# Patient Record
Sex: Male | Born: 1980
Health system: Southern US, Community
[De-identification: ages and names within clinical notes are randomized; demographics above are authoritative.]

## PROBLEM LIST (undated history)

## (undated) DIAGNOSIS — B2790 Infectious mononucleosis, unspecified without complication: Secondary | ICD-10-CM

## (undated) DIAGNOSIS — F430 Acute stress reaction: Secondary | ICD-10-CM

## (undated) DIAGNOSIS — R251 Tremor, unspecified: Secondary | ICD-10-CM

## (undated) DIAGNOSIS — M6289 Other specified disorders of muscle: Secondary | ICD-10-CM

## (undated) DIAGNOSIS — E663 Overweight: Secondary | ICD-10-CM

## (undated) DIAGNOSIS — I493 Ventricular premature depolarization: Secondary | ICD-10-CM

## (undated) DIAGNOSIS — R739 Hyperglycemia, unspecified: Secondary | ICD-10-CM

## (undated) DIAGNOSIS — K4090 Unilateral inguinal hernia, without obstruction or gangrene, not specified as recurrent: Secondary | ICD-10-CM

## (undated) DIAGNOSIS — E782 Mixed hyperlipidemia: Secondary | ICD-10-CM

## (undated) DIAGNOSIS — F411 Generalized anxiety disorder: Secondary | ICD-10-CM

## (undated) DIAGNOSIS — K5900 Constipation, unspecified: Secondary | ICD-10-CM

## (undated) DIAGNOSIS — R161 Splenomegaly, not elsewhere classified: Secondary | ICD-10-CM

## (undated) DIAGNOSIS — M545 Low back pain: Secondary | ICD-10-CM

## (undated) HISTORY — DX: Splenomegaly, not elsewhere classified: R16.1

## (undated) HISTORY — DX: Mixed hyperlipidemia: E78.2

## (undated) HISTORY — PX: NO PAST SURGERIES: SHX2092

## (undated) HISTORY — DX: Low back pain: M54.5

## (undated) HISTORY — DX: Overweight: E66.3

## (undated) HISTORY — DX: Infectious mononucleosis, unspecified without complication: B27.90

## (undated) HISTORY — DX: Hyperglycemia, unspecified: R73.9

## (undated) HISTORY — DX: Unilateral inguinal hernia, without obstruction or gangrene, not specified as recurrent: K40.90

## (undated) HISTORY — DX: Tremor, unspecified: R25.1

## (undated) HISTORY — DX: Acute stress reaction: F43.0

## (undated) HISTORY — DX: Generalized anxiety disorder: F41.1

## (undated) HISTORY — DX: Constipation, unspecified: K59.00

---
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
## 2005-04-22 ENCOUNTER — Encounter: Admission: RE | Admit: 2005-04-22 | Discharge: 2005-04-22 | Payer: Self-pay | Admitting: Gastroenterology

## 2009-02-21 ENCOUNTER — Emergency Department (HOSPITAL_BASED_OUTPATIENT_CLINIC_OR_DEPARTMENT_OTHER): Admission: EM | Admit: 2009-02-21 | Discharge: 2009-02-21 | Payer: Self-pay | Admitting: Emergency Medicine

## 2009-07-14 ENCOUNTER — Emergency Department (HOSPITAL_BASED_OUTPATIENT_CLINIC_OR_DEPARTMENT_OTHER)
Admission: EM | Admit: 2009-07-14 | Discharge: 2009-07-14 | Payer: Self-pay | Source: Home / Self Care | Admitting: Emergency Medicine

## 2009-07-14 ENCOUNTER — Ambulatory Visit: Payer: Self-pay | Admitting: Diagnostic Radiology

## 2010-03-25 ENCOUNTER — Encounter: Payer: Self-pay | Admitting: Family

## 2010-03-27 ENCOUNTER — Emergency Department (HOSPITAL_BASED_OUTPATIENT_CLINIC_OR_DEPARTMENT_OTHER)
Admission: EM | Admit: 2010-03-27 | Discharge: 2010-03-27 | Payer: Self-pay | Source: Home / Self Care | Admitting: Emergency Medicine

## 2010-04-09 ENCOUNTER — Ambulatory Visit
Admission: RE | Admit: 2010-04-09 | Discharge: 2010-04-09 | Payer: Self-pay | Source: Home / Self Care | Attending: Family | Admitting: Family

## 2010-04-09 ENCOUNTER — Encounter: Payer: Self-pay | Admitting: Family

## 2010-04-09 DIAGNOSIS — M6289 Other specified disorders of muscle: Secondary | ICD-10-CM | POA: Insufficient documentation

## 2010-04-09 DIAGNOSIS — N411 Chronic prostatitis: Secondary | ICD-10-CM | POA: Insufficient documentation

## 2010-04-09 DIAGNOSIS — R030 Elevated blood-pressure reading, without diagnosis of hypertension: Secondary | ICD-10-CM | POA: Insufficient documentation

## 2010-04-09 DIAGNOSIS — I4949 Other premature depolarization: Secondary | ICD-10-CM | POA: Insufficient documentation

## 2010-04-17 NOTE — Assessment & Plan Note (Signed)
Summary: new to est/fed bcbs/pkg/ss--rm 5   Vital Signs:  Patient profile:   30 year old male Height:      67.5 inches Weight:      173.50 pounds BMI:     26.87 Temp:     97.9 degrees F oral Pulse rate:   84 / minute Pulse rhythm:   regular Resp:     20 per minute BP sitting:   124 / 96  (right arm) Cuff size:   regular  Vitals Entered By: Mervin Kung CMA Duncan Dull) (April 09, 2010 1:47 PM) CC: Pt new to establish care. , Hypertension Management Is Patient Diabetic? No Pain Assessment Patient in pain? yes        CC:  Pt new to establish care.  and Hypertension Management.  History of Present Illness: Jared Myers is a 30 year old male who presents today to establish care.  Was seen by Dr. Cyndia Diver at [facility]. He has several issues that he wishes to discuss.  1) Chronic Prostatitis- Has followed with Urology- initially saw [facility].  Then went to [facility].  Continues to have pain, pain during intercourse.  Pain starts in the left testicle, then radiates to the right.  +dysuria.   Had cystoscopy and told that he may have had a kidney stone.  Had a CT scan which noted cyst on his kidney.  Has had intermittent fever and chills.  Initial prostatitis was in 2007, then again this past spring.  Has been on cipro, and bactrim, doxycycline.  Currently on avodart/cipro. Has upcoming appointment at [facility] tomorrow which he is looking forward to.  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     2) HA's-  have improved since last year.  Reports that these headaches are tension like.    3) Heart Arrythmia-  reports + history of PVC's.  Sometimes feels associated palpitations.  He has seen cardiology in the past.    4) Elevated blood pressures-  this has been in the last few weeks.  Last week was 149/100.    [facility] records were reviewed today.  Hypertension History:      Negative major cardiovascular risk factors  include male age less than 81 years old and non-tobacco-user status.     Preventive Screening-Counseling & Management  Alcohol-Tobacco     Alcohol drinks/day: 0     Smoking Status: never  Caffeine-Diet-Exercise     Caffeine use/day: 2 glasses daily     Does Patient Exercise: yes     Type of exercise: treadmill, elliptical     Times/week: 3      Drug Use:  no.    Allergies (verified): 1)  ! 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 Resp: recent episode of bronchitis CV:  Denies Chest Pain GI:  Denies nausea or vomitting or diarrhea GU: see HPI Lymphatic: Denies lymphadenopathy Musculoskeletal: Some hip pain due to one hip being higher than the other Skin:  Denies Rashes or concerning lesions Psychiatric: Denies depression  Neuro: Denies numbness     Physical Exam  General:  Well-developed,well-nourished,in no acute distress; alert,appropriate and cooperative throughout examination Head:  Normocephalic and atraumatic without obvious abnormalities. No apparent alopecia or balding. Neck:  No deformities, masses, or tenderness noted. Lungs:  Normal respiratory effort, chest expands  symmetrically. Lungs are clear to auscultation, no crackles or wheezes. Heart:  Normal rate and regular rhythm. S1 and S2 normal without gallop, murmur, click, rub or other extra sounds. Prostate:  deferred to Urology Extremities:  no peripheral edema Psych:  Cognition and judgment appear intact. Alert and cooperative with normal attention span and concentration. No apparent delusions, illusions, hallucinations   Impression & Recommendations:  Problem # 1:  PROSTATITIS, CHRONIC (ICD-601.1) Assessment Unchanged This has been a chronic issue for this patient and he is appropriately frustrated with his ongoing symptoms.  He has an appointment scheduled to see Urology at [facility].  Will defer management to Urology.  Problem # 2:  ELEVATED BLOOD PRESSURE WITHOUT DIAGNOSIS OF HYPERTENSION (ICD-796.2) Assessment: New Patient was counseled on importance of a low sodium diet and hand outs were provided.  Plan to repeat in 1 month.  If still elevated, will initiate med. therapy.   Problem # 3:  PREMATURE VENTRICULAR CONTRACTIONS, FREQUENT (ICD-427.69) Assessment: Comment Only Reports that he has been evaluated by cardiology in the past.  EKG today shows NSR. Monitor.  Complete Medication List: 1)  Avodart 0.5 Mg Caps (Dutasteride) .... Take 1 capsule by mouth once a day. 2)  Cipro 500 Mg Tabs (Ciprofloxacin hcl) .... Take 1 tablet by mouth two times a day. 3)  Benzonatate 100 Mg Caps (Benzonatate) .... Take 1 capsule by mouth three times a day. 4)  Daily Multi Tabs (Multiple vitamins-minerals) .... Take 1 tablet by mouth once a day. 5)  Saw Palmetto Xtra Caps (Misc natural products) .... Take 1 capsule by mouth once a day.  Other Orders: EKG w/ Interpretation (93000)  Hypertension Assessment/Plan:      The patient's hypertensive risk group is category A: No risk factors and no target organ damage.  Today's blood pressure is 124/96.    Patient Instructions: 1)  Please limit your  sodium intake. 2)  Please follow up in 1 month for a BP check and complete physical. Come fasting to this appointment.    Orders Added: 1)  EKG w/ Interpretation [93000] 2)  New Patient Level III [99203]   Immunization History:  Tetanus/Td Immunization History:    Tetanus/Td:  tdap (07/12/2009)   Immunization History:  Tetanus/Td Immunization History:    Tetanus/Td:  Tdap (07/12/2009)  Current Allergies (reviewed today): ! [facility]   Preventive Care Screening     Pt unsure of last tetanus shot. Nicki Guadalajara Fergerson CMA Duncan Dull)  April 09, 2010 2:02 PM

## 2010-04-25 NOTE — Letter (Signed)
Summary: Records from [facility]  Records from [facility]   Imported By: Maryln Gottron 04/18/2010 10:27:23  _____________________________________________________________________  External Attachment:    Type:   Image     Comment:   External Document

## 2010-05-08 ENCOUNTER — Ambulatory Visit: Payer: Self-pay | Admitting: Family

## 2010-05-10 ENCOUNTER — Encounter: Payer: Self-pay | Admitting: Family

## 2010-05-10 ENCOUNTER — Ambulatory Visit (INDEPENDENT_AMBULATORY_CARE_PROVIDER_SITE_OTHER): Payer: Federal, State, Local not specified - PPO | Admitting: Family

## 2010-05-10 DIAGNOSIS — R29818 Other symptoms and signs involving the nervous system: Secondary | ICD-10-CM | POA: Insufficient documentation

## 2010-05-10 DIAGNOSIS — IMO0002 Reserved for concepts with insufficient information to code with codable children: Secondary | ICD-10-CM

## 2010-05-10 DIAGNOSIS — R030 Elevated blood-pressure reading, without diagnosis of hypertension: Secondary | ICD-10-CM

## 2010-05-13 ENCOUNTER — Encounter: Payer: Self-pay | Admitting: Family

## 2010-05-13 ENCOUNTER — Telehealth: Payer: Self-pay | Admitting: Family

## 2010-05-13 ENCOUNTER — Encounter (INDEPENDENT_AMBULATORY_CARE_PROVIDER_SITE_OTHER): Payer: Federal, State, Local not specified - PPO | Admitting: Family

## 2010-05-13 DIAGNOSIS — Z0000 Encounter for general adult medical examination without abnormal findings: Secondary | ICD-10-CM

## 2010-05-13 DIAGNOSIS — J029 Acute pharyngitis, unspecified: Secondary | ICD-10-CM | POA: Insufficient documentation

## 2010-05-13 DIAGNOSIS — R030 Elevated blood-pressure reading, without diagnosis of hypertension: Secondary | ICD-10-CM

## 2010-05-13 LAB — CONVERTED CEMR LAB: Rapid Strep: NEGATIVE

## 2010-05-16 NOTE — Assessment & Plan Note (Signed)
Summary: 1 month follow up/mhf--rm 5   Vital Signs:  Patient profile:   30 year old male Height:      67.5 inches Weight:      175 pounds BMI:     27.10 Temp:     98.3 degrees F oral Pulse rate:   96 / minute Pulse rhythm:   regular Resp:     18 per minute BP sitting:   128 / 80  (right arm) Cuff size:   large  Vitals Entered By: Mervin Kung CMA Duncan Dull) (May 10, 2010 3:35 PM) CC: Pt here for 1 month follow up. Has noticed some epigastric burning since this morning. Is Patient Diabetic? No Pain Assessment Patient in pain? no      Comments Pt states all meds have been discontinued. Pt diagnosed with pelvic floor dysfunction. Nicki Guadalajara Fergerson CMA Duncan Dull)  May 10, 2010 3:40 PM    CC:  Pt here for 1 month follow up. Has noticed some epigastric burning since this morning.Marland Kitchen  History of Present Illness: Mr.  Delph is a 30 year old male who presents today for follow up of his blood pressure.  He also complains of some right sided chest pain today.  1. HTN-  still eating fast food. No formal exercise, but has been rollerskating regularly.   2. Prostatitis- told by Dr.  Cherly Anderson at [facility] (urology), that he does not have prostatitis, but instead has pelvic floor dysfunction- all meds stopped.   Was told to take hot baths and breathing exercises.  Has f/u to re-evaluate stream of urine.   3. right upper chest tenderness, tender on both sides.   2-3/10 burning pain. Started this AM.   Denies associates sob, nausea.  Allergies: 1)  ! 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 Lungs are clear to auscultation, no crackles or wheezes. Heart:  Normal rate and regular rhythm. S1 and S2 normal without gallop, murmur, click, rub or other extra sounds. Psych:  Cognition and judgment appear intact. Alert and cooperative with normal attention span and concentration. No apparent delusions, illusions, hallucinations   Impression & Recommendations:  Problem # 1:  ELEVATED BLOOD PRESSURE WITHOUT DIAGNOSIS OF HYPERTENSION (ICD-796.2) Assessment Improved Pt was counseled on healthy diet and exercise.  Monitor. BP today: 128/80 Prior BP: 124/96 (04/09/2010)  Prior 10 Yr Risk Heart Disease: Not enough information (04/09/2010)  Instructed in low sodium diet (DASH Handout) and behavior modification.    Problem # 2:  GENITOURINARY DISORDER (ICD-599.9) Assessment: Improved Pelvic floor dysfunction per urology.  Defer management to Dr. Gala Lewandowsky.  Problem # 3:  MUSCULOSKELETAL PAIN (ICD-781.99) Assessment: New Suspect muscle strain, recommended short term NSAIDS.  Patient Instructions: 1)  Please schedule an appointment at your convenience for a complete physical.   2)  Take 400-600mg  of Ibuprofen (Advil, Motrin) with food every 4-6 hours as needed for relief of pain.   Orders Added: 1)  Est. Patient Level III [16109]     Current Allergies (reviewed  today): ! [facility]

## 2010-05-21 NOTE — Progress Notes (Signed)
----   Converted from flag ---- ---- 05/13/2010 11:07 AM, Mervin Kung CMA (AAMA) wrote: Order entered and faxed to the lab.  ---- 05/13/2010 9:17 AM, Lemont Fillers FNP wrote: could you pls send the following orders to the lab- he will return fasting; CBC, BMET, LFT, FLP, TSH (v70) ------------------------------

## 2010-05-21 NOTE — Assessment & Plan Note (Signed)
Summary: CPE/MHF--rm 5   Vital Signs:  Patient profile:   30 year old male Height:      67.5 inches Weight:      174.75 pounds BMI:     27.06 Temp:     97.9 degrees F oral Pulse rate:   66 / minute Pulse rhythm:   regular Resp:     16 per minute BP sitting:   120 / 90  (right arm) Cuff size:   large  Vitals Entered By: Mervin Kung CMA Duncan Dull) (May 13, 2010 8:56 AM) CC: Pt here for physical; non fasting. Pt also has sore throat x 2 days. Is Patient Diabetic? No Pain Assessment Patient in pain? no        Primary Care Provider:  Lemont Fillers FNP  CC:  Pt here for physical; non fasting. Pt also has sore throat x 2 days.Marland Kitchen  History of Present Illness: Jared Myers is a 30 year old male who presents today for a complete physical.    Preventative- tetanus last spring.  Rollerskates 2x a week, also started using a treadmill.  Diet is improving.    Sore throat- Denies associated fever.  Started yesterday AM, denies associated runny nose.  Having pelvic pain.  Pelvic floor dysfunction-  looking into looking into a therapist who specializes in treating pelvic floor dysfunction.  Notes that today he is experiencing more pain than usual.    Preventive Screening-Counseling & Management  Alcohol-Tobacco     Alcohol drinks/day: 0     Smoking Status: never  Caffeine-Diet-Exercise     Caffeine use/day: 2 glasses daily     Does Patient Exercise: yes     Type of exercise: treadmill, elliptical     Times/week: 3  Allergies: 1)  ! 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 Mother-- living; hx of non  hogkins lymphoma diagnosed at age 52 and breast cancer age 52 Father-- alive and well 1 sister-- depression 1/2  sister-- brain aneurysm  Social History: Reviewed history from 04/09/2010 and no changes required. 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, no children Never Smoked Alcohol use-no Drug use-no Regular exercise-yes  Review of Systems       Constitutional: Denies Fever ENT:  Denies nasal congestion, + sore throat Resp: +mild  cough CV:  Denies Chest Pain GI:  Denies nausea or vomitting GU: + pelvic pain in the prostate, mile burning with urination Lymphatic: Denies lymphadenopathy Musculoskeletal:  Denies muscle/joint pain Skin:  Denies Rashes Psychiatric: Denies depression or anxiety Neuro: Denies numbness     Physical Exam  General:  Well-developed,well-nourished,in no acute distress; alert,appropriate and cooperative throughout examination Head:  Normocephalic and atraumatic without obvious abnormalities. No apparent alopecia or balding. Eyes:  PERRLA, sclera are clear Ears:  External ear exam shows no significant lesions or deformities.  Otoscopic examination reveals clear canals, tympanic membranes are intact bilaterally without bulging, retraction, inflammation or discharge. Hearing is grossly normal bilaterally. Mouth:  Oral mucosa and oropharynx without lesions or exudates.  Mild pharyngeal erythema. Neck:  No deformities, masses, or tenderness noted. Chest Wall:  No deformities, masses, tenderness or gynecomastia noted. Lungs:  Normal respiratory effort, chest expands symmetrically. Lungs are  clear to auscultation, no crackles or wheezes. Heart:  Normal rate and regular rhythm. S1 and S2 normal without gallop, murmur, click, rub or other extra sounds. Abdomen:  Bowel sounds positive,abdomen soft and non-tender without masses, organomegaly or hernias noted. Genitalia:  No palpable inguinal hernias, No penis lesions or urethral  discharge. Prostate:  deferred to urology Msk:  No deformity or scoliosis noted of thoracic or lumbar spine.   Extremities:  No clubbing, cyanosis, edema, or deformity noted with normal full range of motion of all joints.   Neurologic:  No cranial nerve deficits noted. Station and gait are normal. Plantar reflexes are down-going bilaterally. DTRs are symmetrical throughout. Sensory, motor and coordinative functions appear intact. Skin:  Intact without suspicious lesions or rashes Cervical Nodes:  No lymphadenopathy noted Psych:  Cognition and judgment appear intact. Alert and cooperative with normal attention span and concentration. No apparent delusions, illusions, hallucinations   Impression & Recommendations:  Problem # 1:  Preventive Health Care (ICD-V70.0) Assessment Comment Only Immunizations reviewed and up to date.  Pt counseled on diet and exercise.  Pt will return fasting to the lab for blood work this week.   Problem # 2:  ELEVATED BLOOD PRESSURE WITHOUT DIAGNOSIS OF HYPERTENSION (ICD-796.2) Assessment: Comment Only DBP up a bit today, however he is having pelvic pain.  Will monitor.    Problem # 3:  PHARYNGITIS, VIRAL (ICD-462) Assessment: New Rapid strep is negative.  Recommended supportive measures as outlined in pt instructions.  Other Orders: Rapid Strep (16109)  Patient Instructions: 1)  Gargle twice daily with salt water. 2)  Take Tylenol 650mg  every 6 hours as needed for pain 3)  You may use over the counter Cepacol lozenges or Chloraseptic spray as needed for sore throat. 4)  Call if your symptoms worsen or if they do not improve.  5)  Please return to the lab fasting this week to complete your fasting blood work.    Orders Added: 1)  Rapid Strep [60454] 2)  Est. Patient 18-39 years [99395] 3)  Est. Patient Level III [09811]    Current Allergies (reviewed today): ! [facility]   Laboratory Results   Date/Time Reported: Mervin Kung CMA Duncan Dull)   May 13, 2010 9:18 AM   Other Tests  Rapid Strep: negative  Kit Test Internal QC: Positive   (Normal Range: Negative)  Appended Document: lab order    Clinical Lists Changes  Orders: Added new Test order of T-Basic Metabolic Panel [phone]) - Signed Added new Test order of T-CBC w/Diff [phone]) - Signed Added new Test order of T-Hepatic Function [phone]) - Signed Added new Test order of T-Lipid Profile [phone]) - Signed Added new Test order of T-TSH ([id number]) - Signed

## 2010-06-11 LAB — URINALYSIS, ROUTINE W REFLEX MICROSCOPIC
Bilirubin Urine: NEGATIVE
Glucose, UA: NEGATIVE mg/dL
Hgb urine dipstick: NEGATIVE
Ketones, ur: NEGATIVE mg/dL
Nitrite: NEGATIVE
Protein, ur: NEGATIVE mg/dL
Specific Gravity, Urine: 1.025 (ref 1.005–1.030)
Urobilinogen, UA: 0.2 mg/dL (ref 0.0–1.0)
pH: 5 (ref 5.0–8.0)

## 2010-06-21 ENCOUNTER — Emergency Department (HOSPITAL_BASED_OUTPATIENT_CLINIC_OR_DEPARTMENT_OTHER)
Admission: EM | Admit: 2010-06-21 | Discharge: 2010-06-21 | Disposition: A | Discharge status: Home / Self Care | Payer: Federal, State, Local not specified - PPO | Attending: Emergency Medicine | Admitting: Emergency Medicine

## 2010-06-21 ENCOUNTER — Telehealth: Payer: Self-pay | Admitting: *Deleted

## 2010-06-21 DIAGNOSIS — I10 Essential (primary) hypertension: Secondary | ICD-10-CM | POA: Insufficient documentation

## 2010-06-21 DIAGNOSIS — R0789 Other chest pain: Secondary | ICD-10-CM | POA: Insufficient documentation

## 2010-06-21 LAB — BASIC METABOLIC PANEL
CO2: 26 mEq/L (ref 19–32)
Calcium: 9.5 mg/dL (ref 8.4–10.5)
GFR calc Af Amer: >60 mL/min (ref >60)
Glucose, Bld: 96 mg/dL (ref 70–99)
Potassium: 4 mEq/L (ref 3.5–5.1)
Sodium: 144 mEq/L (ref 135–145)

## 2010-06-21 NOTE — Telephone Encounter (Signed)
Received call from pt stating he has been under a lot of stress at work this week. Started having chest pain last night. Has chest pain today with shortness of breath, dizziness and numbness and tingling in his left arm. Pt wanted appt to be seen in the office. Advised pt he should be evaluated in the ER immediately. Pt voices understanding.

## 2010-07-22 ENCOUNTER — Encounter: Payer: Self-pay | Admitting: Family

## 2010-07-22 ENCOUNTER — Ambulatory Visit (HOSPITAL_BASED_OUTPATIENT_CLINIC_OR_DEPARTMENT_OTHER)
Admission: RE | Admit: 2010-07-22 | Discharge: 2010-07-22 | Disposition: A | Discharge status: Home / Self Care | Payer: Federal, State, Local not specified - PPO | Source: Ambulatory Visit | Attending: Family | Admitting: Family

## 2010-07-22 ENCOUNTER — Ambulatory Visit (INDEPENDENT_AMBULATORY_CARE_PROVIDER_SITE_OTHER): Payer: Federal, State, Local not specified - PPO | Admitting: Family

## 2010-07-22 DIAGNOSIS — R109 Unspecified abdominal pain: Secondary | ICD-10-CM | POA: Insufficient documentation

## 2010-07-22 DIAGNOSIS — R030 Elevated blood-pressure reading, without diagnosis of hypertension: Secondary | ICD-10-CM

## 2010-07-22 MED ORDER — IOHEXOL 300 MG/ML  SOLN
100.0000 mL | Freq: Once | INTRAMUSCULAR | Status: AC | PRN
  Administered 2010-07-22: 100 mL via INTRAVENOUS

## 2010-07-22 NOTE — Assessment & Plan Note (Addendum)
BP Readings from Last 3 Encounters:  07/22/10 130/90  05/13/10 120/90  05/10/10 128/80   Pt's bp today is acceptable.  Will continue to monitor.

## 2010-07-22 NOTE — Patient Instructions (Signed)
Please complete your CT downstairs.  We will contact you with the results.

## 2010-07-22 NOTE — Progress Notes (Signed)
  Subjective:    Patient ID: Jared Myers, male    DOB: 05/19/1980, 30 y.o.   MRN: [id number]  HPI  Mr.  Myers is a 30 yr old male who presents today with cc of left lower abdominal pain.  Pain starts in the left lower and radiates downward.  This pain has been present for months.   He continues to roller skate.  He notes a "tiny knot" on his left lower abdomen.  Pain is made worse by standing.  Pain is improved by sitting/laying.  Denies associated nausea or vomitting or constipation.  He continues PT for pelvic floor dysfunction.  Review of Systems See HPI  No past medical history on file.  History   Social History  . Marital Status: Married    Spouse Name: N/A    Number of Children: N/A  . Years of Education: N/A   Occupational History  . Not on file.   Social History Main Topics  . Smoking status: Never Smoker   . Smokeless tobacco: Not on file  . Alcohol Use: Not on file  . Drug Use: Not on file  . Sexually Active: Not on file   Other Topics Concern  . Not on file   Social History Narrative  . No narrative on file    No past surgical history on file.  No family history on file.  Allergies  Allergen Reactions  . Guaifenesin     REACTION: PVCs    No current outpatient prescriptions on file prior to visit.    BP 130/90  Pulse 72  Temp(Src) 97.9 F (36.6 C) (Oral)  Resp 16  Ht 5' 7.5" (1.715 m)  Wt 171 lb 1.3 oz (77.601 kg)  BMI 26.40 kg/m2       Objective:   Physical Exam Gen:  Awake, alert, NAD Abd: soft, non-distended.  + tenderness to palpation periumbilical and RLQ.   GU- ? Direct hernia right, (L side is normal) no palpable indirect hernias        Assessment & Plan:

## 2010-07-22 NOTE — Assessment & Plan Note (Addendum)
Diffuse abdominal pain, ? Hernia on right.  Reviewed radiology records at [facility]- last CT on record was back in 2007.  Will have patient complete a CT abdomen and pelvis to further evaluate.  Surgery referral if + hernia.

## 2010-07-23 ENCOUNTER — Telehealth: Payer: Self-pay | Admitting: *Deleted

## 2010-07-23 NOTE — Telephone Encounter (Signed)
Spoke with patient, reviewed results of CT- subtle asymmetric increased fat within the right inguinal canal which may indicate a small indirect inguinal hernia.  This finding seems disproportionate to his pain which is periumbilical and left sided.  He is not currently experiencing any right groin pain.  He wishes to hold off on any surgical referral at this time which I think is reasonable.  He will let me know if he develops right sided groin pain or if he changes his mind.  I told him that his nausea is likely related to the contrast and should subside in the next 24 hours.  He will contact us if symptoms do not improve.

## 2010-07-23 NOTE — Telephone Encounter (Signed)
Pt states he has been nauseated since his CT scan yesterday. Was not having this before the scan. Wants to know if this is a normal reaction to the Oral contrast or the IV contrast? Reports that he still has nausea today. Please advise.

## 2010-09-23 ENCOUNTER — Encounter: Payer: Self-pay | Admitting: Family

## 2010-09-23 ENCOUNTER — Ambulatory Visit (INDEPENDENT_AMBULATORY_CARE_PROVIDER_SITE_OTHER): Payer: Federal, State, Local not specified - PPO | Admitting: Family

## 2010-09-23 DIAGNOSIS — K1370 Unspecified lesions of oral mucosa: Secondary | ICD-10-CM

## 2010-09-23 DIAGNOSIS — K121 Other forms of stomatitis: Secondary | ICD-10-CM

## 2010-09-23 NOTE — Assessment & Plan Note (Signed)
Small oral ulcer- likely viral in etiology.  Ears and throat look good. Conservative measures planned as outlined in pt instructions.

## 2010-09-23 NOTE — Progress Notes (Signed)
  Subjective:    Patient ID: Jared Myers, male    DOB: [birth year], 30 y.o.   MRN: [id number]  HPI  [name] is a 30 yr old male who presents today with chief complaint of sore on his tongue and some left sided ear pain.  Reports recent exposure to sick nephew who had viral illness. Wife has been sick- had whooping cough 3 weeks ago.  Notes + ulcer on the side of his tongue.  + associate pain into the left ear.  Denies associated fever or nasal congestion.  Has not tired any otc meds.    Review of Systems See HPI No past medical history on file.  History   Social History  . Marital Status: Married    Spouse Name: N/A    Number of Children: N/A  . Years of Education: N/A   Occupational History  . Not on file.   Social History Main Topics  . Smoking status: Never Smoker   . Smokeless tobacco: Not on file  . Alcohol Use: Not on file  . Drug Use: Not on file  . Sexually Active: Not on file   Other Topics Concern  . Not on file   Social History Narrative  . No narrative on file    No past surgical history on file.  No family history on file.  Allergies  Allergen Reactions  . Guaifenesin     REACTION: PVCs    No current outpatient prescriptions on file prior to visit.    BP 110/80  Pulse 78  Temp(Src) 98.1 F (36.7 C) (Oral)  Resp 16  Ht 5' 7.5" (1.715 m)  Wt 175 lb 0.6 oz (79.398 kg)  BMI 27.01 kg/m2       Objective:   Physical Exam  Constitutional: He appears well-developed and well-nourished.  HENT:  Head: Normocephalic and atraumatic.  Right Ear: Tympanic membrane and ear canal normal.  Left Ear: Tympanic membrane and ear canal normal.       Small ulcer at back of tongue on left lateral side.  Tonsils without exudates or erythema.   Eyes: Conjunctivae are normal.  Neck: Neck supple. No erythema present. No mass and no thyromegaly present.       No carotid LAD noted   Cardiovascular: Normal rate and regular rhythm.   Pulmonary/Chest: Effort  normal and breath sounds normal.  Psychiatric: He has a normal mood and affect.          Assessment & Plan:

## 2010-09-23 NOTE — Patient Instructions (Signed)
Gargle with salt water twice daily. You may use motrin or chloraseptic spray as needed for throat pain. Call if your symptoms worsen or if they do not improve in the next 48 to 72 hours.

## 2010-11-18 ENCOUNTER — Ambulatory Visit: Payer: Federal, State, Local not specified - PPO | Admitting: Family

## 2010-11-18 ENCOUNTER — Ambulatory Visit (INDEPENDENT_AMBULATORY_CARE_PROVIDER_SITE_OTHER): Payer: Federal, State, Local not specified - PPO | Admitting: Family

## 2010-11-18 ENCOUNTER — Encounter: Payer: Self-pay | Admitting: Family

## 2010-11-18 VITALS — BP 110/80 | HR 66 | Temp 98.0°F | Resp 16 | Ht 67.5 in | Wt 178.0 lb

## 2010-11-18 DIAGNOSIS — Z23 Encounter for immunization: Secondary | ICD-10-CM

## 2010-11-18 DIAGNOSIS — S3992XA Unspecified injury of lower back, initial encounter: Secondary | ICD-10-CM

## 2010-11-18 DIAGNOSIS — IMO0002 Reserved for concepts with insufficient information to code with codable children: Secondary | ICD-10-CM

## 2010-11-18 DIAGNOSIS — R030 Elevated blood-pressure reading, without diagnosis of hypertension: Secondary | ICD-10-CM

## 2010-11-18 NOTE — Patient Instructions (Signed)
Please follow up in the end of March for a physical, sooner if problems or concerns.

## 2010-11-18 NOTE — Progress Notes (Signed)
  Subjective:    Patient ID: Jared Myers, male    DOB: 08-30-1980, 30 y.o.   MRN: [id number]  HPI  Mr.  Auriemma is a 30 yr old male who presents today for follow up of his blood pressure.  Elevated blood pressure- since last visit he notes that he has started running every other day.  In addition, he reports that he has been watching his sodium.  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  He finds this less stressful than his previous assignment.  In addition, he is not required to do as much heavy lifting as before.  As a result his pelvic floor issues have settled down.   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  He did have a fall landing on his tailbone about 6 weeks ago which is overall improving. He has been seeing a chiropractor for this.   Review of Systems See HPI  No past medical history on file.  History   Social History  . Marital Status: Married    Spouse Name: N/A    Number of Children: N/A  . Years of Education: N/A   Occupational History  . Not on file.   Social History Main Topics  . Smoking status: Never Smoker   . Smokeless tobacco: Not on file  . Alcohol Use: Not on file  . Drug Use: Not on file  . Sexually Active: Not on file   Other Topics Concern  . Not on file   Social History Narrative  . No narrative on file    No past surgical history on file.  No family history on file.  Allergies  Allergen Reactions  . Guaifenesin     REACTION: PVCs    No current outpatient prescriptions on file prior to visit.    BP 110/80  Pulse 66  Temp(Src) 98 F (36.7 C) (Oral)  Resp 16  Ht 5' 7.5" (1.715 m)  Wt 178 lb (80.74 kg)  BMI 27.47 kg/m2       Objective:   Physical Exam  Constitutional: He appears well-developed and well-nourished.  Cardiovascular: Normal rate and regular rhythm.   Pulmonary/Chest: Effort normal and breath sounds normal.    Musculoskeletal: He exhibits no edema.       Mild tenderness to palpation of left lower spine in sacral area.           Assessment & Plan:

## 2010-11-19 DIAGNOSIS — S3992XA Unspecified injury of lower back, initial encounter: Secondary | ICD-10-CM | POA: Insufficient documentation

## 2010-11-19 NOTE — Assessment & Plan Note (Signed)
BP is improved, not currently on meds.  Recommended that he continue the low sodium diet and exercise.

## 2010-11-19 NOTE — Assessment & Plan Note (Signed)
Clinically improving.  I offered to xray the area to confirm if fractured, but he declined at this time.  "I'll just let it be."

## 2010-11-22 ENCOUNTER — Ambulatory Visit: Payer: Federal, State, Local not specified - PPO | Admitting: Family

## 2010-11-25 ENCOUNTER — Ambulatory Visit (INDEPENDENT_AMBULATORY_CARE_PROVIDER_SITE_OTHER): Payer: Federal, State, Local not specified - PPO | Admitting: Family

## 2010-11-25 ENCOUNTER — Other Ambulatory Visit: Payer: Self-pay

## 2010-11-25 ENCOUNTER — Emergency Department (HOSPITAL_BASED_OUTPATIENT_CLINIC_OR_DEPARTMENT_OTHER)
Admission: EM | Admit: 2010-11-25 | Discharge: 2010-11-25 | Disposition: A | Discharge status: Home / Self Care | Payer: Federal, State, Local not specified - PPO | Attending: Emergency Medicine | Admitting: Emergency Medicine

## 2010-11-25 ENCOUNTER — Encounter (HOSPITAL_BASED_OUTPATIENT_CLINIC_OR_DEPARTMENT_OTHER): Payer: Self-pay | Admitting: *Deleted

## 2010-11-25 ENCOUNTER — Encounter: Payer: Self-pay | Admitting: Family

## 2010-11-25 DIAGNOSIS — R002 Palpitations: Secondary | ICD-10-CM | POA: Insufficient documentation

## 2010-11-25 DIAGNOSIS — I493 Ventricular premature depolarization: Secondary | ICD-10-CM | POA: Insufficient documentation

## 2010-11-25 DIAGNOSIS — J40 Bronchitis, not specified as acute or chronic: Secondary | ICD-10-CM | POA: Insufficient documentation

## 2010-11-25 DIAGNOSIS — R079 Chest pain, unspecified: Secondary | ICD-10-CM | POA: Insufficient documentation

## 2010-11-25 DIAGNOSIS — IMO0002 Reserved for concepts with insufficient information to code with codable children: Secondary | ICD-10-CM

## 2010-11-25 HISTORY — DX: Ventricular premature depolarization: I49.3

## 2010-11-25 MED ORDER — ONDANSETRON 8 MG PO TBDP
8.0000 mg | ORAL_TABLET | Freq: Once | ORAL | Status: AC
  Administered 2010-11-25: 8 mg via ORAL
  Filled 2010-11-25: qty 1

## 2010-11-25 NOTE — ED Notes (Addendum)
EKG completed and shown to EDP

## 2010-11-25 NOTE — Assessment & Plan Note (Signed)
Patient with history of pelvic floor dysfunction.  He is starting a physical therapy program at [facility] in [place].  Symptoms seem to be made worse by stress.  We discussed that if he does not see improvement with physical therapy that we may consider focusing more on treatment of his stress and anxiety.

## 2010-11-25 NOTE — ED Provider Notes (Addendum)
History     CSN: [id number] Arrival date & time: 11/25/2010 12:41 AM   Chief Complaint  Patient presents with  . Chest Pain  . Palpitations      HPI 30 year old male presents to emergency department with complaint of palpitations and chest tightness tonight around 1045. Patient reports he was lying in the bed when he had onset of the symptoms. Patient reports he's had similar symptoms with frequent PVCs when taking prednisone. Patient was seen earlier today by local urgent care started on a prednisone Dosepak and Cefdinir for symptoms of nasal congestion, sinus pressure for the past week. Patient denies fever cough or purulent drainage. He reports symptoms have resolved since being in the emergency department. Patient denies cardiac history aside from previous history of PVCs, patient is nonsmoker. No leg swelling, no shortness of breath. Patient has some family members with cardiac disease but non-before the age of 8. Patient was told by a nurse line that he needed to get checked out so he came to the emergency department.  Past Medical History  Diagnosis Date  . Premature ventricular beat      History reviewed. No pertinent past surgical history.  History reviewed. No pertinent family history.  History  Substance Use Topics  . Smoking status: Never Smoker   . Smokeless tobacco: Not on file  . Alcohol Use: No      Review of Systems  All other systems reviewed and are negative.    Allergies  Guaifenesin and Prednisone  Home Medications   Current Outpatient Rx  Name Route Sig Dispense Refill  . CEFDINIR 300 MG PO CAPS Oral Take 300 mg by mouth 2 (two) times daily.        Physical Exam    BP 132/81  Pulse 78  Temp(Src) 97.9 F (36.6 C) (Oral)  Resp 18  Wt 175 lb (79.379 kg)  SpO2 100%  Physical Exam  Constitutional: He is oriented to person, place, and time. He appears well-developed and well-nourished. No distress.  HENT:  Head: Normocephalic and  atraumatic.  Right Ear: External ear normal.  Left Ear: External ear normal.  Nose: Nose normal.  Mouth/Throat: Oropharynx is clear and moist. No oropharyngeal exudate.  Eyes: Conjunctivae and EOM are normal. Pupils are equal, round, and reactive to light. Right eye exhibits no discharge. Left eye exhibits no discharge. No scleral icterus.  Neck: Normal range of motion. Neck supple. No JVD present. No tracheal deviation present. No thyromegaly present.  Cardiovascular: Normal rate, regular rhythm, normal heart sounds and intact distal pulses.  Exam reveals no gallop and no friction rub.   No murmur heard. Pulmonary/Chest: Effort normal and breath sounds normal. No stridor. No respiratory distress. He has no wheezes. He has no rales. He exhibits no tenderness.  Abdominal: Soft. Bowel sounds are normal. He exhibits no distension and no mass. There is no tenderness. There is no rebound and no guarding.  Musculoskeletal: Normal range of motion. He exhibits no edema and no tenderness.  Lymphadenopathy:    He has no cervical adenopathy.  Neurological: He is alert and oriented to person, place, and time. Coordination normal.  Skin: Skin is warm and dry. No rash noted. He is not diaphoretic. No erythema. No pallor.  Psychiatric: He has a normal mood and affect.    ED Course  Procedures  Results for orders placed during the hospital encounter of 06/21/10  BASIC METABOLIC PANEL      Component Value Range   Sodium 144  135 - 145 (mEq/L)   Potassium 4.0  3.5 - 5.1 (mEq/L)   Chloride 104  96 - 112 (mEq/L)   CO2 26  19 - 32 (mEq/L)   Glucose, Bld 96  70 - 99 (mg/dL)   BUN 15  6 - 23 (mg/dL)   Creatinine, Ser .9  0.4 - 1.5 (mg/dL)   Calcium 9.5  8.4 - 86.5 (mg/dL)   GFR calc non Af Amer >60  >60 (mL/min)   GFR calc Af Amer    >60 (mL/min)   Value: >60            The eGFR has been calculated     using the MDRD equation.     This calculation has not been     validated in all clinical      situations.     eGFR's persistently     <60 mL/min signify     possible Chronic Kidney Disease.   No results found.   Date: 11/25/2010  Rate: 83  Rhythm: normal sinus rhythm  QRS Axis: normal  Intervals: normal  ST/T Wave abnormalities: normal  Conduction Disutrbances:none  Narrative Interpretation:   Old EKG Reviewed: unchanged 06/21/10   1. Palpitations      MDM 30 year old male with chest tightness and palpitations earlier in the evening since resolved. Patient has been in the department on the monitor for 3 hours without return of symptoms. Exam normal. Patient reports prior history of palpitations while taking prednisone which he had forgotten. Advised patient to stop taking prednisone. Advised as he has started the antibiotics he should finish the course. Patient to follow up with primary care Dr. for worsening condition       Olivia Mackie, MD 11/25/10 7846  Olivia Mackie, MD 01/09/11 [phone]

## 2010-11-25 NOTE — Assessment & Plan Note (Signed)
Improving.  I recommended that he continue cefdinir (continue OFF of prednisone).  Call if symptoms worsen or if they do not continue to improve.  Pt verbalizes understanding.  ED records were reviewed.

## 2010-11-25 NOTE — ED Notes (Signed)
Pt was seen at [facility] today and given abx and prednisone pt forgot that he had a previous reaction to prednisone pt states that prednisone gives him PVC pt began to have left sided CP and nausea after taking 6 tablets tonight.

## 2010-11-25 NOTE — Patient Instructions (Signed)
Call if fever over 101,  If symptoms worsen, or if you are not feeling better in 2-3 days.

## 2010-11-25 NOTE — Progress Notes (Signed)
  Subjective:    Patient ID: Jared Myers, male    DOB: 09/17/1980, 30 y.o.   MRN: [id number]  HPI  Mr.  Myers is a 30 yr old male who presents today for ED follow up.    Was seen yesterday AM at the urgent care at 10:30AM where he was seen for chest congestion and was treated with cefdinir and prednisone.  After taking prednisone he developed chest palpitations.  He called the RN at [facility] and was told to go to the ED.  He was evaluated in the ED at [facility], found to be stable and discharged home.   Today he left work early.  He has some chest "achiness" but notes improvement overall in his cough.  Pelvic pain- he is following at the [facility], the pelvic pain seems to be worsening due to pain.      Review of Systems See HPI  Past Medical History  Diagnosis Date  . Premature ventricular beat     History   Social History  . Marital Status: Married    Spouse Name: N/A    Number of Children: N/A  . Years of Education: N/A   Occupational History  . Not on file.   Social History Main Topics  . Smoking status: Never Smoker   . Smokeless tobacco: Not on file  . Alcohol Use: No  . Drug Use: No  . Sexually Active: Not on file   Other Topics Concern  . Not on file   Social History Narrative  . No narrative on file    No past surgical history on file.  No family history on file.  Allergies  Allergen Reactions  . Guaifenesin     REACTION: PVCs  . Prednisone     PVC    Current Outpatient Prescriptions on File Prior to Visit  Medication Sig Dispense Refill  . cefdinir (OMNICEF) 300 MG capsule Take 300 mg by mouth 2 (two) times daily.         Current Facility-Administered Medications on File Prior to Visit  Medication Dose Route Frequency Provider Last Rate Last Dose  . ondansetron (ZOFRAN-ODT) disintegrating tablet 8 mg  8 mg Oral Once Olivia Mackie, MD   8 mg at 11/25/10 0201    BP 122/80  Pulse 84  Temp(Src) 97.9 F (36.6 C) (Oral)   Resp 16  Ht 5' 7.5" (1.715 m)  Wt 177 lb 0.6 oz (80.305 kg)  BMI 27.32 kg/m2       Objective:   Physical Exam  Constitutional: He is oriented to person, place, and time. He appears well-developed and well-nourished.  Cardiovascular: Normal rate and regular rhythm.   No murmur heard. Pulmonary/Chest: Effort normal and breath sounds normal. No respiratory distress. He has no wheezes. He has no rales. He exhibits no tenderness.  Neurological: He is alert and oriented to person, place, and time.  Skin: Skin is warm and dry.  Psychiatric: He has a normal mood and affect. His behavior is normal. Judgment and thought content normal.          Assessment & Plan:

## 2010-12-25 ENCOUNTER — Encounter: Payer: Self-pay | Admitting: Family

## 2010-12-25 ENCOUNTER — Ambulatory Visit (INDEPENDENT_AMBULATORY_CARE_PROVIDER_SITE_OTHER): Payer: Federal, State, Local not specified - PPO | Admitting: Family

## 2010-12-25 VITALS — BP 120/88 | HR 66 | Temp 97.8°F | Resp 16 | Wt 181.1 lb

## 2010-12-25 DIAGNOSIS — J029 Acute pharyngitis, unspecified: Secondary | ICD-10-CM

## 2010-12-25 NOTE — Assessment & Plan Note (Signed)
Recommended supportive measures.  Rapid strep is negative.

## 2010-12-25 NOTE — Progress Notes (Signed)
Addended by: Mervin Kung A on: 12/25/2010 02:26 PM   Modules accepted: Orders

## 2010-12-25 NOTE — Progress Notes (Signed)
  Subjective:    Patient ID: Jared Myers, male    DOB: Mar 21, 1980, 30 y.o.   MRN: [id number]  HPI  Mr. Mckamie is a 30 yr old male with chief complaint of sore throat. Symptoms started yesterday on the way home from his [address]  Notes mild associated cough, and nausea this AM without vomitting. He does report associated chills but no fever.  He had some sick contacts on the cruise ship. Review of Systems See HPI  Past Medical History  Diagnosis Date  . Premature ventricular beat     History   Social History  . Marital Status: Married    Spouse Name: N/A    Number of Children: N/A  . Years of Education: N/A   Occupational History  . Not on file.   Social History Main Topics  . Smoking status: Never Smoker   . Smokeless tobacco: Not on file  . Alcohol Use: No  . Drug Use: No  . Sexually Active: Not on file   Other Topics Concern  . Not on file   Social History Narrative  . No narrative on file    No past surgical history on file.  No family history on file.  Allergies  Allergen Reactions  . Guaifenesin     REACTION: PVCs  . Prednisone     PVC    Current Outpatient Prescriptions on File Prior to Visit  Medication Sig Dispense Refill  . MAGNESIUM PO Take 2 tablets by mouth daily.        . Multiple Vitamin (MULTIVITAMIN) tablet Take 1 tablet by mouth daily.        Marland Kitchen POTASSIUM PO Take 1 tablet by mouth daily.          BP 120/88  Pulse 66  Temp(Src) 97.8 F (36.6 C) (Oral)  Resp 16  Wt 181 lb 1.3 oz (82.137 kg)       Objective:   Physical Exam  Constitutional: He appears well-developed and well-nourished.  HENT:  Head: Normocephalic and atraumatic.  Right Ear: Tympanic membrane and ear canal normal.  Left Ear: Tympanic membrane and ear canal normal.       Mild pharyngeal erythema, no exudates  Eyes: Conjunctivae are normal. Pupils are equal, round, and reactive to light. No scleral icterus.  Cardiovascular: Normal rate and regular rhythm.    No murmur heard. Pulmonary/Chest: Effort normal and breath sounds normal. No respiratory distress. He has no wheezes. He has no rales.  Psychiatric: He has a normal mood and affect. His behavior is normal. Judgment and thought content normal.          Assessment & Plan:

## 2010-12-25 NOTE — Patient Instructions (Signed)
Viral Pharyngitis  Viral pharyngitis is a sore throat caused by a cold virus. This is not a strep throat. This does not require treatment by an antibiotic (medications that kill germs). It will get better on its own. Antibiotics generally will not help unless this condition is followed by a bacterial (germ) infection.  HOME CARE INSTRUCTIONS   Drink or give your child plenty of fluids. Soft, cold foods such as ice cream, popsicles, or jello may be used.    Gargle with warm salt water (one teaspoon salt per quart of water).    If over age 7, throat lozenges may be used safely.    Only take over-the-counter or prescription medicines for pain, discomfort, or fever as directed by your caregiver. DO NOT USE ASPIRIN.   SEEK MEDICAL CARE IF:   You are better in a few days, then become worse.    You have a fever or pain unresponsive to pain medicines.    There are any other changes that concern you.   Diagnostic tests may have been performed. If you have not received results at time of discharge, receive instructions as how to obtain these results.   Document Released: 12/04/2004 Document Re-Released: 08/13/2007  Exit[employer] Patient Information 2011 [employer].

## 2011-01-03 ENCOUNTER — Ambulatory Visit: Payer: Federal, State, Local not specified - PPO | Admitting: Family

## 2011-01-03 ENCOUNTER — Ambulatory Visit (INDEPENDENT_AMBULATORY_CARE_PROVIDER_SITE_OTHER): Payer: Federal, State, Local not specified - PPO | Admitting: Family

## 2011-01-03 ENCOUNTER — Encounter: Payer: Self-pay | Admitting: *Deleted

## 2011-01-03 ENCOUNTER — Encounter: Payer: Self-pay | Admitting: Family

## 2011-01-03 VITALS — BP 130/102 | HR 72 | Temp 97.8°F | Resp 16 | Wt 179.0 lb

## 2011-01-03 DIAGNOSIS — R0789 Other chest pain: Secondary | ICD-10-CM

## 2011-01-03 DIAGNOSIS — R030 Elevated blood-pressure reading, without diagnosis of hypertension: Secondary | ICD-10-CM

## 2011-01-03 DIAGNOSIS — F411 Generalized anxiety disorder: Secondary | ICD-10-CM

## 2011-01-03 DIAGNOSIS — F419 Anxiety disorder, unspecified: Secondary | ICD-10-CM

## 2011-01-03 NOTE — Progress Notes (Signed)
  Subjective:    Patient ID: Jared Myers, male    DOB: [birth year], 30 y.o.   MRN: [id number]  HPI  [name] present today for evaluation of his blood pressure.  Reports hostile work environment. Has been very stressed at his work.  He went for his physical therapy for his pelvic floor issues.  Notes that his pelvic pain has been worse yesterday and today. He has mild associated tingling left arm.  Mild associated nausea.  Yesterday and today he noted some chest tightness while he was laying in bed.  Felt that his blood pressure was elevated.  He notes some mild tightness at rest.  Not exacerbated by activity.  Has felt shaky.  He went to [facility] ED for BP evaluation and was told that his BP was 141/97.     Maternal GM- CAD MI at age 101.  Maternal uncle MI in his early 78's.  Review of Systems    see HPI  Past Medical History  Diagnosis Date  . Premature ventricular beat     History   Social History  . Marital Status: Married    Spouse Name: N/A    Number of Children: N/A  . Years of Education: N/A   Occupational History  . Not on file.   Social History Main Topics  . Smoking status: Never Smoker   . Smokeless tobacco: Not on file  . Alcohol Use: No  . Drug Use: No  . Sexually Active: Not on file   Other Topics Concern  . Not on file   Social History Narrative  . No narrative on file    No past surgical history on file.  No family history on file.  Allergies  Allergen Reactions  . Guaifenesin     REACTION: PVCs  . Prednisone     PVC    Current Outpatient Prescriptions on File Prior to Visit  Medication Sig Dispense Refill  . MAGNESIUM PO Take 2 tablets by mouth daily.          BP 130/102  Pulse 72  Temp(Src) 97.8 F (36.6 C) (Oral)  Resp 16  Wt 179 lb (81.194 kg)    Objective:   Physical Exam  Constitutional: He appears well-developed and well-nourished. No distress.  Cardiovascular: Normal rate and regular rhythm.   No murmur  heard. Pulmonary/Chest: Effort normal and breath sounds normal. No respiratory distress. He has no wheezes. He has no rales. He exhibits no tenderness.  Musculoskeletal: He exhibits no edema.  Skin: Skin is warm and dry. No erythema.  Psychiatric: He has a normal mood and affect. His behavior is normal. Judgment and thought content normal.          Assessment & Plan:

## 2011-01-03 NOTE — Patient Instructions (Signed)
Go to the ER if you develop worsening chest pain.  You will be contacted about your exercise stress test.   You will be contacted about your referral for to the therapist.   Follow up in 6 weeks.

## 2011-01-06 DIAGNOSIS — F411 Generalized anxiety disorder: Secondary | ICD-10-CM | POA: Insufficient documentation

## 2011-01-06 DIAGNOSIS — R0789 Other chest pain: Secondary | ICD-10-CM | POA: Insufficient documentation

## 2011-01-06 HISTORY — DX: Generalized anxiety disorder: F41.1

## 2011-01-06 NOTE — Assessment & Plan Note (Addendum)
30 yr old male with atypical chest pain.  EKG performed today in office and I have personally reviewed.  NSR without any ischemic changes.   I recommended that he have an exercise stress test which I suspect will be normal.  He is also instructed to go to the ED if he develops worsening chest pain.  He verbalizes understanding.

## 2011-01-06 NOTE — Assessment & Plan Note (Signed)
BP Readings from Last 3 Encounters:  01/03/11 130/102  12/25/10 120/88  11/25/10 122/80  I repeated his BP in the exam room after he had been sitting quietly and got reading of 124/82.  I suspect that stress and anxiety are playing a major role in his symptoms. Monitor for now.

## 2011-01-06 NOTE — Assessment & Plan Note (Signed)
His atypical chest pain and his pelvic floor pain are both exacerbated by his stress and anxiety.  I recommended that he see a therapist to help with stress management and anxiety.  He is agreeable to this.  If symptoms worsen or if no improvement with therapy, then I would consider adding an SRI.

## 2011-01-07 ENCOUNTER — Telehealth: Payer: Self-pay | Admitting: Family

## 2011-01-07 NOTE — Telephone Encounter (Signed)
Message copied by Sandford Craze on Tue Jan 07, 2011  4:47 PM ------      Message from: Eulah Pont      Created: Mon Jan 06, 2011  3:51 PM         Scheduled patient for exercise stress test  Called spoke with patient he can not go now, because of work schedule ,he will call back in a couple weeks,            ----- Message -----         From: Katrinka Blazing. Peggyann Juba, NP         Sent: 01/06/2011   8:23 AM           To: [name],            I placed an order for an exercise stress test.  Could you pls confirm that this gets scheduled with cardiology.            Thanks,            [employer]

## 2011-01-08 ENCOUNTER — Encounter: Payer: Federal, State, Local not specified - PPO | Admitting: Physician Assistant

## 2011-01-09 ENCOUNTER — Telehealth: Payer: Self-pay | Admitting: Family

## 2011-01-09 NOTE — Telephone Encounter (Signed)
Referral to Psychology  , Victorino Dike from [employer] , called pt to schedule appointment   Patient declined appointment

## 2011-02-21 ENCOUNTER — Ambulatory Visit: Payer: Federal, State, Local not specified - PPO | Admitting: Family

## 2011-04-11 ENCOUNTER — Encounter: Payer: Self-pay | Admitting: Family

## 2011-04-11 ENCOUNTER — Ambulatory Visit (INDEPENDENT_AMBULATORY_CARE_PROVIDER_SITE_OTHER): Payer: Federal, State, Local not specified - PPO | Admitting: Family

## 2011-04-11 DIAGNOSIS — H6690 Otitis media, unspecified, unspecified ear: Secondary | ICD-10-CM

## 2011-04-11 DIAGNOSIS — R509 Fever, unspecified: Secondary | ICD-10-CM

## 2011-04-11 DIAGNOSIS — H6692 Otitis media, unspecified, left ear: Secondary | ICD-10-CM

## 2011-04-11 LAB — POCT INFLUENZA A/B
Influenza A, POC: NEGATIVE
Influenza B, POC: NEGATIVE

## 2011-04-11 MED ORDER — AMOXICILLIN-POT CLAVULANATE 875-125 MG PO TABS: 1.0000 | ORAL_TABLET | Freq: Two times a day (BID) | ORAL | Status: DC

## 2011-04-11 NOTE — Progress Notes (Signed)
  Subjective:    Patient ID: Jared Myers, male    DOB: [birth year], 31 y.o.   MRN: [id number]  HPI  Mr.  Kadlec is a 31 yr old male who presents today with several symptoms.  He reports chills, sinus congestion/pressure (milky white nasal discharge), ear pressure L>R and nausea.  He reports that his symptoms started yesterday.  He has had a low grade fever.     Review of Systems See HPI  Past Medical History  Diagnosis Date  . Premature ventricular beat     History   Social History  . Marital Status: Married    Spouse Name: N/A    Number of Children: N/A  . Years of Education: N/A   Occupational History  . Not on file.   Social History Main Topics  . Smoking status: Never Smoker   . Smokeless tobacco: Not on file  . Alcohol Use: No  . Drug Use: No  . Sexually Active: Not on file   Other Topics Concern  . Not on file   Social History Narrative  . No narrative on file    No past surgical history on file.  No family history on file.  Allergies  Allergen Reactions  . Guaifenesin     REACTION: PVCs  . Prednisone     PVC    Current Outpatient Prescriptions on File Prior to Visit  Medication Sig Dispense Refill  . MAGNESIUM PO Take 2 tablets by mouth daily.          BP 130/84  Pulse 112  Temp(Src) 99.6 F (37.6 C) (Oral)  Resp 16  SpO2 99%       Objective:   Physical Exam  Constitutional: He appears well-developed and well-nourished. No distress.  HENT:  Head: Normocephalic and atraumatic.  Right Ear: Tympanic membrane and ear canal normal.  Left Ear: Tympanic membrane is erythematous. Tympanic membrane is not bulging.  Mouth/Throat: Posterior oropharyngeal erythema present. No oropharyngeal exudate.  Cardiovascular: Normal rate and regular rhythm.   No murmur heard. Pulmonary/Chest: Breath sounds normal. No respiratory distress. He has no wheezes. He has no rales. He exhibits no tenderness.          Assessment & Plan:

## 2011-04-11 NOTE — Assessment & Plan Note (Signed)
Will plan to treat with augmentin.  He is instructed to call us if symptoms worsen or if he is not feeling better in 2-3 days. Rapid flu is negative today.

## 2011-04-11 NOTE — Patient Instructions (Signed)

## 2011-04-14 ENCOUNTER — Telehealth: Payer: Self-pay | Admitting: Family

## 2011-04-14 MED ORDER — CEFUROXIME AXETIL 500 MG PO TABS: 500.0000 mg | ORAL_TABLET | Freq: Two times a day (BID) | ORAL | Status: AC

## 2011-04-14 NOTE — Telephone Encounter (Signed)
Stop augmentin, start ceftin. Call if no improvement in diarrhea, recurrent fever >101.  I suspect stomach issues are side effect of augmentin.

## 2011-04-14 NOTE — Telephone Encounter (Signed)
Pt states he has not eaten today due to the nausea, has sharp intermittent stomach pain. Diarrhea started this morning. Sat. Temp was 101. Advised pt we would provide note for work for Saturday. Please advise.

## 2011-04-14 NOTE — Telephone Encounter (Signed)
Left detailed message on pt's cell # re: instructions below and to call tomorrow if any questions. Advised pt work note has been left at the front desk for pick up.

## 2011-04-14 NOTE — Telephone Encounter (Signed)
HE IS FEELING SOME BETTER BUT NOW HAS NAUSEA STOMACH PAINS AND DIARRHEA.  WHAT SHOULD HE DO.   HE WAS UNABLE TO GO TO WORK ON Saturday AND NEEDS A NOTE.  HE HAD A FEVER OF 101

## 2011-04-14 NOTE — Telephone Encounter (Signed)
His cell is [phone]

## 2011-04-15 ENCOUNTER — Ambulatory Visit (INDEPENDENT_AMBULATORY_CARE_PROVIDER_SITE_OTHER): Payer: Federal, State, Local not specified - PPO | Admitting: Family

## 2011-04-15 ENCOUNTER — Encounter: Payer: Self-pay | Admitting: Family

## 2011-04-15 DIAGNOSIS — H6692 Otitis media, unspecified, left ear: Secondary | ICD-10-CM

## 2011-04-15 DIAGNOSIS — H6690 Otitis media, unspecified, unspecified ear: Secondary | ICD-10-CM

## 2011-04-15 NOTE — Patient Instructions (Signed)
Stop Augmentin, start ceftin.  Call if symptoms do not continue to improve

## 2011-04-15 NOTE — Progress Notes (Signed)
  Subjective:    Patient ID: Jared Myers, male    DOB: 1980-07-23, 31 y.o.   MRN: [id number]  HPI  Jared Myers is a 31 yr old male who presents today for follow up.  Notes that he has had chills.  Sinus congestion, and anorexia.  No vomiting. He reports that he is drinking gingerale.  Had diarrhea yesterday. Reports that the cell phone that is on file is "dead" and that he did not get message re: to switch to ceftin.   Review of Systems See HPI  Past Medical History  Diagnosis Date  . Premature ventricular beat     History   Social History  . Marital Status: Married    Spouse Name: N/A    Number of Children: N/A  . Years of Education: N/A   Occupational History  . Not on file.   Social History Main Topics  . Smoking status: Never Smoker   . Smokeless tobacco: Not on file  . Alcohol Use: No  . Drug Use: No  . Sexually Active: Not on file   Other Topics Concern  . Not on file   Social History Narrative  . No narrative on file    No past surgical history on file.  No family history on file.  Allergies  Allergen Reactions  . Guaifenesin     REACTION: PVCs  . Prednisone     PVC    Current Outpatient Prescriptions on File Prior to Visit  Medication Sig Dispense Refill  . cefUROXime (CEFTIN) 500 MG tablet Take 1 tablet (500 mg total) by mouth 2 (two) times daily.  14 tablet  0    BP 122/90  Pulse 82  Temp(Src) 97.9 F (36.6 C) (Oral)  Resp 16  Wt 181 lb 1.9 oz (82.155 kg)  SpO2 99%       Objective:   Physical Exam  Constitutional: He appears well-developed and well-nourished.  HENT:  Head: Normocephalic and atraumatic.  Right Ear: Tympanic membrane and ear canal normal.  Left Ear: Tympanic membrane and ear canal normal.  Mouth/Throat: No posterior oropharyngeal edema or posterior oropharyngeal erythema.  Cardiovascular: Normal rate and regular rhythm.   No murmur heard. Pulmonary/Chest: Effort normal and breath sounds normal. No respiratory  distress.          Assessment & Plan:

## 2011-04-15 NOTE — Assessment & Plan Note (Signed)
OM is resolved.  Still having sinus drainage and mild gi upset. Recommended that he stop augmentin, start ceftin.  Call if symptoms do not continue to improve.

## 2011-06-04 ENCOUNTER — Encounter: Payer: Federal, State, Local not specified - PPO | Admitting: Family

## 2011-06-04 DIAGNOSIS — Z0289 Encounter for other administrative examinations: Secondary | ICD-10-CM

## 2011-09-12 ENCOUNTER — Ambulatory Visit (INDEPENDENT_AMBULATORY_CARE_PROVIDER_SITE_OTHER): Payer: Federal, State, Local not specified - PPO | Admitting: Family

## 2011-09-12 ENCOUNTER — Encounter: Payer: Self-pay | Admitting: Family

## 2011-09-12 VITALS — BP 116/88 | HR 73 | Temp 98.2°F | Resp 16 | Wt 175.1 lb

## 2011-09-12 DIAGNOSIS — A088 Other specified intestinal infections: Secondary | ICD-10-CM

## 2011-09-12 DIAGNOSIS — A084 Viral intestinal infection, unspecified: Secondary | ICD-10-CM

## 2011-09-12 MED ORDER — ONDANSETRON HCL 4 MG PO TABS: 4.0000 mg | ORAL_TABLET | Freq: Three times a day (TID) | ORAL | Status: AC | PRN

## 2011-09-12 NOTE — Patient Instructions (Addendum)
Viral Gastroenteritis Viral gastroenteritis is also known as stomach flu. This condition affects the stomach and intestinal tract. It can cause sudden diarrhea and vomiting. The illness typically lasts 3 to 8 days. Most people develop an immune response that eventually gets rid of the virus. While this natural response develops, the virus can make you quite ill. CAUSES  Many different viruses can cause gastroenteritis, such as rotavirus or noroviruses. You can catch one of these viruses by consuming contaminated food or water. You may also catch a virus by sharing utensils or other personal items with an infected person or by touching a contaminated surface. SYMPTOMS  The most common symptoms are diarrhea and vomiting. These problems can cause a severe loss of body fluids (dehydration) and a body salt (electrolyte) imbalance. Other symptoms may include:  Fever.   Headache.   Fatigue.   Abdominal pain.  DIAGNOSIS  Your caregiver can usually diagnose viral gastroenteritis based on your symptoms and a physical exam. A stool sample may also be taken to test for the presence of viruses or other infections. TREATMENT  This illness typically goes away on its own. Treatments are aimed at rehydration. The most serious cases of viral gastroenteritis involve vomiting so severely that you are not able to keep fluids down. In these cases, fluids must be given through an intravenous line (IV). HOME CARE INSTRUCTIONS   Drink enough fluids to keep your urine clear or pale yellow. Drink small amounts of fluids frequently and increase the amounts as tolerated.   Ask your caregiver for specific rehydration instructions.   Avoid:   Foods high in sugar.   Alcohol.   Carbonated drinks.   Tobacco.   Juice.   Caffeine drinks.   Extremely hot or cold fluids.   Fatty, greasy foods.   Too much intake of anything at one time.   Dairy products until 24 to 48 hours after diarrhea stops.   You may  consume probiotics. Probiotics are active cultures of beneficial bacteria. They may lessen the amount and number of diarrheal stools in adults. Probiotics can be found in yogurt with active cultures and in supplements.   Wash your hands well to avoid spreading the virus.   Only take over-the-counter or prescription medicines for pain, discomfort, or fever as directed by your caregiver. Do not give aspirin to children. Antidiarrheal medicines are not recommended.   Ask your caregiver if you should continue to take your regular prescribed and over-the-counter medicines.   Keep all follow-up appointments as directed by your caregiver.  SEEK IMMEDIATE MEDICAL CARE IF:   You are unable to keep fluids down.   You do not urinate at least once every 6 to 8 hours.   You develop shortness of breath.   You notice blood in your stool or vomit. This may look like coffee grounds.   You have abdominal pain that increases or is concentrated in one small area (localized).   You have persistent vomiting or diarrhea.   You have a fever.   The patient is a child younger than 3 months, and he or she has a fever.   The patient is a child older than 3 months, and he or she has a fever and persistent symptoms.   The patient is a child older than 3 months, and he or she has a fever and symptoms suddenly get worse.   The patient is a baby, and he or she has no tears when crying.  MAKE SURE YOU:     Understand these instructions.   Will watch your condition.   Will get help right away if you are not doing well or get worse.  Document Released: 02/24/2005 Document Revised: 02/13/2011 Document Reviewed: 12/11/2010 [employer] Patient Information 2012 [employer]. 

## 2011-09-12 NOTE — Progress Notes (Signed)
  Subjective:    Patient ID: Jared Myers, male    DOB: 18-Oct-1980, 31 y.o.   MRN: [id number]  HPI  Mr.  Myers is a 31 yr old male who presents today with chief complaint of nausea.  He reports that symptoms started last night.   Worsened slightly this AM. He has his typical lower abdominal/pelvic pain today.  This is not worse than normal.  Denies associated vomitting, diarrhea, or fever. Denies dysuria.      Review of Systems See HPI  Past Medical History  Diagnosis Date  . Premature ventricular beat     History   Social History  . Marital Status: Married    Spouse Name: N/A    Number of Children: N/A  . Years of Education: N/A   Occupational History  . Not on file.   Social History Main Topics  . Smoking status: Never Smoker   . Smokeless tobacco: Not on file  . Alcohol Use: No  . Drug Use: No  . Sexually Active: Not on file   Other Topics Concern  . Not on file   Social History Narrative  . No narrative on file    No past surgical history on file.  No family history on file.  Allergies  Allergen Reactions  . Guaifenesin     REACTION: PVCs  . Prednisone     PVC    No current outpatient prescriptions on file prior to visit.    BP 116/88  Pulse 73  Temp 98.2 F (36.8 C) (Oral)  Resp 16  Wt 175 lb 1.3 oz (79.416 kg)  SpO2 99%       Objective:   Physical Exam  Constitutional: He appears well-developed and well-nourished. No distress.  Cardiovascular: Normal rate and regular rhythm.   No murmur heard. Pulmonary/Chest: Effort normal and breath sounds normal. No respiratory distress. He has no wheezes. He has no rales. He exhibits no tenderness.  Abdominal: Soft. Bowel sounds are normal. He exhibits no distension and no mass. There is no rebound and no guarding.       Mild diffuse abdominal tenderness is noted.  No guarding or rigidity.          Assessment & Plan:

## 2011-09-12 NOTE — Assessment & Plan Note (Addendum)
Symptoms most consistent with viral gastroenteritis.  Abdominal pain is most consistent with pt's known hx of pelvic floor dysfunction and associated chronic abdominal pain.  Will rx prn zofran.   Pt instructed to call if symptoms worsen, or if symptoms do not continue to improve.

## 2011-09-16 ENCOUNTER — Ambulatory Visit (INDEPENDENT_AMBULATORY_CARE_PROVIDER_SITE_OTHER): Payer: Federal, State, Local not specified - PPO | Admitting: Internal Medicine

## 2011-09-16 ENCOUNTER — Encounter (HOSPITAL_COMMUNITY): Payer: Self-pay | Admitting: Emergency Medicine

## 2011-09-16 ENCOUNTER — Emergency Department (HOSPITAL_COMMUNITY): Payer: Federal, State, Local not specified - PPO

## 2011-09-16 ENCOUNTER — Emergency Department (HOSPITAL_COMMUNITY)
Admission: EM | Admit: 2011-09-16 | Discharge: 2011-09-16 | Disposition: A | Discharge status: Home / Self Care | Payer: Federal, State, Local not specified - PPO | Attending: Emergency Medicine | Admitting: Emergency Medicine

## 2011-09-16 VITALS — BP 140/98 | HR 72 | Wt 174.0 lb

## 2011-09-16 DIAGNOSIS — R209 Unspecified disturbances of skin sensation: Secondary | ICD-10-CM | POA: Insufficient documentation

## 2011-09-16 DIAGNOSIS — IMO0002 Reserved for concepts with insufficient information to code with codable children: Secondary | ICD-10-CM

## 2011-09-16 DIAGNOSIS — M542 Cervicalgia: Secondary | ICD-10-CM | POA: Insufficient documentation

## 2011-09-16 DIAGNOSIS — R002 Palpitations: Secondary | ICD-10-CM | POA: Insufficient documentation

## 2011-09-16 DIAGNOSIS — F438 Other reactions to severe stress: Secondary | ICD-10-CM

## 2011-09-16 DIAGNOSIS — F430 Acute stress reaction: Secondary | ICD-10-CM

## 2011-09-16 DIAGNOSIS — R071 Chest pain on breathing: Secondary | ICD-10-CM | POA: Insufficient documentation

## 2011-09-16 HISTORY — DX: Other specified disorders of muscle: M62.89

## 2011-09-16 LAB — CBC WITH DIFFERENTIAL/PLATELET
Basophils Absolute: 0 10*3/uL (ref 0.0–0.1)
Basophils Relative: 0 % (ref 0–1)
HCT: 42 % (ref 39.0–52.0)
Hemoglobin: 15.2 g/dL (ref 13.0–17.0)
Lymphocytes Relative: 33 % (ref 12–46)
MCHC: 36.2 g/dL — ABNORMAL HIGH (ref 30.0–36.0)
Monocytes Relative: 11 % (ref 3–12)
Neutro Abs: 2.1 10*3/uL (ref 1.7–7.7)
Neutrophils Relative %: 55 % (ref 43–77)
RDW: 12.5 % (ref 11.5–15.5)
WBC: 3.9 10*3/uL — ABNORMAL LOW (ref 4.0–10.5)

## 2011-09-16 LAB — COMPREHENSIVE METABOLIC PANEL
ALT: 15 U/L (ref 0–53)
AST: 18 U/L (ref 0–37)
Albumin: 4.3 g/dL (ref 3.5–5.2)
Alkaline Phosphatase: 59 U/L (ref 39–117)
CO2: 28 mEq/L (ref 19–32)
Chloride: 104 mEq/L (ref 96–112)
GFR calc non Af Amer: >90 mL/min (ref >90)
Potassium: 4.6 mEq/L (ref 3.5–5.1)
Total Bilirubin: 0.4 mg/dL (ref 0.3–1.2)

## 2011-09-16 LAB — TROPONIN I: Troponin I: <0.3 ng/mL (ref <0.30)

## 2011-09-16 MED ORDER — ALPRAZOLAM 0.25 MG PO TABS: 0.2500 mg | ORAL_TABLET | Freq: Two times a day (BID) | ORAL | Status: AC | PRN

## 2011-09-16 NOTE — ED Provider Notes (Signed)
History     CSN: [id number]  Arrival date & time 09/16/11  6387   First MD Initiated Contact with Patient 09/16/11 0701      Chief Complaint  Patient presents with  . Chest Pain     HPI The patient presents with chest pain.  He notes that until a few days ago when he was in his usual state of health.  Since that time he notes intermittent stabbing sensation in his left anterior chest.  Sensation is nonexertional, nonpleuritic, non-positional.  There are no clear precipitating events.  There are similarly no alleviating or exacerbating factors.  He notes associated, though not consistently occurring, left arm dysesthesia without weakness.  He denies any ongoing lightheadedness, near-syncope nausea, vomiting, dyspnea. The patient states that over this time.  He has had increasing amount of work related stress. Patient denies any cigarette or alcohol use. 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 He has a history of prior PVC diagnosis.  Past Medical History  Diagnosis Date  . Premature ventricular beat   . Pelvic floor dysfunction     No past surgical history on file.  No family history on file.  History  Substance Use Topics  . Smoking status: Never Smoker   . Smokeless tobacco: Not on file  . Alcohol Use: No      Review of Systems  Constitutional:       Per HPI, otherwise negative  HENT:       Per HPI, otherwise negative  Eyes: Negative.   Respiratory:       Per HPI, otherwise negative  Cardiovascular:       Per HPI, otherwise negative  Gastrointestinal: Negative for vomiting.  Genitourinary:       Pelvic floor dysfunction - not currently an issue  Musculoskeletal:       Per HPI, otherwise negative  Skin: Negative.   Neurological: Negative for syncope.    Allergies  Guaifenesin and Prednisone  Home Medications   Current Outpatient Rx  Name Route Sig Dispense Refill  . ONDANSETRON HCL 4 MG PO TABS  Oral Take 1 tablet (4 mg total) by mouth every 8 (eight) hours as needed for nausea. 20 tablet 0    BP 144/88  Pulse 76  Temp 98.1 F (36.7 C) (Oral)  SpO2 100%  Physical Exam  Nursing note and vitals reviewed. Constitutional: He is oriented to person, place, and time. He appears well-developed. No distress.  HENT:  Head: Normocephalic and atraumatic.  Eyes: Conjunctivae and EOM are normal.  Neck: Muscular tenderness present.  Cardiovascular: Normal rate and regular rhythm.   Pulmonary/Chest: Effort normal. No stridor. No respiratory distress.  Abdominal: He exhibits no distension.  Musculoskeletal: He exhibits no edema.  Neurological: He is alert and oriented to person, place, and time.       Full sensation and strength (and ROM) of L UE. Neg Sperling test  Skin: Skin is warm and dry.  Psychiatric: He has a normal mood and affect.    ED Course  Procedures (including critical care time)   Labs Reviewed  CBC WITH DIFFERENTIAL  COMPREHENSIVE METABOLIC PANEL  TROPONIN I   No results found.   No diagnosis found.   Date: 09/16/2011  Rate: 76  Rhythm: normal sinus rhythm  QRS Axis: normal  Intervals: normal  ST/T Wave abnormalities: normal  Conduction Disutrbances: none  Narrative Interpretation: unremarkable  Cardiac: 70 - sr, normal Pulse ox 100%ra, normal  MDM  This 34 male presents with several days of intermittent chest discomfort.  On exam the patient is in no distress.  Given the patient's denial of cardiac risk factors, his ongoing training for competitive rollerskating, there is low suspicion for ongoing cardiac ischemia.  Given the description of significant related stress, his prior history of PVCs, this seems a more likely cause of his intermittent sensation.  The patient's evaluation is notable for the absence of ischemic findings, or other notable abnormalities beyond mild hyperglycemia.  The patient was discharged with instructions to follow with  his primary care physician.  Gerhard Munch, MD 09/16/11 [phone]

## 2011-09-16 NOTE — ED Notes (Addendum)
Stabbing chest pain for several days; hx of PVC's. Intermittent. Nothing makes better or worse. Nauseated over weekend when having pain but not diaphoretic.

## 2011-09-16 NOTE — ED Notes (Signed)
Pt c/o his chest throbbing. He states he has felt the same type of pain in the past with his anxiety disorder.

## 2011-09-16 NOTE — ED Notes (Signed)
Pt to XR

## 2011-09-21 ENCOUNTER — Encounter: Payer: Self-pay | Admitting: Internal Medicine

## 2011-09-21 DIAGNOSIS — F430 Acute stress reaction: Secondary | ICD-10-CM | POA: Insufficient documentation

## 2011-09-21 NOTE — Progress Notes (Signed)
  Subjective:    Patient ID: Jared Myers, male    DOB: [birth year], 31 y.o.   MRN: [id number]  HPI Pt presents to clinic for evaluation of anxiety and stressors. 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 Developed CP and presented to ED with subsequent neg w/u for ischemia-felt to be stress related. BP elevated and states ebbs and flows according to stressors primarily. No other alleviating or exacerbating factors. Total time of visit 30 mins of which >50% of time spent in counseling.  Past Medical History  Diagnosis Date  . Premature ventricular beat   . Pelvic floor dysfunction    No past surgical history on file.  reports that he has never smoked. He does not have any smokeless tobacco history on file. He reports that he does not drink alcohol or use illicit drugs. family history is not on file. Allergies  Allergen Reactions  . Guaifenesin     REACTION: PVCs  . Prednisone     PVC      Review of Systems see hpi     Objective:   Physical Exam  Nursing note and vitals reviewed. Constitutional: He appears well-developed and well-nourished. No distress.  HENT:  Head: Normocephalic and atraumatic.  Neurological: He is alert.  Skin: Skin is warm and dry. He is not diaphoretic.  Psychiatric: He has a normal mood and affect. His behavior is normal. Thought content normal.          Assessment & Plan:

## 2011-09-21 NOTE — Assessment & Plan Note (Signed)
Continuing with PT for chronic pelvic pain. Anticipate fmla paperwork

## 2011-09-21 NOTE — Assessment & Plan Note (Signed)
Begin short term prn xanax- aware of potential for sedation, addiction and/or tolerance. Short term work note provided.

## 2011-09-24 ENCOUNTER — Telehealth: Payer: Self-pay | Admitting: *Deleted

## 2011-09-24 NOTE — Telephone Encounter (Signed)
Patient returned phone call. He states that he has a several appointments this morning and the best time to call back would be around lunch time.

## 2011-09-24 NOTE — Telephone Encounter (Signed)
Received [place] [employer] FMLA forms for patient to be completed by MD 07.16.13/SLS Forms have been completed and message has been left on pt's VM , for pt call backas to if he wants our office to mail and/or fax; or if he was wanting to p/u and handle himself/SLS

## 2011-09-24 NOTE — Telephone Encounter (Signed)
Placed in outgoing mail/SLS

## 2011-09-24 NOTE — Telephone Encounter (Signed)
Patient returned phone call. He states that he would like FMLA forms mailed.

## 2012-01-25 ENCOUNTER — Encounter (HOSPITAL_BASED_OUTPATIENT_CLINIC_OR_DEPARTMENT_OTHER): Payer: Self-pay | Admitting: *Deleted

## 2012-01-25 ENCOUNTER — Emergency Department (HOSPITAL_BASED_OUTPATIENT_CLINIC_OR_DEPARTMENT_OTHER): Payer: Federal, State, Local not specified - PPO

## 2012-01-25 ENCOUNTER — Emergency Department (HOSPITAL_BASED_OUTPATIENT_CLINIC_OR_DEPARTMENT_OTHER)
Admission: EM | Admit: 2012-01-25 | Discharge: 2012-01-25 | Disposition: A | Discharge status: Home / Self Care | Payer: Federal, State, Local not specified - PPO | Attending: Emergency Medicine | Admitting: Emergency Medicine

## 2012-01-25 DIAGNOSIS — Z8679 Personal history of other diseases of the circulatory system: Secondary | ICD-10-CM | POA: Insufficient documentation

## 2012-01-25 DIAGNOSIS — S6990XA Unspecified injury of unspecified wrist, hand and finger(s), initial encounter: Secondary | ICD-10-CM | POA: Insufficient documentation

## 2012-01-25 DIAGNOSIS — M25529 Pain in unspecified elbow: Secondary | ICD-10-CM

## 2012-01-25 DIAGNOSIS — S60229A Contusion of unspecified hand, initial encounter: Secondary | ICD-10-CM | POA: Insufficient documentation

## 2012-01-25 DIAGNOSIS — Z8739 Personal history of other diseases of the musculoskeletal system and connective tissue: Secondary | ICD-10-CM | POA: Insufficient documentation

## 2012-01-25 DIAGNOSIS — Y9351 Activity, roller skating (inline) and skateboarding: Secondary | ICD-10-CM | POA: Insufficient documentation

## 2012-01-25 DIAGNOSIS — Y929 Unspecified place or not applicable: Secondary | ICD-10-CM | POA: Insufficient documentation

## 2012-01-25 DIAGNOSIS — S6390XA Sprain of unspecified part of unspecified wrist and hand, initial encounter: Secondary | ICD-10-CM | POA: Insufficient documentation

## 2012-01-25 DIAGNOSIS — S59909A Unspecified injury of unspecified elbow, initial encounter: Secondary | ICD-10-CM | POA: Insufficient documentation

## 2012-01-25 MED ORDER — IBUPROFEN 400 MG PO TABS
600.0000 mg | ORAL_TABLET | Freq: Once | ORAL | Status: AC
  Administered 2012-01-25: 600 mg via ORAL
  Filled 2012-01-25: qty 1

## 2012-01-25 MED ORDER — IBUPROFEN 600 MG PO TABS: 600.0000 mg | ORAL_TABLET | Freq: Three times a day (TID) | ORAL | Status: DC | PRN

## 2012-01-25 NOTE — ED Notes (Signed)
Pt presents to ED today with pain to left elbow after falling while skating.  Pt has pain but no obvious deformity or injury.  Ice applied in triage

## 2012-01-25 NOTE — ED Provider Notes (Signed)
History     CSN: [id number]  Arrival date & time 01/25/12  1354   First MD Initiated Contact with Patient 01/25/12 1421      Chief Complaint  Patient presents with  . Hand Injury    (Consider location/radiation/quality/duration/timing/severity/associated sxs/prior treatment) Patient is a 31 y.o. male presenting with hand injury. The history is provided by the patient.  Hand Injury  Pertinent negatives include no fever.  pt states was rollerskating, skating backwards, skates got locked w others, tripped, fell back onto outstretched right hand. C/o constant, dull, moderate, non radiating pain to left hand and left elbow. Skin intact. No numbness/weakness. No shoulder pain. No head injury. No neck or back pain. No abd pain. Denies other pain or injury. Right hand dominant.   Past Medical History  Diagnosis Date  . Premature ventricular beat   . Pelvic floor dysfunction     History reviewed. No pertinent past surgical history.  History reviewed. No pertinent family history.  History  Substance Use Topics  . Smoking status: Never Smoker   . Smokeless tobacco: Not on file  . Alcohol Use: No      Review of Systems  Constitutional: Negative for fever.  HENT: Negative for neck pain.   Respiratory: Negative for shortness of breath.   Cardiovascular: Negative for chest pain.  Gastrointestinal: Negative for abdominal pain.  Genitourinary: Negative for flank pain.  Musculoskeletal: Negative for back pain.  Skin: Negative for wound.  Neurological: Negative for headaches.    Allergies  Guaifenesin and Prednisone  Home Medications   Current Outpatient Rx  Name  Route  Sig  Dispense  Refill  . CEFDINIR 300 MG PO CAPS   Oral   Take 300 mg by mouth 2 (two) times daily.         Marland Kitchen CETIRIZINE HCL 10 MG PO TABS   Oral   Take 10 mg by mouth daily.         . B COMPLEX-C PO TABS   Oral   Take 1 tablet by mouth daily.         . IBUPROFEN 200 MG PO TABS   Oral  Take 400 mg by mouth every 6 (six) hours as needed. For pain           BP 120/86  Pulse 78  Temp 98.5 F (36.9 C) (Oral)  Resp 18  Ht 5\' 9"  (1.753 m)  Wt 165 lb (74.844 kg)  BMI 24.37 kg/m2  SpO2 100%  Physical Exam  Nursing note and vitals reviewed. Constitutional: He is oriented to person, place, and time. He appears well-developed and well-nourished. No distress.  HENT:  Head: Atraumatic.  Neck: Neck supple. No tracheal deviation present.  Cardiovascular: Normal rate.   Pulmonary/Chest: Effort normal. No accessory muscle usage. No respiratory distress.  Musculoskeletal: Normal range of motion.       Tenderness right hand over thenar/hypothenar areas. No focal scaphoid tenderness or distal radius or ulna tenderness. Radial pulse 2+. Skin intact. Mild tenderness to elbow. No sts. Good rom. No other focal bony tenderness noted.   Neurological: He is alert and oriented to person, place, and time.  Skin: Skin is warm and dry.  Psychiatric: He has a normal mood and affect.    ED Course  Procedures (including critical care time)  Dg Elbow Complete Left  01/25/2012  *RADIOLOGY REPORT*  Clinical Data: History of injury from a fall complaining of left elbow pain.  LEFT ELBOW - COMPLETE 3+ VIEW  Comparison: No priors.  Findings: Four views of the left elbow demonstrate no acute displaced fracture, subluxation, dislocation, joint or soft tissue abnormality.  IMPRESSION: No acute radiographic abnormality of the left elbow.   Original Report Authenticated By: Trudie Reed, M.D.    Dg Hand Complete Left  01/25/2012  *RADIOLOGY REPORT*  Clinical Data: Injury to the left hand after fall.  LEFT HAND - COMPLETE 3+ VIEW  Comparison: No priors.  Findings: Three views of the left hand demonstrate no acute displaced fracture, subluxation, dislocation, joint or soft tissue abnormality.  IMPRESSION: 1.  No acute radiographic abnormality of the left hand.   Original Report Authenticated By:  Trudie Reed, M.D.    ]    MDM  Splint/velcro. Icepack. Motrin po.  Xrays.    Discussed xrays w pt.          Suzi Roots, MD 01/25/12 [phone]

## 2012-01-25 NOTE — ED Notes (Signed)
Pt states he fell while skating. C/O pain to left hand and elbow. +radial pulse. Moves fingers slightly. Feels touch. Cap refill < 3 sec

## 2012-03-06 ENCOUNTER — Encounter (HOSPITAL_BASED_OUTPATIENT_CLINIC_OR_DEPARTMENT_OTHER): Payer: Self-pay | Admitting: *Deleted

## 2012-03-06 ENCOUNTER — Emergency Department (HOSPITAL_BASED_OUTPATIENT_CLINIC_OR_DEPARTMENT_OTHER)
Admission: EM | Admit: 2012-03-06 | Discharge: 2012-03-06 | Disposition: A | Discharge status: Home / Self Care | Payer: Federal, State, Local not specified - PPO | Attending: Emergency Medicine | Admitting: Emergency Medicine

## 2012-03-06 DIAGNOSIS — Z79899 Other long term (current) drug therapy: Secondary | ICD-10-CM | POA: Insufficient documentation

## 2012-03-06 DIAGNOSIS — Z8679 Personal history of other diseases of the circulatory system: Secondary | ICD-10-CM | POA: Insufficient documentation

## 2012-03-06 DIAGNOSIS — M999 Biomechanical lesion, unspecified: Secondary | ICD-10-CM | POA: Insufficient documentation

## 2012-03-06 DIAGNOSIS — R109 Unspecified abdominal pain: Secondary | ICD-10-CM | POA: Insufficient documentation

## 2012-03-06 DIAGNOSIS — R102 Pelvic and perineal pain: Secondary | ICD-10-CM

## 2012-03-06 DIAGNOSIS — R110 Nausea: Secondary | ICD-10-CM | POA: Insufficient documentation

## 2012-03-06 LAB — URINALYSIS, ROUTINE W REFLEX MICROSCOPIC
Bilirubin Urine: NEGATIVE
Glucose, UA: NEGATIVE mg/dL
Hgb urine dipstick: NEGATIVE
Ketones, ur: 15 mg/dL — AB
Nitrite: NEGATIVE
pH: 6.5 (ref 5.0–8.0)

## 2012-03-06 MED ORDER — ALPRAZOLAM 0.25 MG PO TABS: 0.2500 mg | ORAL_TABLET | Freq: Every evening | ORAL | Status: DC | PRN

## 2012-03-06 MED ORDER — IBUPROFEN 800 MG PO TABS
800.0000 mg | ORAL_TABLET | Freq: Once | ORAL | Status: AC
  Administered 2012-03-06: 800 mg via ORAL

## 2012-03-06 MED ORDER — IBUPROFEN 800 MG PO TABS
ORAL_TABLET | ORAL | Status: AC
  Administered 2012-03-06: 800 mg via ORAL
  Filled 2012-03-06: qty 1

## 2012-03-06 MED ORDER — ONDANSETRON 8 MG PO TBDP
8.0000 mg | ORAL_TABLET | Freq: Once | ORAL | Status: AC
  Administered 2012-03-06: 8 mg via ORAL
  Filled 2012-03-06: qty 1

## 2012-03-06 NOTE — ED Provider Notes (Signed)
History     CSN: [id number]  Arrival date & time 03/06/12  1257   First MD Initiated Contact with Patient 03/06/12 1344      Chief Complaint  Patient presents with  . Pelvic Pain    (Consider location/radiation/quality/duration/timing/severity/associated sxs/prior treatment) Patient is a 31 y.o. male presenting with pelvic pain. The history is provided by the patient. No language interpreter was used.  Pelvic Pain This is a recurrent problem. The current episode started today. The problem has been gradually worsening. Associated symptoms include nausea. Pertinent negatives include no fever, numbness, swollen glands, urinary symptoms or weakness. The symptoms are aggravated by walking. He has tried nothing for the symptoms.   32 year old with pelvic floor dysfunction coming in with pelvic pain. States that the pain is typical of his  pelvic floor dysfunction pain. Patient goes to Dr. Irving Burton who prescribed him Xanax which helped the pain in the past.  States that he had a stressful situation at work and had to leave and that's when the pain began. States that the pain is exacerbated by stress. Patient states that he is to [facility] in [place] for therapy but has not been able to go lately. Past medical history of PVCs.   Past Medical History  Diagnosis Date  . Premature ventricular beat   . Pelvic floor dysfunction     History reviewed. No pertinent past surgical history.  History reviewed. No pertinent family history.  History  Substance Use Topics  . Smoking status: Never Smoker   . Smokeless tobacco: Not on file  . Alcohol Use: No      Review of Systems  Constitutional: Negative.  Negative for fever.  HENT: Negative.   Eyes: Negative.   Respiratory: Negative.   Cardiovascular: Negative.   Gastrointestinal: Negative.  Positive for nausea.  Genitourinary: Positive for pelvic pain.  Neurological: Negative.  Negative for weakness and numbness.    Psychiatric/Behavioral: Negative.   All other systems reviewed and are negative.    Allergies  Guaifenesin and Prednisone  Home Medications   Current Outpatient Rx  Name  Route  Sig  Dispense  Refill  . B COMPLEX-C PO TABS   Oral   Take 1 tablet by mouth daily.         Marland Kitchen CEFDINIR 300 MG PO CAPS   Oral   Take 300 mg by mouth 2 (two) times daily.         Marland Kitchen CETIRIZINE HCL 10 MG PO TABS   Oral   Take 10 mg by mouth daily.         . IBUPROFEN 200 MG PO TABS   Oral   Take 400 mg by mouth every 6 (six) hours as needed. For pain         . IBUPROFEN 600 MG PO TABS   Oral   Take 1 tablet (600 mg total) by mouth every 8 (eight) hours as needed for pain. Take with food.   20 tablet   0     BP 130/95  Pulse 113  Temp 98.2 F (36.8 C) (Oral)  Resp 16  Ht 5\' 7"  (1.702 m)  Wt 161 lb (73.029 kg)  BMI 25.22 kg/m2  SpO2 100%  Physical Exam  Nursing note and vitals reviewed. Constitutional: He is oriented to person, place, and time. He appears well-developed and well-nourished.  HENT:  Head: Normocephalic.  Eyes: Conjunctivae normal and EOM are normal. Pupils are equal, round, and reactive to light.  Neck: Normal  range of motion. Neck supple.  Cardiovascular: Normal rate.   Pulmonary/Chest: Effort normal.  Abdominal: Soft.  Genitourinary: Penis normal. Right testis shows no swelling and no tenderness. Left testis shows no swelling and no tenderness. Circumcised. No penile erythema or penile tenderness. No discharge found.  Musculoskeletal: Normal range of motion.  Neurological: He is alert and oriented to person, place, and time.  Skin: Skin is warm and dry.  Psychiatric: He has a normal mood and affect.    ED Course  Procedures (including critical care time)  Labs Reviewed  URINALYSIS, ROUTINE W REFLEX MICROSCOPIC - Abnormal; Notable for the following:    Ketones, ur 15 (*)     All other components within normal limits   No results found.   No  diagnosis found.    MDM  Pelvic pain dysfunction exacerbated by stress at work today. Xanax prescription for the pain has worked in the pain passed. Patient will followup with Dr. Lovell Sheehan on Monday who has seen him in the past for this condition.  Discussed the side effects of the xanax and he will not drive with this medication.  Work note provided. Labs Reviewed  URINALYSIS, ROUTINE W REFLEX MICROSCOPIC - Abnormal; Notable for the following:    Ketones, ur 15 (*)     All other components within normal limits          Remi Haggard, NP 03/07/12 1053

## 2012-03-06 NOTE — ED Notes (Signed)
Pt states he has a hx of "pelvic floor dysfunction" and this episode came on suddenly about an hour ago. "Brought on by stress"

## 2012-03-06 NOTE — ED Notes (Signed)
Patient reports a one year history of "pelvic floor dysfunction" that causes him to have a sensation of tightening muscles in his groin and painful intermittent sensations. Patient has normal bowel and bladder function, last BM today. Reports the pain starts due to having "problems with my boss" "and it makes it so I can't work." "I go to physical therapy ... They teach me how to relax and breath." Patient had previous success with anti-anxiety medications. Request prescription pain medication at this time. Patient appears relaxed and comfortable, brisk regular stride, a&O, in no distress. Reports pain 8/10 in lower abdomen and inguinal areas.

## 2012-03-08 ENCOUNTER — Telehealth: Payer: Self-pay | Admitting: Internal Medicine

## 2012-03-08 NOTE — Telephone Encounter (Signed)
Patient states that he was seen in the ED last Saturday regarding pelvic problems?. He wants an appointment today because his employer needs a work note. Also, patient states that the medicine that the physician has him on is making him stay awake at night and would like something to help him sleep.

## 2012-03-08 NOTE — Telephone Encounter (Signed)
Please call pt and let him know that we do not have availability today; he can be seen by Dr. Rodena Medin tomorrow OR he can be seen at another [facility] office today [he would need to get work note from the ED]. Thanks.

## 2012-03-08 NOTE — Telephone Encounter (Signed)
Left message for patient to return my call.

## 2012-03-08 NOTE — Telephone Encounter (Signed)
Patient returned phone call and scheduled an appointment for tomorrow morning.

## 2012-03-09 ENCOUNTER — Encounter: Payer: Self-pay | Admitting: Internal Medicine

## 2012-03-09 ENCOUNTER — Ambulatory Visit (INDEPENDENT_AMBULATORY_CARE_PROVIDER_SITE_OTHER): Payer: Federal, State, Local not specified - PPO | Admitting: Internal Medicine

## 2012-03-09 VITALS — BP 124/84 | HR 88 | Temp 98.0°F | Resp 16 | Wt 166.8 lb

## 2012-03-09 DIAGNOSIS — IMO0002 Reserved for concepts with insufficient information to code with codable children: Secondary | ICD-10-CM

## 2012-03-09 MED ORDER — TRAMADOL HCL 50 MG PO TABS: 50.0000 mg | ORAL_TABLET | Freq: Three times a day (TID) | ORAL | Status: DC | PRN

## 2012-03-13 NOTE — Assessment & Plan Note (Signed)
Recurrence of chronic pelvic pain. Attempt ultram prn -no sz hx. Resume PT. Work note provided.

## 2012-03-13 NOTE — Progress Notes (Signed)
  Subjective:    Patient ID: Jared Myers, male    DOB: 12-17-1980, 32 y.o.   MRN: [id number]  HPI Pt presented to clinic for follow up of chronic pelvic pain. Pain had previously improved to the point was able to stop PT. Unfortunately pain recently returned and increased and was seen in ED. Given xanax prn for associated anxiety. Last PT was 2 months ago and plans on resuming next week. No alleviating or exacerbating factors. Total time of visit ~22 minutes of which >50% of time spent in counseling.  Past Medical History  Diagnosis Date  . Premature ventricular beat   . Pelvic floor dysfunction    No past surgical history on file.  reports that he has never smoked. He does not have any smokeless tobacco history on file. He reports that he does not drink alcohol or use illicit drugs. family history is not on file. Allergies  Allergen Reactions  . Guaifenesin     REACTION: PVCs  . Prednisone     PVC     Review of Systems see hpi     Objective:   Physical Exam  Nursing note and vitals reviewed. Constitutional: He appears well-developed and well-nourished. No distress.  HENT:  Head: Normocephalic and atraumatic.  Neurological: He is alert.  Skin: He is not diaphoretic.          Assessment & Plan:

## 2012-03-14 NOTE — ED Provider Notes (Signed)
Medical screening examination/treatment/procedure(s) were performed by non-physician practitioner and as supervising physician I was immediately available for consultation/collaboration.   Charles B. Sheldon, MD 03/14/12 1024 

## 2012-04-16 ENCOUNTER — Encounter: Payer: Self-pay | Admitting: Internal Medicine

## 2012-04-16 ENCOUNTER — Ambulatory Visit (INDEPENDENT_AMBULATORY_CARE_PROVIDER_SITE_OTHER): Payer: Federal, State, Local not specified - PPO | Admitting: Internal Medicine

## 2012-04-16 ENCOUNTER — Telehealth: Payer: Self-pay

## 2012-04-16 VITALS — BP 138/90 | HR 71 | Temp 98.3°F | Wt 167.0 lb

## 2012-04-16 DIAGNOSIS — Z635 Disruption of family by separation and divorce: Secondary | ICD-10-CM

## 2012-04-16 DIAGNOSIS — M25559 Pain in unspecified hip: Secondary | ICD-10-CM

## 2012-04-16 DIAGNOSIS — M6289 Other specified disorders of muscle: Secondary | ICD-10-CM

## 2012-04-16 DIAGNOSIS — Z569 Unspecified problems related to employment: Secondary | ICD-10-CM

## 2012-04-16 DIAGNOSIS — Z566 Other physical and mental strain related to work: Secondary | ICD-10-CM

## 2012-04-16 NOTE — Telephone Encounter (Signed)
We received paperwork for FMLA, unfortunately pt will need to be seen to get this paperwork filled out.   Appt scheduled

## 2012-04-16 NOTE — Telephone Encounter (Signed)
FYI:  Patient states he went in to see Dr Alwyn Ren today and has never had to walk out of an appt but had to walk out today. Pt states he had a good relationship with Dr Milinda Cave and if Dr Abner Greenspan is anything like Dr Alwyn Ren he was not going to come to [place] anymore. Pt states he has never been so insulted and put down the way he was today in his appt with Dr Alwyn Ren. I informed pt that I was sorry he had a bad experience with a doctor at [facility] but to please give Dr Abner Greenspan a chance. 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 Pt then proceeded to tell me thank you for listening and he will be in on the 19th to meet Dr Abner Greenspan and give her a chance. I apologized again for his experience today.

## 2012-04-16 NOTE — Progress Notes (Signed)
  Subjective:    Patient ID: Jared Myers, male    DOB: 11/14/1980, 32 y.o.   MRN: [id number]  HPI PELVIC PAIN: Pelvic floor dysfunction  pain began 2 years ago ; exacerbation this am in the L psoas area  in the context of work stress interacting with supervisors @ the [employer] & impending divorce. The pain is described as dull- throbbing, pulsating and radiating to left pelvis & scrotum.  Pain has been treated with Physical Therapy intermittently for 1& 1/2 years with response until flare today. No associated limb  numbness , tingling or weakness has been present  Todate he has not worked with a Camera operator. 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                                                                                    Review of Systems Constitutional: No fever, chills, sweats. 10 #  weight loss since 12/13 from stress affecting appetite. HEENT: No diplopia, blurred vision, or loss of vision.  Cardiopulmonary: No chest pain; palpitations; dyspnea GI: No  Melena or rectal bleeding. Some constipation GU: No hematuria, pyuria, or dysuria MS: No joint stiffness;redness, swelling Neuro; no urine/ bowel incontinence Heme/Lymph:No abnormal bruising or bleeding     Objective:   Physical Exam General appearance is one of good health and nourishment w/o distress.  Eyes: No conjunctival inflammation or scleral icterus is present.  Oral exam: Dental hygiene is good; lips and gums are healthy appearing.There is no oropharyngeal erythema or exudate noted.   Heart:  Normal rate and regular rhythm. S1 and S2 normal without gallop, murmur, click, rub or other extra sounds. S4     Lungs:Chest clear to auscultation; no wheezes, rhonchi,rales ,or rubs present.No increased work of breathing.   Abdomen: bowel sounds normal, soft and non-tender without masses, organomegaly or hernias noted.  No  guarding or rebound   Skin:Warm & dry.  Intact without suspicious lesions or rashes ; no jaundice or tenting  Lymphatic: No lymphadenopathy is noted about the head, neck, axilla, or inguinal areas.    Genital exam unremarkable  Deep tendon reflexes normal and equal. Strength and tone in lower extremities normal. Negative straight leg raising bilaterally             Assessment & Plan:  #1 pelvic floor dysfunction; this is in the context of exogenous stresses at work and in his marriage. He is not feel that stress management needs to be a component of the process.  I offered him referral to the [facility] to see if they have a program that might be of benefit to him.  He became very upset with the discussion of involving a mental health professional in managing the triggering stressors . At that time he left without the urologic referral being made

## 2012-04-17 ENCOUNTER — Encounter: Payer: Self-pay | Admitting: Internal Medicine

## 2012-04-28 ENCOUNTER — Ambulatory Visit (INDEPENDENT_AMBULATORY_CARE_PROVIDER_SITE_OTHER): Payer: Federal, State, Local not specified - PPO | Admitting: Family Medicine

## 2012-04-28 ENCOUNTER — Encounter: Payer: Self-pay | Admitting: Family Medicine

## 2012-04-28 ENCOUNTER — Telehealth: Payer: Self-pay

## 2012-04-28 VITALS — BP 117/78 | HR 75 | Temp 98.8°F | Ht 67.5 in | Wt 168.1 lb

## 2012-04-28 DIAGNOSIS — F430 Acute stress reaction: Secondary | ICD-10-CM

## 2012-04-28 DIAGNOSIS — R030 Elevated blood-pressure reading, without diagnosis of hypertension: Secondary | ICD-10-CM

## 2012-04-28 DIAGNOSIS — J069 Acute upper respiratory infection, unspecified: Secondary | ICD-10-CM | POA: Insufficient documentation

## 2012-04-28 MED ORDER — METHOCARBAMOL 500 MG PO TABS: 500.0000 mg | ORAL_TABLET | Freq: Two times a day (BID) | ORAL | Status: DC | PRN

## 2012-04-28 NOTE — Progress Notes (Signed)
Patient ID: Jared Myers, male   DOB: [birth year], 32 y.o.   MRN: [id number] Jared Myers [id number] 1980-06-24 04/28/2012      Progress Note-Follow Up  Subjective  Chief Complaint  Chief Complaint  Patient presents with  . FMLA paperwork    HPI  Patient is a 32 year old Caucasian male who is in today to discuss his pelvic floor dysfunction. Continues to have intermittent trouble with lower abdominal and low back pain. Spasm down into the groin frequently. It is worse with stress. No dysuria or hematuria. No discharge or fevers. No malaise or myalgias. He acknowledges she's under great deal of stress both at work and at home. He is undergoing a divorce. Acknowledges that he needs more physical therapy for his pelvic floor dysfunction in his stress is higher. Has more trouble constipation whenever this up as well. Struggles congestion as well and malaise cough  Past Medical History  Diagnosis Date  . Premature ventricular beat   . Pelvic floor dysfunction   . Acute upper respiratory infections of unspecified site 04/28/2012    History reviewed. No pertinent past surgical history.  History reviewed. No pertinent family history.  History   Social History  . Marital Status: Married    Spouse Name: N/A    Number of Children: N/A  . Years of Education: N/A   Occupational History  . Not on file.   Social History Main Topics  . Smoking status: Never Smoker   . Smokeless tobacco: Not on file  . Alcohol Use: No  . Drug Use: No  . Sexually Active: No   Other Topics Concern  . Not on file   Social History Narrative  . No narrative on file    No current outpatient prescriptions on file prior to visit.   No current facility-administered medications on file prior to visit.    Allergies  Allergen Reactions  . Guaifenesin     REACTION: PVCs  . Prednisone     PVC    Review of Systems  Review of Systems  Constitutional: Negative for fever and malaise/fatigue.   HENT: Positive for congestion.   Eyes: Negative for discharge.  Respiratory: Positive for sputum production. Negative for shortness of breath.   Cardiovascular: Negative for chest pain, palpitations and leg swelling.  Gastrointestinal: Positive for abdominal pain. Negative for nausea and diarrhea.  Genitourinary: Negative for dysuria.  Musculoskeletal: Positive for back pain. Negative for falls.  Skin: Negative for rash.  Neurological: Negative for loss of consciousness and headaches.  Endo/Heme/Allergies: Negative for polydipsia.  Psychiatric/Behavioral: Negative for depression and suicidal ideas. The patient is not nervous/anxious and does not have insomnia.     Objective  BP 117/78  Pulse 75  Temp(Src) 98.8 F (37.1 C) (Oral)  Ht 5' 7.5" (1.715 m)  Wt 168 lb 1.9 oz (76.259 kg)  BMI 25.93 kg/m2  SpO2 97%  Physical Exam  Physical Exam  Constitutional: He is oriented to person, place, and time and well-developed, well-nourished, and in no distress. No distress.  HENT:  Head: Normocephalic and atraumatic.  Eyes: Conjunctivae are normal.  Neck: Neck supple. No thyromegaly present.  Cardiovascular: Normal rate, regular rhythm and normal heart sounds.   No murmur heard. Pulmonary/Chest: Effort normal and breath sounds normal. No respiratory distress.  Abdominal: He exhibits no distension and no mass. There is no tenderness.  Musculoskeletal: He exhibits no edema.  Neurological: He is alert and oriented to person, place, and time.  Skin: Skin is warm.  Psychiatric: Memory, affect and judgment normal.    No results found for this basename: TSH   Lab Results  Component Value Date   WBC 3.9* 09/16/2011   HGB 15.2 09/16/2011   HCT 42.0 09/16/2011   MCV 85.5 09/16/2011   PLT 214 09/16/2011   Lab Results  Component Value Date   CREATININE 0.97 09/16/2011   BUN 11 09/16/2011   NA 140 09/16/2011   K 4.6 09/16/2011   CL 104 09/16/2011   CO2 28 09/16/2011   Lab Results  Component Value  Date   ALT 15 09/16/2011   AST 18 09/16/2011   ALKPHOS 59 09/16/2011   BILITOT 0.4 09/16/2011     Assessment & Plan  Pelvic floor dysfunction Discussed at length the causes. He is given an rx for Robaxin to use prn and he is asked to consider an rx for an SSRI such as Escitalopram for chronic pain and anxiety. He will call if he decides to proceed  Acute upper respiratory infections of unspecified site Mild, improving, encouraged Mucinex, Zicam and probiotics, call if worsens  ELEVATED BLOOD PRESSURE WITHOUT DIAGNOSIS OF HYPERTENSION Adequately controlled   GENITOURINARY DISORDER Pelvic floor dysfunction is folowing closely with physical therapist in [place]. Has missed work on numerous occasions, including 1/15, 2/3, 2/7, 2/8. Given Robaxin to try prn   Stress reaction Is struggling with ongoing divorce and difficulties with work but feels he is getting through it ok declines medications at this time.

## 2012-04-28 NOTE — Assessment & Plan Note (Signed)
Discussed at length the causes. He is given an rx for Robaxin to use prn and he is asked to consider an rx for an SSRI such as Escitalopram for chronic pain and anxiety. He will call if he decides to proceed

## 2012-04-28 NOTE — Telephone Encounter (Signed)
FYI: Patient left a message stating he was supposed to call back with some dates that he missed work: 1-15, 2-3, 2-7 and 04-17-12. He also stated the paperwork was supposed to be turned in on the 15th of Feb but they have gave him an extension to 05-04-12.

## 2012-04-28 NOTE — Assessment & Plan Note (Signed)
Mild, improving, encouraged Mucinex, Zicam and probiotics, call if worsens

## 2012-05-02 NOTE — Assessment & Plan Note (Signed)
Is struggling with ongoing divorce and difficulties with work but feels he is getting through it ok declines medications at this time.

## 2012-05-02 NOTE — Assessment & Plan Note (Addendum)
Pelvic floor dysfunction is folowing closely with physical therapist in [place]. Has missed work on numerous occasions, including 1/15, 2/3, 2/7, 2/8. Given Robaxin to try prn

## 2012-05-02 NOTE — Assessment & Plan Note (Signed)
Adequately controlled 

## 2012-05-12 ENCOUNTER — Ambulatory Visit (INDEPENDENT_AMBULATORY_CARE_PROVIDER_SITE_OTHER): Payer: Self-pay | Admitting: Family Medicine

## 2012-05-12 ENCOUNTER — Encounter: Payer: Self-pay | Admitting: Family Medicine

## 2012-05-12 VITALS — BP 125/85 | HR 76 | Temp 98.8°F | Ht 67.5 in | Wt 163.1 lb

## 2012-05-12 DIAGNOSIS — F411 Generalized anxiety disorder: Secondary | ICD-10-CM

## 2012-05-12 DIAGNOSIS — F430 Acute stress reaction: Secondary | ICD-10-CM

## 2012-05-12 DIAGNOSIS — K4090 Unilateral inguinal hernia, without obstruction or gangrene, not specified as recurrent: Secondary | ICD-10-CM

## 2012-05-12 DIAGNOSIS — R4589 Other symptoms and signs involving emotional state: Secondary | ICD-10-CM

## 2012-05-12 DIAGNOSIS — M62838 Other muscle spasm: Secondary | ICD-10-CM

## 2012-05-12 DIAGNOSIS — R030 Elevated blood-pressure reading, without diagnosis of hypertension: Secondary | ICD-10-CM

## 2012-05-12 HISTORY — DX: Unilateral inguinal hernia, without obstruction or gangrene, not specified as recurrent: K40.90

## 2012-05-12 MED ORDER — METHOCARBAMOL 500 MG PO TABS: 500.0000 mg | ORAL_TABLET | Freq: Four times a day (QID) | ORAL | Status: DC

## 2012-05-12 MED ORDER — ESCITALOPRAM OXALATE 5 MG PO TABS: 5.0000 mg | ORAL_TABLET | Freq: Every day | ORAL | Status: DC

## 2012-05-12 NOTE — Assessment & Plan Note (Signed)
Improved at today's visit 

## 2012-05-12 NOTE — Assessment & Plan Note (Signed)
Robaxin has been somewhat helpful and has not caused sedation so we will increase this to qid. Will continue his PT frequently for now due to his increased stress

## 2012-05-12 NOTE — Patient Instructions (Addendum)

## 2012-05-12 NOTE — Assessment & Plan Note (Signed)
Agrees to initiation of Escitalopram 5 mg daily and reassess at next visit

## 2012-05-12 NOTE — Assessment & Plan Note (Signed)
Symptomatic, is referred to general surgery for further consideration

## 2012-05-13 NOTE — Progress Notes (Signed)
Patient ID: Jared Myers, male   DOB: Jun 08, 1980, 32 y.o.   MRN: [id number] ALEXY HELDT [id number] [birth year] 05/13/2012      Progress Note-Follow Up  Subjective  Chief Complaint  Chief Complaint  Patient presents with  . Follow-up    on medication  . discuss possible workers comp    HPI  32-year-old Caucasian male who is in today with increasing abdominal pain and GU pain. Has a long history of pelvic floor dysfunction and anxiety and recently his job is going worse. He is also noted to have a small internal herniawhich is likely symptomatic. Having pain in his left lower quadrantI with pain radiating down to his left and sometimes right testicle. Lately she's also been having stabbing pains all the way to the tip at glans penis. He acknowledges quite a bit of this is related to increased work stress. At this point he is in the process of filing a disability case with work secondary to his symptoms. He presently agrees to start a low dose SSRI as previously discussed. As tolerated Robaxin and does get some relief with it without sedation. Has been doing physical therapy for his pelvic floor dysfunction) helpful but had hoped to decrease the frequency now is unable to do so. Also notes with increased stress his palpitations have increased again without chest pain  Past Medical History  Diagnosis Date  . Premature ventricular beat   . Pelvic floor dysfunction   . Acute upper respiratory infections of unspecified site 04/28/2012  . 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 Inguinal hernia 05/12/2012    History reviewed. No pertinent past surgical history.  History reviewed. No pertinent family history.  History   Social History  . Marital Status: Divorced    Spouse Name: N/A    Number of Children: N/A  . Years of Education: N/A   Occupational History  . Not on file.   Social History Main Topics  . Smoking status: Never Smoker   . Smokeless tobacco: Not on file  .  Alcohol Use: No  . Drug Use: No  . Sexually Active: No   Other Topics Concern  . Not on file   Social History Narrative  . No narrative on file    Current Outpatient Prescriptions on File Prior to Visit  Medication Sig Dispense Refill  . pseudoephedrine-acetaminophen (TYLENOL SINUS) 30-500 MG TABS Take 1 tablet by mouth every 4 (four) hours as needed.       No current facility-administered medications on file prior to visit.    Allergies  Allergen Reactions  . Guaifenesin     REACTION: PVCs  . Prednisone     PVC    Review of Systems  Review of Systems  Constitutional: Negative for fever and malaise/fatigue.  HENT: Negative for congestion.   Eyes: Negative for discharge.  Respiratory: Negative for shortness of breath.   Cardiovascular: Negative for chest pain, palpitations and leg swelling.  Gastrointestinal: Positive for abdominal pain. Negative for nausea and diarrhea.  Genitourinary: Negative for dysuria.  Musculoskeletal: Negative for falls.  Skin: Negative for rash.  Neurological: Negative for loss of consciousness and headaches.  Endo/Heme/Allergies: Negative for polydipsia.  Psychiatric/Behavioral: Negative for depression and suicidal ideas. The patient is not nervous/anxious and does not have insomnia.     Objective  BP 125/85  Pulse 76  Temp(Src) 98.8 F (37.1 C) (Temporal)  Ht 5' 7.5" (1.715 m)  Wt 163 lb 1.9 oz (73.991 kg)  BMI 25.16  kg/m2  SpO2 99%  Physical Exam  Physical Exam  Constitutional: He is oriented to person, place, and time and well-developed, well-nourished, and in no distress. No distress.  HENT:  Head: Normocephalic and atraumatic.  Eyes: Conjunctivae are normal.  Neck: Neck supple. No thyromegaly present.  Cardiovascular: Normal rate, regular rhythm and normal heart sounds.   No murmur heard. Pulmonary/Chest: Effort normal and breath sounds normal. No respiratory distress.  Abdominal: He exhibits no distension and no mass.  There is no tenderness.  Musculoskeletal: He exhibits no edema.  Neurological: He is alert and oriented to person, place, and time.  Skin: Skin is warm.  Psychiatric: Memory, affect and judgment normal.    No results found for this basename: TSH   Lab Results  Component Value Date   WBC 3.9* 09/16/2011   HGB 15.2 09/16/2011   HCT 42.0 09/16/2011   MCV 85.5 09/16/2011   PLT 214 09/16/2011   Lab Results  Component Value Date   CREATININE 0.97 09/16/2011   BUN 11 09/16/2011   NA 140 09/16/2011   K 4.6 09/16/2011   CL 104 09/16/2011   CO2 28 09/16/2011   Lab Results  Component Value Date   ALT 15 09/16/2011   AST 18 09/16/2011   ALKPHOS 59 09/16/2011   BILITOT 0.4 09/16/2011     Assessment & Plan  Anxiety in acute stress reaction Agrees to initiation of Escitalopram 5 mg daily and reassess at next visit  ELEVATED BLOOD PRESSURE WITHOUT DIAGNOSIS OF HYPERTENSION Improved at today's visit.  Pelvic floor dysfunction Robaxin has been somewhat helpful and has not caused sedation so we will increase this to qid. Will continue his PT frequently for now due to his increased stress  Inguinal hernia Symptomatic, is referred to general surgery for further consideration

## 2012-05-17 ENCOUNTER — Ambulatory Visit (INDEPENDENT_AMBULATORY_CARE_PROVIDER_SITE_OTHER): Payer: Self-pay | Admitting: Surgery

## 2012-05-19 ENCOUNTER — Ambulatory Visit: Payer: Federal, State, Local not specified - PPO | Admitting: Family Medicine

## 2012-05-20 ENCOUNTER — Telehealth: Payer: Self-pay | Admitting: Family Medicine

## 2012-05-20 NOTE — Telephone Encounter (Signed)
Please advise 

## 2012-05-20 NOTE — Telephone Encounter (Signed)
Can we get him set up with a surgeon in [facility]?) for symptomatic inguinal hernia? Patient is symptomatic

## 2012-05-21 ENCOUNTER — Ambulatory Visit (INDEPENDENT_AMBULATORY_CARE_PROVIDER_SITE_OTHER): Payer: Federal, State, Local not specified - PPO | Admitting: Family Medicine

## 2012-05-21 ENCOUNTER — Ambulatory Visit: Payer: Federal, State, Local not specified - PPO | Admitting: Family Medicine

## 2012-05-21 ENCOUNTER — Encounter: Payer: Self-pay | Admitting: Family Medicine

## 2012-05-21 VITALS — BP 126/80 | HR 68 | Temp 97.2°F | Ht 67.5 in | Wt 166.8 lb

## 2012-05-21 DIAGNOSIS — M6289 Other specified disorders of muscle: Secondary | ICD-10-CM

## 2012-05-21 MED ORDER — ESCITALOPRAM OXALATE 10 MG PO TABS: 10.0000 mg | ORAL_TABLET | Freq: Every day | ORAL | Status: DC

## 2012-05-21 MED ORDER — TRAMADOL HCL 50 MG PO TABS: 50.0000 mg | ORAL_TABLET | Freq: Three times a day (TID) | ORAL | Status: DC | PRN

## 2012-05-21 NOTE — Assessment & Plan Note (Signed)
Has increased his therapy but does feel his pain is worsening. Robaxin and Tramadol have been helpful in past will give refill on Tramadol today

## 2012-05-21 NOTE — Assessment & Plan Note (Signed)
Numbers look good today

## 2012-05-21 NOTE — Patient Instructions (Addendum)

## 2012-05-21 NOTE — Progress Notes (Signed)
Patient ID: Jared Myers, male   DOB: 1980/10/24, 32 y.o.   MRN: [id number] Jared Myers [id number] [birth year] 05/21/2012      Progress Note-Follow Up  Subjective  Chief Complaint  Chief Complaint  Patient presents with  . Follow-up    1 week- doesn't feel like Lexapro is" doing a whole lot"    HPI  Patient is a 32 year old Caucasian male who is in today for followup. He continues to struggle with a great deal of stress and pain. He is in physical therapy routinely for his pelvic floor dysfunction abdominal pain. We are trying to arrange consultation with a surgeon to evaluate his inguinal hernia as well. His bowels are moving well and has not had any urinary complaints at this time. No fevers or chills. No anorexia or nausea. Does still have frequent loose stool and abdominal discomfort. Has not had any trouble to Lexapro but has not felt that made a difference thus far. Is presently considering returning to work and limited light duty as he feels he can handle this no other acute complaints. No chest pain, palpitations, shortness of breath, headache  Past Medical History  Diagnosis Date  . Premature ventricular beat   . Pelvic floor dysfunction   . Acute upper respiratory infections of unspecified site 04/28/2012  . 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 Inguinal hernia 05/12/2012    History reviewed. No pertinent past surgical history.  History reviewed. No pertinent family history.  History   Social History  . Marital Status: Divorced    Spouse Name: N/A    Number of Children: N/A  . Years of Education: N/A   Occupational History  . Not on file.   Social History Main Topics  . Smoking status: Never Smoker   . Smokeless tobacco: Not on file  . Alcohol Use: No  . Drug Use: No  . Sexually Active: No   Other Topics Concern  . Not on file   Social History Narrative  . No narrative on file    Current Outpatient Prescriptions on File Prior to Visit   Medication Sig Dispense Refill  . methocarbamol (ROBAXIN) 500 MG tablet Take 1 tablet (500 mg total) by mouth 4 (four) times daily.  90 tablet  1  . pseudoephedrine-acetaminophen (TYLENOL SINUS) 30-500 MG TABS Take 1 tablet by mouth every 4 (four) hours as needed.       No current facility-administered medications on file prior to visit.    Allergies  Allergen Reactions  . Guaifenesin     REACTION: PVCs  . Prednisone     PVC    Review of Systems  Review of Systems  Constitutional: Negative for fever and malaise/fatigue.  HENT: Negative for congestion.   Eyes: Negative for discharge.  Respiratory: Negative for shortness of breath.   Cardiovascular: Negative for chest pain, palpitations and leg swelling.  Gastrointestinal: Positive for abdominal pain. Negative for nausea and diarrhea.  Genitourinary: Negative for dysuria.  Musculoskeletal: Negative for falls.  Skin: Negative for rash.  Neurological: Negative for loss of consciousness and headaches.  Endo/Heme/Allergies: Negative for polydipsia.  Psychiatric/Behavioral: Negative for depression and suicidal ideas. The patient is nervous/anxious. The patient does not have insomnia.     Objective  BP 126/80  Pulse 68  Temp(Src) 97.2 F (36.2 C) (Temporal)  Ht 5' 7.5" (1.715 m)  Wt 166 lb 12.8 oz (75.66 kg)  BMI 25.72 kg/m2  SpO2 100%  Physical Exam  Physical Exam  Constitutional: He  is oriented to person, place, and time and well-developed, well-nourished, and in no distress. No distress.  HENT:  Head: Normocephalic and atraumatic.  Eyes: Conjunctivae are normal.  Neck: Neck supple. No thyromegaly present.  Cardiovascular: Normal rate, regular rhythm and normal heart sounds.   No murmur heard. Pulmonary/Chest: Effort normal and breath sounds normal. No respiratory distress.  Abdominal: He exhibits no distension and no mass. There is no tenderness.  Musculoskeletal: He exhibits no edema.  Neurological: He is alert  and oriented to person, place, and time.  Skin: Skin is warm.  Psychiatric: Memory, affect and judgment normal.    No results found for this basename: TSH   Lab Results  Component Value Date   WBC 3.9* 09/16/2011   HGB 15.2 09/16/2011   HCT 42.0 09/16/2011   MCV 85.5 09/16/2011   PLT 214 09/16/2011   Lab Results  Component Value Date   CREATININE 0.97 09/16/2011   BUN 11 09/16/2011   NA 140 09/16/2011   K 4.6 09/16/2011   CL 104 09/16/2011   CO2 28 09/16/2011   Lab Results  Component Value Date   ALT 15 09/16/2011   AST 18 09/16/2011   ALKPHOS 59 09/16/2011   BILITOT 0.4 09/16/2011     Assessment & Plan  ELEVATED BLOOD PRESSURE WITHOUT DIAGNOSIS OF HYPERTENSION Numbers look good today  Anxiety in acute stress reaction Patient has tolerated the initial dose of Escitalopram but has not noted any benefit yet. Agrees to increase strength to 10 mg daily today. 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 He is released to light duty next week as tolerated.   Inguinal hernia Is in need of a surgeon who takes his [facility] insurance in the [place], [place] area if possible. Will investigate.   Pelvic floor dysfunction Has increased his therapy but does feel his pain is worsening. Robaxin and Tramadol have been helpful in past will give refill on Tramadol today

## 2012-05-21 NOTE — Assessment & Plan Note (Signed)
Is in need of a surgeon who takes his [facility] insurance in the [place], [place] area if possible. Will investigate.

## 2012-05-21 NOTE — Assessment & Plan Note (Signed)
Patient has tolerated the initial dose of Escitalopram but has not noted any benefit yet. Agrees to increase strength to 10 mg daily today. 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 He is released to light duty next week as tolerated.

## 2012-05-24 ENCOUNTER — Telehealth: Payer: Self-pay | Admitting: Family Medicine

## 2012-05-24 NOTE — Telephone Encounter (Signed)
Nikki please advise

## 2012-05-24 NOTE — Telephone Encounter (Signed)
Please advise 

## 2012-05-24 NOTE — Telephone Encounter (Signed)
Patient is requesting a referral to [facility] [phone].

## 2012-05-24 NOTE — Telephone Encounter (Signed)
So he already has a referral to general surgery for the hernia and this can be used any where he wants to go. I do not know who has managed his referral thus far, just get it forwarded to whomever that is and he can go see the [facility]

## 2012-05-27 ENCOUNTER — Encounter: Payer: Self-pay | Admitting: Family Medicine

## 2012-05-27 ENCOUNTER — Ambulatory Visit (INDEPENDENT_AMBULATORY_CARE_PROVIDER_SITE_OTHER): Payer: Federal, State, Local not specified - PPO | Admitting: Family Medicine

## 2012-05-27 VITALS — BP 122/82 | HR 80 | Temp 98.0°F | Ht 67.5 in | Wt 163.1 lb

## 2012-05-27 DIAGNOSIS — R030 Elevated blood-pressure reading, without diagnosis of hypertension: Secondary | ICD-10-CM

## 2012-05-27 DIAGNOSIS — J069 Acute upper respiratory infection, unspecified: Secondary | ICD-10-CM

## 2012-05-27 DIAGNOSIS — M6289 Other specified disorders of muscle: Secondary | ICD-10-CM

## 2012-05-27 DIAGNOSIS — R109 Unspecified abdominal pain: Secondary | ICD-10-CM

## 2012-05-27 DIAGNOSIS — F430 Acute stress reaction: Secondary | ICD-10-CM

## 2012-05-27 MED ORDER — HYDROCODONE-ACETAMINOPHEN 5-325 MG PO TABS: 1.0000 | ORAL_TABLET | Freq: Four times a day (QID) | ORAL | Status: DC | PRN

## 2012-05-27 MED ORDER — HYDROCODONE-ACETAMINOPHEN 10-325 MG PO TABS: 1.0000 | ORAL_TABLET | Freq: Three times a day (TID) | ORAL | Status: DC | PRN

## 2012-05-27 MED ORDER — GUAIFENESIN ER 600 MG PO TB12: 600.0000 mg | ORAL_TABLET | Freq: Two times a day (BID) | ORAL | Status: DC

## 2012-05-27 MED ORDER — MELOXICAM 15 MG PO TABS: 15.0000 mg | ORAL_TABLET | Freq: Every day | ORAL | Status: DC | PRN

## 2012-05-27 NOTE — Patient Instructions (Addendum)
Mucinex twice daily. Probiotics daily such as Digestive Advantage, Afrin at bed for 3-5 days as needed, Nyquil is fine, nasal saline is good . Xicam nasal spray or Coldeeze tabs   Upper Respiratory Infection, Adult An upper respiratory infection (URI) is also known as the common cold. It is often caused by a type of germ (virus). Colds are easily spread (contagious). You can pass it to others by kissing, coughing, sneezing, or drinking out of the same glass. Usually, you get better in 1 or 2 weeks.  HOME CARE   Only take medicine as told by your doctor.  Use a warm mist humidifier or breathe in steam from a hot shower.  Drink enough water and fluids to keep your pee (urine) clear or pale yellow.  Get plenty of rest.  Return to work when your temperature is back to normal or as told by your doctor. You may use a face mask and wash your hands to stop your cold from spreading. GET HELP RIGHT AWAY IF:   After the first few days, you feel you are getting worse.  You have questions about your medicine.  You have chills, shortness of breath, or brown or red spit (mucus).  You have yellow or brown snot (nasal discharge) or pain in the face, especially when you bend forward.  You have a fever, puffy (swollen) neck, pain when you swallow, or white spots in the back of your throat.  You have a bad headache, ear pain, sinus pain, or chest pain.  You have a high-pitched whistling sound when you breathe in and out (wheezing).  You have a lasting cough or cough up blood.  You have sore muscles or a stiff neck. MAKE SURE YOU:   Understand these instructions.  Will watch your condition.  Will get help right away if you are not doing well or get worse. Document Released: 08/13/2007 Document Revised: 05/19/2011 Document Reviewed: 07/01/2010 [facility] Patient Information 2013 [place], [place].

## 2012-05-30 ENCOUNTER — Encounter: Payer: Self-pay | Admitting: Family Medicine

## 2012-05-30 NOTE — Progress Notes (Signed)
Patient ID: Jared Myers, male   DOB: 13-Jul-1980, 32 y.o.   MRN: [id number] Jared Myers [id number] [birth year] 05/30/2012      Progress Note-Follow Up  Subjective  Chief Complaint  Chief Complaint  Patient presents with  . forms for work    Workers comp    HPI  Patient is a 32 year old Caucasian male who is in today for followup. He continues to struggle with her abdominal and pelvic floor dysfunction and pain. He is continuing with physical therapy which he finds helpful. The saphenofemoral helps just makes him feel fuzzy head. Is complaining of head congestion but no cough or sore throat. No ear pain or headache. No fevers or chills. No chest pain no palpitations, shortness of breath, GI concerns. Does feel that he is having improvement in his anxiety with Lexapro. He initially went back to work but notes his pain is worse at night and with heavy lifting so we will have him return for day work only mild lifting.  Past Medical History  Diagnosis Date  . Premature ventricular beat   . Pelvic floor dysfunction   . Acute upper respiratory infections of unspecified site 04/28/2012  . 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 Inguinal hernia 05/12/2012    History reviewed. No pertinent past surgical history.  No family history on file.  History   Social History  . Marital Status: Divorced    Spouse Name: N/A    Number of Children: N/A  . Years of Education: N/A   Occupational History  . Not on file.   Social History Main Topics  . Smoking status: Never Smoker   . Smokeless tobacco: Not on file  . Alcohol Use: No  . Drug Use: No  . Sexually Active: No   Other Topics Concern  . Not on file   Social History Narrative  . No narrative on file    Current Outpatient Prescriptions on File Prior to Visit  Medication Sig Dispense Refill  . escitalopram (LEXAPRO) 10 MG tablet Take 1 tablet (10 mg total) by mouth daily.  30 tablet  3  . methocarbamol (ROBAXIN) 500 MG  tablet Take 1 tablet (500 mg total) by mouth 4 (four) times daily.  90 tablet  1  . pseudoephedrine-acetaminophen (TYLENOL SINUS) 30-500 MG TABS Take 1 tablet by mouth every 4 (four) hours as needed.       No current facility-administered medications on file prior to visit.    Allergies  Allergen Reactions  . Guaifenesin     REACTION: PVCs  . Prednisone     PVC    Review of Systems  Review of Systems  Constitutional: Negative for fever and malaise/fatigue.  HENT: Negative for congestion.   Eyes: Negative for discharge.  Respiratory: Negative for shortness of breath.   Cardiovascular: Negative for chest pain, palpitations and leg swelling.  Gastrointestinal: Positive for abdominal pain. Negative for nausea and diarrhea.  Genitourinary: Negative for dysuria.  Musculoskeletal: Negative for falls.  Skin: Negative for rash.  Neurological: Negative for loss of consciousness and headaches.  Endo/Heme/Allergies: Negative for polydipsia.  Psychiatric/Behavioral: Negative for suicidal ideas. The patient is nervous/anxious. The patient does not have insomnia.     Objective  BP 122/82  Pulse 80  Temp(Src) 98 F (36.7 C) (Oral)  Ht 5' 7.5" (1.715 m)  Wt 163 lb 1.9 oz (73.991 kg)  BMI 25.16 kg/m2  SpO2 98%  Physical Exam  Physical Exam  Constitutional: He is oriented to person,  place, and time and well-developed, well-nourished, and in no distress. No distress.  HENT:  Head: Normocephalic and atraumatic.  Eyes: Conjunctivae are normal.  Neck: Neck supple. No thyromegaly present.  Cardiovascular: Normal rate, regular rhythm and normal heart sounds.   No murmur heard. Pulmonary/Chest: Effort normal and breath sounds normal. No respiratory distress.  Abdominal: He exhibits no distension and no mass. There is no tenderness.  Musculoskeletal: He exhibits no edema.  Neurological: He is alert and oriented to person, place, and time.  Skin: Skin is warm.  Psychiatric: Memory,  affect and judgment normal.    No results found for this basename: TSH   Lab Results  Component Value Date   WBC 3.9* 09/16/2011   HGB 15.2 09/16/2011   HCT 42.0 09/16/2011   MCV 85.5 09/16/2011   PLT 214 09/16/2011   Lab Results  Component Value Date   CREATININE 0.97 09/16/2011   BUN 11 09/16/2011   NA 140 09/16/2011   K 4.6 09/16/2011   CL 104 09/16/2011   CO2 28 09/16/2011  os Lab Results  Component Value Date   ALT 15 09/16/2011   AST 18 09/16/2011   ALKPHOS 59 09/16/2011   BILITOT 0.4 09/16/2011     Assessment & Plan  ELEVATED BLOOD PRESSURE WITHOUT DIAGNOSIS OF HYPERTENSION Well controlled at visit.  Pelvic floor dysfunction Pain is persistent, tramadol not helpful and leaves him feeling foggy. Will change meds and continue PT.  Stress reaction He feels he is having a good response to Lexapro.

## 2012-05-30 NOTE — Assessment & Plan Note (Signed)
He feels he is having a good response to Lexapro.

## 2012-05-30 NOTE — Assessment & Plan Note (Signed)
Well controlled at visit.

## 2012-05-30 NOTE — Assessment & Plan Note (Signed)
Pain is persistent, tramadol not helpful and leaves him feeling foggy. Will change meds and continue PT.

## 2012-06-01 ENCOUNTER — Ambulatory Visit (INDEPENDENT_AMBULATORY_CARE_PROVIDER_SITE_OTHER): Payer: Federal, State, Local not specified - PPO | Admitting: Family Medicine

## 2012-06-01 ENCOUNTER — Encounter: Payer: Self-pay | Admitting: Family Medicine

## 2012-06-01 VITALS — BP 122/90 | HR 81 | Temp 98.1°F | Ht 67.5 in | Wt 165.0 lb

## 2012-06-01 DIAGNOSIS — K4090 Unilateral inguinal hernia, without obstruction or gangrene, not specified as recurrent: Secondary | ICD-10-CM

## 2012-06-01 DIAGNOSIS — F430 Acute stress reaction: Secondary | ICD-10-CM

## 2012-06-01 DIAGNOSIS — M6289 Other specified disorders of muscle: Secondary | ICD-10-CM

## 2012-06-01 DIAGNOSIS — R300 Dysuria: Secondary | ICD-10-CM

## 2012-06-01 DIAGNOSIS — R030 Elevated blood-pressure reading, without diagnosis of hypertension: Secondary | ICD-10-CM

## 2012-06-01 NOTE — Patient Instructions (Addendum)
Hernia  A hernia happens when an organ inside your body pushes out through a weak spot in your belly (abdominal) wall. Most hernias get worse over time. They can often be pushed back into place (reduced). Surgery may be needed to repair hernias that cannot be pushed into place.  HOME CARE  · Keep doing normal activities.  · Avoid lifting more than 10 pounds (4.5 kilograms).  · Cough gently and avoid straining. Over time, these things will:  · Increase your hernia size.  · Irritate your hernia.  · Break down hernia repairs.  · Stop smoking.  · Do not wear anything tight over your hernia. Do not keep the hernia in with an outside bandage.  · Eat food that is high in fiber (fruit, vegetables, whole grains).  · Drink enough fluids to keep your pee (urine) clear or pale yellow.  · Take medicines to make your poop soft (stool softeners) if you cannot poop (constipated).  GET HELP RIGHT AWAY IF:   · You have a fever.  · You have belly pain that gets worse.  · You feel sick to your stomach (nauseous) and throw up (vomit).  · Your skin starts to bulge out.  · Your hernia turns a different color, feels hard, or is tender.  · You have increased pain or puffiness (swelling) around the hernia.  · You poop more or less often.  · Your poop does not look the way normally does.  · You have watery poop (diarrhea).  · You cannot push the hernia back in place by applying gentle pressure while lying down.  MAKE SURE YOU:   · Understand these instructions.  · Will watch your condition.  · Will get help right away if you are not doing well or get worse.  Document Released: 08/14/2009 Document Revised: 05/19/2011 Document Reviewed: 08/14/2009  [employer]® Patient Information ©2013 [employer].

## 2012-06-02 ENCOUNTER — Telehealth: Payer: Self-pay | Admitting: Family Medicine

## 2012-06-02 LAB — URINALYSIS
Bilirubin Urine: NEGATIVE
Glucose, UA: NEGATIVE mg/dL
Ketones, ur: NEGATIVE mg/dL
Specific Gravity, Urine: 1.014 (ref 1.005–1.030)
pH: 7 (ref 5.0–8.0)

## 2012-06-02 NOTE — Telephone Encounter (Signed)
Patient called in with fax #[phone]. Please send cover sheet attn: Sal Ricigliaso. If that doesn't work please mail the form.

## 2012-06-03 NOTE — Telephone Encounter (Signed)
faxed

## 2012-06-06 NOTE — Assessment & Plan Note (Signed)
Continues to struggle with pain, is made worst by physical and emotional stress. Robaxin has been somewhat helpful.

## 2012-06-06 NOTE — Assessment & Plan Note (Signed)
Well controlled today.

## 2012-06-06 NOTE — Progress Notes (Signed)
Patient ID: Jared Myers, male   DOB: [birth year], 32 y.o.   MRN: [id number] Jared Myers [id number] 1980/03/23 06/06/2012      Progress Note-Follow Up  Subjective  Chief Complaint  Chief Complaint  Patient presents with  . Follow-up    disability and FMLA paperwork    HPI  Patient is a 32 year old Caucasian male who is in today for followup on abdominal pain and pelvic floor dysfunction. 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 Work stress at his abdominal and pelvic floor discomfort worse. He does believe the Lexapro is helping him deal of distress somewhat but his only been recently started. He is hoping to return to work soon and is going to have to have limited duty. Is unable to work at night due to worsening pain at night. Also his not going to lift anything over 20-30 pounds routinely. No chest pain or palpitations today. No shortness of breath. Her ongoing abdominal pain persists  Past Medical History  Diagnosis Date  . Premature ventricular beat   . Pelvic floor dysfunction   . Acute upper respiratory infections of unspecified site 04/28/2012  . 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 Inguinal hernia 05/12/2012    History reviewed. No pertinent past surgical history.  History reviewed. No pertinent family history.  History   Social History  . Marital Status: Divorced    Spouse Name: N/A    Number of Children: N/A  . Years of Education: N/A   Occupational History  . Not on file.   Social History Main Topics  . Smoking status: Never Smoker   . Smokeless tobacco: Not on file  . Alcohol Use: No  . Drug Use: No  . Sexually Active: No   Other Topics Concern  . Not on file   Social History Narrative  . No narrative on file    Current Outpatient Prescriptions on File Prior to Visit  Medication Sig Dispense Refill  . escitalopram (LEXAPRO) 10 MG tablet Take 1 tablet (10 mg total) by mouth daily.  30 tablet  3  .  guaiFENesin (MUCINEX) 600 MG 12 hr tablet Take 1 tablet (600 mg total) by mouth 2 (two) times daily.  20 tablet  0  . HYDROcodone-acetaminophen (NORCO) 5-325 MG per tablet Take 1 tablet by mouth every 6 (six) hours as needed for pain.  30 tablet  0  . meloxicam (MOBIC) 15 MG tablet Take 1 tablet (15 mg total) by mouth daily as needed for pain (with food).  30 tablet  1  . methocarbamol (ROBAXIN) 500 MG tablet Take 1 tablet (500 mg total) by mouth 4 (four) times daily.  90 tablet  1  . pseudoephedrine-acetaminophen (TYLENOL SINUS) 30-500 MG TABS Take 1 tablet by mouth every 4 (four) hours as needed.       No current facility-administered medications on file prior to visit.    Allergies  Allergen Reactions  . Guaifenesin     REACTION: PVCs  . Prednisone     PVC    Review of Systems  Review of Systems  Constitutional: Negative for fever and malaise/fatigue.  HENT: Negative for congestion.   Eyes: Negative for discharge.  Respiratory: Negative for shortness of breath.   Cardiovascular: Positive for palpitations. Negative for chest pain and leg swelling.  Gastrointestinal: Positive for abdominal pain. Negative for nausea and diarrhea.  Genitourinary: Negative for dysuria.  Musculoskeletal: Negative for falls.  Skin: Negative for  rash.  Neurological: Negative for loss of consciousness and headaches.  Endo/Heme/Allergies: Negative for polydipsia.  Psychiatric/Behavioral: Positive for depression. Negative for suicidal ideas. The patient is nervous/anxious. The patient does not have insomnia.     Objective  BP 122/90  Pulse 81  Temp(Src) 98.1 F (36.7 C) (Oral)  Ht 5' 7.5" (1.715 m)  Wt 165 lb 0.6 oz (74.862 kg)  BMI 25.45 kg/m2  SpO2 97%  Physical Exam  Physical Exam  Constitutional: He is oriented to person, place, and time and well-developed, well-nourished, and in no distress. No distress.  HENT:  Head: Normocephalic and atraumatic.  Eyes: Conjunctivae are normal.   Neck: Neck supple. No thyromegaly present.  Cardiovascular: Normal rate, regular rhythm and normal heart sounds.   No murmur heard. Pulmonary/Chest: Effort normal and breath sounds normal. No respiratory distress.  Abdominal: He exhibits no distension and no mass. There is no tenderness.  Musculoskeletal: He exhibits no edema.  Neurological: He is alert and oriented to person, place, and time.  Skin: Skin is warm.  Psychiatric: Memory, affect and judgment normal.    No results found for this basename: TSH   Lab Results  Component Value Date   WBC 3.9* 09/16/2011   HGB 15.2 09/16/2011   HCT 42.0 09/16/2011   MCV 85.5 09/16/2011   PLT 214 09/16/2011   Lab Results  Component Value Date   CREATININE 0.97 09/16/2011   BUN 11 09/16/2011   NA 140 09/16/2011   K 4.6 09/16/2011   CL 104 09/16/2011   CO2 28 09/16/2011   Lab Results  Component Value Date   ALT 15 09/16/2011   AST 18 09/16/2011   ALKPHOS 59 09/16/2011   BILITOT 0.4 09/16/2011     Assessment & Plan  Pelvic floor dysfunction Continues to struggle with pain, is made worst by physical and emotional stress. Robaxin has been somewhat helpful.  Stress reaction Is tolerating lexapro with some marginal improvement, continue same for now.  Inguinal hernia Is going to proceed with surgical correction due to increasing pain  ELEVATED BLOOD PRESSURE WITHOUT DIAGNOSIS OF HYPERTENSION Well controlled today

## 2012-06-06 NOTE — Assessment & Plan Note (Signed)
Is going to proceed with surgical correction due to increasing pain

## 2012-06-06 NOTE — Assessment & Plan Note (Signed)
Is tolerating lexapro with some marginal improvement, continue same for now.

## 2012-06-08 ENCOUNTER — Ambulatory Visit: Payer: Federal, State, Local not specified - PPO | Admitting: Family Medicine

## 2012-06-11 ENCOUNTER — Ambulatory Visit: Payer: Federal, State, Local not specified - PPO | Admitting: Family Medicine

## 2012-06-14 ENCOUNTER — Telehealth: Payer: Self-pay | Admitting: Family Medicine

## 2012-06-14 NOTE — Telephone Encounter (Signed)
If you were not given the address for workers comp forms to be mailed

## 2012-06-14 NOTE — Telephone Encounter (Signed)
Address for workers comp claim to be mailed  [employer] [place] [address] [place] [address]  PLEASE CALL THE PATIENT TO ADVISE THESE HAVE BEEN MAILED.

## 2012-06-15 NOTE — Telephone Encounter (Signed)
Jared Myers mailed and notified pt

## 2012-06-29 ENCOUNTER — Telehealth: Payer: Self-pay

## 2012-06-29 NOTE — Telephone Encounter (Signed)
Patient left a message stating that he has more paperwork to be filled out but would like to talk to the nurse to explain what he needs in the paperwork before he drops it off.   I left a message for patient to return my call

## 2012-06-29 NOTE — Telephone Encounter (Signed)
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 Pt doesn't know if it needs to say something about the hernia? Patient is going to drop off paperwork to the [facility] office tomorrow afternoon and will go over with MD anything else during his appt on May 1 14.  Pt needs a note for work. Pt is supposed to go back on 07-02-12. Pt needs a note to get him through until May 1, 14. When he comes in for appt.   [facility] won't do surgery until the workers comp starts. So therefore pt hasn't done surgery yet.   Pt stated that when he went to the Emergency Room and they didn't do a CT, they did some other type of test and didn't see the hernia?

## 2012-06-29 NOTE — Telephone Encounter (Signed)
Ok to give him work note to keep him out until May 1 appt

## 2012-06-30 NOTE — Telephone Encounter (Signed)
Pt states he will get this when he comes to his appt next week

## 2012-07-05 ENCOUNTER — Ambulatory Visit: Payer: Federal, State, Local not specified - PPO | Admitting: Family Medicine

## 2012-07-07 NOTE — Telephone Encounter (Signed)
Please see recent documentation pertaining to this referral in patient's chart.

## 2012-07-07 NOTE — Telephone Encounter (Signed)
Encounter closed, patient will address in 07/08/12 OV.

## 2012-07-08 ENCOUNTER — Encounter: Payer: Self-pay | Admitting: Family Medicine

## 2012-07-08 ENCOUNTER — Ambulatory Visit (INDEPENDENT_AMBULATORY_CARE_PROVIDER_SITE_OTHER): Payer: Federal, State, Local not specified - PPO | Admitting: Family Medicine

## 2012-07-08 VITALS — BP 124/82 | HR 95 | Temp 98.2°F | Ht 67.5 in | Wt 162.1 lb

## 2012-07-08 DIAGNOSIS — R109 Unspecified abdominal pain: Secondary | ICD-10-CM

## 2012-07-08 DIAGNOSIS — F430 Acute stress reaction: Secondary | ICD-10-CM

## 2012-07-08 DIAGNOSIS — M6289 Other specified disorders of muscle: Secondary | ICD-10-CM

## 2012-07-08 DIAGNOSIS — R52 Pain, unspecified: Secondary | ICD-10-CM

## 2012-07-08 DIAGNOSIS — R030 Elevated blood-pressure reading, without diagnosis of hypertension: Secondary | ICD-10-CM

## 2012-07-08 DIAGNOSIS — R300 Dysuria: Secondary | ICD-10-CM

## 2012-07-08 DIAGNOSIS — K4090 Unilateral inguinal hernia, without obstruction or gangrene, not specified as recurrent: Secondary | ICD-10-CM

## 2012-07-08 MED ORDER — HYOSCYAMINE SULFATE 0.125 MG SL SUBL: 0.1250 mg | SUBLINGUAL_TABLET | SUBLINGUAL | Status: DC | PRN

## 2012-07-08 MED ORDER — ESCITALOPRAM OXALATE 20 MG PO TABS: 20.0000 mg | ORAL_TABLET | Freq: Every day | ORAL | Status: DC

## 2012-07-09 ENCOUNTER — Ambulatory Visit (HOSPITAL_BASED_OUTPATIENT_CLINIC_OR_DEPARTMENT_OTHER)
Admission: RE | Admit: 2012-07-09 | Discharge: 2012-07-09 | Disposition: A | Discharge status: Home / Self Care | Payer: Federal, State, Local not specified - PPO | Source: Ambulatory Visit | Attending: Family Medicine | Admitting: Family Medicine

## 2012-07-09 ENCOUNTER — Encounter (HOSPITAL_BASED_OUTPATIENT_CLINIC_OR_DEPARTMENT_OTHER): Payer: Self-pay

## 2012-07-09 DIAGNOSIS — K4090 Unilateral inguinal hernia, without obstruction or gangrene, not specified as recurrent: Secondary | ICD-10-CM

## 2012-07-09 DIAGNOSIS — R110 Nausea: Secondary | ICD-10-CM | POA: Insufficient documentation

## 2012-07-09 DIAGNOSIS — Q619 Cystic kidney disease, unspecified: Secondary | ICD-10-CM | POA: Insufficient documentation

## 2012-07-09 DIAGNOSIS — R109 Unspecified abdominal pain: Secondary | ICD-10-CM

## 2012-07-09 DIAGNOSIS — R1032 Left lower quadrant pain: Secondary | ICD-10-CM | POA: Insufficient documentation

## 2012-07-09 DIAGNOSIS — K311 Adult hypertrophic pyloric stenosis: Secondary | ICD-10-CM | POA: Insufficient documentation

## 2012-07-09 MED ORDER — IOHEXOL 300 MG/ML  SOLN
100.0000 mL | Freq: Once | INTRAMUSCULAR | Status: AC | PRN
  Administered 2012-07-09: 100 mL via INTRAVENOUS

## 2012-07-11 NOTE — Progress Notes (Signed)
Patient ID: Jared Myers, male   DOB: 1980/06/11, 32 y.o.   MRN: [id number] DELIA SITAR [id number] 09-24-1980 07/11/2012      Progress Note-Follow Up  Subjective  Chief Complaint  Chief Complaint  Patient presents with  . Follow-up    2 week    HPI  Patient is 32 year old Caucasian male in today for followup. He continues to have lower abdominal and pelvic/groin pain. It is itermittent in its intensity but persistent. No fevers/chilss/CP/palp/SOB/GI c/o. No dysuria. Continues to struggle with stress secondary to his work situation and chronic pain. Will be returning to work this next week for financial reasons but is concerned about his ongoing pain  Past Medical History  Diagnosis Date  . Premature ventricular beat   . Pelvic floor dysfunction   . Acute upper respiratory infections of unspecified site 04/28/2012  . 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 Inguinal hernia 05/12/2012    History reviewed. No pertinent past surgical history.  History reviewed. No pertinent family history.  History   Social History  . Marital Status: Divorced    Spouse Name: N/A    Number of Children: N/A  . Years of Education: N/A   Occupational History  . Not on file.   Social History Main Topics  . Smoking status: Never Smoker   . Smokeless tobacco: Not on file  . Alcohol Use: No  . Drug Use: No  . Sexually Active: No   Other Topics Concern  . Not on file   Social History Narrative  . No narrative on file    Current Outpatient Prescriptions on File Prior to Visit  Medication Sig Dispense Refill  . HYDROcodone-acetaminophen (NORCO) 5-325 MG per tablet Take 1 tablet by mouth every 6 (six) hours as needed for pain.  30 tablet  0   No current facility-administered medications on file prior to visit.    Allergies  Allergen Reactions  . Guaifenesin     REACTION: PVCs  . Prednisone     PVC    Review of Systems  Review of Systems  Constitutional: Negative for  fever and malaise/fatigue.  HENT: Negative for congestion.   Eyes: Negative for discharge.  Respiratory: Negative for shortness of breath.   Cardiovascular: Negative for chest pain, palpitations and leg swelling.  Gastrointestinal: Positive for abdominal pain. Negative for nausea and diarrhea.  Genitourinary: Positive for frequency. Negative for dysuria.  Musculoskeletal: Negative for falls.  Skin: Negative for rash.  Neurological: Negative for loss of consciousness and headaches.  Endo/Heme/Allergies: Negative for polydipsia.  Psychiatric/Behavioral: Negative for depression and suicidal ideas. The patient is not nervous/anxious and does not have insomnia.     Objective  BP 124/82  Pulse 95  Temp(Src) 98.2 F (36.8 C) (Oral)  Ht 5' 7.5" (1.715 m)  Wt 162 lb 1.3 oz (73.519 kg)  BMI 25 kg/m2  SpO2 97%  Physical Exam  Physical Exam  Constitutional: He is oriented to person, place, and time and well-developed, well-nourished, and in no distress. No distress.  HENT:  Head: Normocephalic and atraumatic.  Eyes: Conjunctivae are normal.  Neck: Neck supple. No thyromegaly present.  Cardiovascular: Normal rate, regular rhythm and normal heart sounds.   No murmur heard. Pulmonary/Chest: Effort normal and breath sounds normal. No respiratory distress.  Abdominal: He exhibits no distension and no mass. There is no tenderness.  Musculoskeletal: He exhibits no edema.  Neurological: He is alert and oriented to person, place, and time.  Skin: Skin  is warm.  Psychiatric: Memory, affect and judgment normal.    No results found for this basename: TSH   Lab Results  Component Value Date   WBC 3.9* 09/16/2011   HGB 15.2 09/16/2011   HCT 42.0 09/16/2011   MCV 85.5 09/16/2011   PLT 214 09/16/2011   Lab Results  Component Value Date   CREATININE 0.97 09/16/2011   BUN 11 09/16/2011   NA 140 09/16/2011   K 4.6 09/16/2011   CL 104 09/16/2011   CO2 28 09/16/2011   Lab Results  Component Value Date   ALT  15 09/16/2011   AST 18 09/16/2011   ALKPHOS 59 09/16/2011   BILITOT 0.4 09/16/2011     Assessment & Plan  ELEVATED BLOOD PRESSURE WITHOUT DIAGNOSIS OF HYPERTENSION Adequately controlled today  Pelvic floor dysfunction Persistent pain in groin and lower abdomen L>R, continue PT. Needs to continue restrictions,no lifting over 20 #, extended or night shifts secondary to increased pain   Stress reaction Partial response to Lexapro 10 mg will increase to 20 mg and reassess at next visit  Inguinal hernia Not seen on repeat CT scan but prominent prostate noted. Will proceed with PSA and refer to urology for further consideration

## 2012-07-11 NOTE — Assessment & Plan Note (Signed)
Persistent pain in groin and lower abdomen L>R, continue PT. Needs to continue restrictions,no lifting over 20 #, extended or night shifts secondary to increased pain

## 2012-07-11 NOTE — Assessment & Plan Note (Signed)
Adequately controlled today 

## 2012-07-11 NOTE — Assessment & Plan Note (Signed)
Partial response to Lexapro 10 mg will increase to 20 mg and reassess at next visit

## 2012-07-11 NOTE — Assessment & Plan Note (Signed)
Not seen on repeat CT scan but prominent prostate noted. Will proceed with PSA and refer to urology for further consideration

## 2012-08-09 ENCOUNTER — Encounter: Payer: Self-pay | Admitting: Family Medicine

## 2012-08-09 ENCOUNTER — Ambulatory Visit (INDEPENDENT_AMBULATORY_CARE_PROVIDER_SITE_OTHER): Payer: Federal, State, Local not specified - PPO | Admitting: Family Medicine

## 2012-08-09 VITALS — BP 118/82 | HR 80 | Temp 97.6°F | Ht 67.5 in | Wt 163.1 lb

## 2012-08-09 DIAGNOSIS — M6289 Other specified disorders of muscle: Secondary | ICD-10-CM

## 2012-08-09 DIAGNOSIS — R109 Unspecified abdominal pain: Secondary | ICD-10-CM

## 2012-08-09 DIAGNOSIS — F430 Acute stress reaction: Secondary | ICD-10-CM

## 2012-08-09 DIAGNOSIS — R030 Elevated blood-pressure reading, without diagnosis of hypertension: Secondary | ICD-10-CM

## 2012-08-09 MED ORDER — HYDROCODONE-ACETAMINOPHEN 5-325 MG PO TABS: 1.0000 | ORAL_TABLET | Freq: Four times a day (QID) | ORAL | Status: DC | PRN

## 2012-08-09 NOTE — Progress Notes (Signed)
Patient ID: Jared Myers, male   DOB: [birth year], 32 y.o.   MRN: [id number] Jared Myers [id number] 1980-03-23 08/09/2012      Progress Note-Follow Up  Subjective  Chief Complaint  Chief Complaint  Patient presents with  . Follow-up    HPI  Patient is a 32 year old Caucasian male in today for followup. He continues to be [employer] from [place] [employer]. He has been prepared to return to work but they have not given him a location at which to report. Because he's not been working and lifting heavily his pelvic floor dysfunction and pain are somewhat improved although it does still continue to recur frequently. Bowels are moving relatively well at this time. Stress continues to be an issue but also is improved somewhat with decreased contact with work. No chest pain or palpitations. No shortness of breath or GI complaints.  Past Medical History  Diagnosis Date  . Premature ventricular beat   . Pelvic floor dysfunction   . Acute upper respiratory infections of unspecified site 04/28/2012  . 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 Inguinal hernia 05/12/2012    History reviewed. No pertinent past surgical history.  History reviewed. No pertinent family history.  History   Social History  . Marital Status: Divorced    Spouse Name: N/A    Number of Children: N/A  . Years of Education: N/A   Occupational History  . Not on file.   Social History Main Topics  . Smoking status: Never Smoker   . Smokeless tobacco: Not on file  . Alcohol Use: No  . Drug Use: No  . Sexually Active: No   Other Topics Concern  . Not on file   Social History Narrative  . No narrative on file    Current Outpatient Prescriptions on File Prior to Visit  Medication Sig Dispense Refill  . escitalopram (LEXAPRO) 20 MG tablet Take 1 tablet (20 mg total) by mouth daily.  30 tablet  3  . hyoscyamine (LEVSIN SL) 0.125 MG SL tablet Place 1 tablet (0.125 mg total) under the tongue every 4 (four) hours  as needed for cramping (abdominal pain).  30 tablet  0   No current facility-administered medications on file prior to visit.    Allergies  Allergen Reactions  . Guaifenesin     REACTION: PVCs  . Prednisone     PVC    Review of Systems  Review of Systems  Constitutional: Negative for fever and malaise/fatigue.  HENT: Negative for congestion.   Eyes: Negative for discharge.  Respiratory: Negative for shortness of breath.   Cardiovascular: Negative for chest pain, palpitations and leg swelling.  Gastrointestinal: Positive for abdominal pain. Negative for nausea and diarrhea.  Genitourinary: Negative for dysuria.  Musculoskeletal: Negative for falls.  Skin: Negative for rash.  Neurological: Negative for loss of consciousness and headaches.  Endo/Heme/Allergies: Negative for polydipsia.  Psychiatric/Behavioral: Negative for depression and suicidal ideas. The patient is nervous/anxious. The patient does not have insomnia.      Objective  BP 118/82  Pulse 80  Temp(Src) 97.6 F (36.4 C) (Oral)  Ht 5' 7.5" (1.715 m)  Wt 163 lb 1.9 oz (73.991 kg)  BMI 25.16 kg/m2  SpO2 97%  Physical Exam  Physical Exam  Constitutional: He is oriented to person, place, and time and well-developed, well-nourished, and in no distress. No distress.  HENT:  Head: Normocephalic and atraumatic.  Eyes: Conjunctivae are normal.  Neck: Neck supple. No thyromegaly present.  Cardiovascular:  Normal rate, regular rhythm and normal heart sounds.   No murmur heard. Pulmonary/Chest: Effort normal and breath sounds normal. No respiratory distress.  Abdominal: He exhibits no distension and no mass. There is no tenderness.  Musculoskeletal: He exhibits no edema.  Neurological: He is alert and oriented to person, place, and time.  Skin: Skin is warm.  Psychiatric: Memory, affect and judgment normal.    No results found for this basename: TSH   Lab Results  Component Value Date   WBC 3.9* 09/16/2011    HGB 15.2 09/16/2011   HCT 42.0 09/16/2011   MCV 85.5 09/16/2011   PLT 214 09/16/2011   Lab Results  Component Value Date   CREATININE 0.97 09/16/2011   BUN 11 09/16/2011   NA 140 09/16/2011   K 4.6 09/16/2011   CL 104 09/16/2011   CO2 28 09/16/2011   Lab Results  Component Value Date   ALT 15 09/16/2011   AST 18 09/16/2011   ALKPHOS 59 09/16/2011   BILITOT 0.4 09/16/2011     Assessment & Plan  Stress reaction Still struggling with ongoing stressors being out of work but lexapro is helping.  ELEVATED BLOOD PRESSURE WITHOUT DIAGNOSIS OF HYPERTENSION Adequately controlled today  Pelvic floor dysfunction Ongoing pain is still occuring. If he is able to return to work he will need to maintain lifting restrictions and keep to day shifts secondary to his pain is worse with fatigue

## 2012-08-10 NOTE — Assessment & Plan Note (Signed)
Adequately controlled today 

## 2012-08-10 NOTE — Assessment & Plan Note (Signed)
Still struggling with ongoing stressors being out of work but lexapro is helping.

## 2012-08-10 NOTE — Assessment & Plan Note (Addendum)
Ongoing pain is still occuring. If he is able to return to work he will need to maintain lifting restrictions and keep to day shifts secondary to his pain is worse with fatigue

## 2012-09-06 ENCOUNTER — Ambulatory Visit: Payer: Federal, State, Local not specified - PPO | Admitting: Family Medicine

## 2012-09-07 ENCOUNTER — Ambulatory Visit: Payer: Federal, State, Local not specified - PPO | Admitting: Family Medicine

## 2012-09-13 ENCOUNTER — Ambulatory Visit: Payer: Federal, State, Local not specified - PPO | Admitting: Family Medicine

## 2012-09-14 ENCOUNTER — Ambulatory Visit (INDEPENDENT_AMBULATORY_CARE_PROVIDER_SITE_OTHER): Payer: Federal, State, Local not specified - PPO | Admitting: Family Medicine

## 2012-09-14 ENCOUNTER — Encounter: Payer: Self-pay | Admitting: Family Medicine

## 2012-09-14 ENCOUNTER — Telehealth: Payer: Self-pay | Admitting: Family Medicine

## 2012-09-14 VITALS — BP 130/86 | HR 85 | Temp 97.5°F | Ht 67.5 in | Wt 165.1 lb

## 2012-09-14 DIAGNOSIS — R109 Unspecified abdominal pain: Secondary | ICD-10-CM

## 2012-09-14 DIAGNOSIS — R030 Elevated blood-pressure reading, without diagnosis of hypertension: Secondary | ICD-10-CM

## 2012-09-14 DIAGNOSIS — R4589 Other symptoms and signs involving emotional state: Secondary | ICD-10-CM

## 2012-09-14 DIAGNOSIS — Z0000 Encounter for general adult medical examination without abnormal findings: Secondary | ICD-10-CM

## 2012-09-14 DIAGNOSIS — R300 Dysuria: Secondary | ICD-10-CM

## 2012-09-14 DIAGNOSIS — N411 Chronic prostatitis: Secondary | ICD-10-CM

## 2012-09-14 DIAGNOSIS — N400 Enlarged prostate without lower urinary tract symptoms: Secondary | ICD-10-CM

## 2012-09-14 DIAGNOSIS — F411 Generalized anxiety disorder: Secondary | ICD-10-CM

## 2012-09-14 DIAGNOSIS — M6289 Other specified disorders of muscle: Secondary | ICD-10-CM

## 2012-09-14 NOTE — Patient Instructions (Addendum)
Fasting labs with lipid prior to next visit if possible Benign Prostatic Hypertrophy  The prostate gland is part of the reproductive system of men. A normal prostate is about the size and shape of a walnut. The prostate gland makes a fluid that is mixed with sperm to make semen. This gland surrounds the urethra and is located in front of the rectum and just below the bladder. The bladder is where urine is stored. The urethra is the tube through which urine passes from the bladder to get out of the body. The prostate grows as a man ages. An enlarged prostate not caused by cancer is called benign prostatic hypertrophy (BPH). This is a common health problem in men over age 32. This condition is a normal part of aging. An enlarged prostate presses on the urethra. This makes it harder to pass urine. In the early stages of enlargement, the bladder can get by with a narrowed urethra by forcing the urine through. If the problem gets worse, medical or surgical treatment may be required.  This condition should be followed by your caregiver. Longstanding back pressure on the kidneys can cause infection. Back pressure and infection can progress to bladder damage and kidney (renal) failure. If needed, your caregiver may refer you to a specialist in kidney and prostate disease (urologist). CAUSES  The exact cause is not known.  SYMPTOMS   You are not able to completely empty your bladder.  Getting up often during the night to urinate.  Need to urinate frequently during the day.  Difficultly in starting urine flow.  Decrease in size and strength of the urine stream.  Dribbling after urination.  Pain on urination (more common with infection).  Inability to pass your water. This needs immediate treatment. DIAGNOSIS  These tests will help your caregiver understand your problem:  Digital rectal exam (DRE). In a rectal exam, your caregiver checks your prostate by putting a gloved, lubricated finger into the  rectum to feel the back of your prostate gland. This exam detects the size of the gland and abnormal lumps or growths.  Urinalysis (exam of the urine). This may include a culture if there is concern about infection.  Prostate Specific Antigen (PSA). This is a blood test used to screen for prostate cancer. It is not used alone for diagnosing prostate cancer.  Rectal ultrasound (sonogram). This test uses sound waves to electronically produce a "picture" of the prostate. It helps examine the prostate gland for cancer. TREATMENT  Mild symptoms may not need treatment. Simple observation and yearly exams may be all that is required. Medications and surgery are options for more severe problems. Your caregiver can help you make an informed decision for what is best. Two classes of medications are available for relief of prostate symptoms:  Medications that shrink the prostate. This helps relieve symptoms.  Uncommon side effects include problems with sexual function.  Medications to relax the muscle of the prostate. This also relieves the obstruction.  Side effects can include dizziness, fatigue, lightheadedness, and retrograde ejaculation (diminished volume of ejaculate). Several types of surgical treatments are available for relief of prostate symptoms:  Transurethral resection of the prostate (TURP). In this treatment, an instrument is inserted through opening at the tip of the penis. It is used to cut away pieces of the inner core of the prostate. The pieces are removed through the same opening of the penis. This removes the obstruction and helps get rid of the symptoms.  Transurethral incision (TUIP). In  this procedure, small cuts are made in the prostate. This lessens the prostates pressure on the urethra.  Transurethral microwave thermotherapy (TUMT). This procedure uses microwaves to create heat. The heat destroys and removes a small amount of prostate tissue.  Transurethral needle ablation  (TUNA). This is a procedure that uses radio frequencies to do the same as TUMT.  Interstitial laser coagulation (ILC). This is a procedure that uses a laser to do the same as TUMT and TUNA.  Transurethral electrovaporization (TUVP). This is a procedure that uses electrodes to do the same as the procedures listed above. Regardless of the method of treatment chosen, you and your caregiver will discuss the options. With this knowledge, you along with your caregiver can decide upon the best treatment for you. SEEK MEDICAL CARE IF:   You develop chills, fever of 100.5 F (38.1 C), or night sweats.  There is unexplained back pain.  Symptoms are not helped by medications prescribed.  You develop medication side effects.  Your urine becomes very dark or has a bad smell. SEEK IMMEDIATE MEDICAL CARE IF:   You are suddenly unable to urinate. This is an emergency. You should be seen immediately.  There are large amounts of blood or clots in the urine.  Your urinary problems become unmanageable.  You develop lightheadedness, severe dizziness, or you feel faint.  You develop moderate to severe low back or flank pain.  You develop chills or fever. Document Released: 02/24/2005 Document Revised: 05/19/2011 Document Reviewed: 11/16/2006 [facility] Patient Information 2014 [place], [place].

## 2012-09-14 NOTE — Assessment & Plan Note (Signed)
Good response to Lexapro continue the same 

## 2012-09-14 NOTE — Assessment & Plan Note (Signed)
Well controlled today, no changes 

## 2012-09-14 NOTE — Assessment & Plan Note (Addendum)
Increased pain since returning to work. Continue PT and work restrictions such as no lifting over 20#

## 2012-09-14 NOTE — Telephone Encounter (Signed)
Fasting labs with lipid prior to next visit if possible   Patient has appointment in early august/2014. He will be going to [facility] lab

## 2012-09-14 NOTE — Assessment & Plan Note (Signed)
Recent CT scan noted enlarged prostate, referred to urology for further evaluation

## 2012-09-14 NOTE — Progress Notes (Signed)
Patient ID: Jared Myers, male   DOB: 1980-09-02, 32 y.o.   MRN: [id number] Jared Myers [id number] [birth year] 09/14/2012      Progress Note-Follow Up  Subjective  Chief Complaint  Chief Complaint  Patient presents with  . Follow-up    HPI  Patient is a 32 year old Caucasian male who today in followup. He has returned to work and unfortunately they have not been able to accommodate his work restrictions will hasten lifting over 20 pounds. As result, or pelvic pain and abdominal pain his notes back pain as well. Has restarted his physical therapy and they have noticed a great increase in spasm. Is noting also some gaseousness but no other change in bowel habits. No bloody or tarry stool. Does have some mild dysuria. Denies chest pain or fevers. No shortness of breath, palpitations or  Past Medical History  Diagnosis Date  . Premature ventricular beat   . Pelvic floor dysfunction   . Acute upper respiratory infections of unspecified site 04/28/2012  . 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 Inguinal hernia 05/12/2012    History reviewed. No pertinent past surgical history.  History reviewed. No pertinent family history.  History   Social History  . Marital Status: Divorced    Spouse Name: N/A    Number of Children: N/A  . Years of Education: N/A   Occupational History  . Not on file.   Social History Main Topics  . Smoking status: Never Smoker   . Smokeless tobacco: Not on file  . Alcohol Use: No  . Drug Use: No  . Sexually Active: No   Other Topics Concern  . Not on file   Social History Narrative  . No narrative on file    Current Outpatient Prescriptions on File Prior to Visit  Medication Sig Dispense Refill  . escitalopram (LEXAPRO) 20 MG tablet Take 1 tablet (20 mg total) by mouth daily.  30 tablet  3  . HYDROcodone-acetaminophen (NORCO) 5-325 MG per tablet Take 1 tablet by mouth every 6 (six) hours as needed for pain.  30 tablet  0  . hyoscyamine  (LEVSIN SL) 0.125 MG SL tablet Place 1 tablet (0.125 mg total) under the tongue every 4 (four) hours as needed for cramping (abdominal pain).  30 tablet  0   No current facility-administered medications on file prior to visit.    Allergies  Allergen Reactions  . Guaifenesin     REACTION: PVCs  . Prednisone     PVC    Review of Systems  Review of Systems  Constitutional: Negative for fever and malaise/fatigue.  HENT: Negative for congestion.   Eyes: Negative for pain and discharge.  Respiratory: Negative for shortness of breath.   Cardiovascular: Negative for chest pain, palpitations and leg swelling.  Gastrointestinal: Positive for abdominal pain. Negative for nausea and diarrhea.  Genitourinary: Positive for dysuria.  Musculoskeletal: Positive for back pain. Negative for falls.  Skin: Negative for rash.  Neurological: Negative for loss of consciousness and headaches.  Endo/Heme/Allergies: Negative for polydipsia.  Psychiatric/Behavioral: Negative for depression and suicidal ideas. The patient is not nervous/anxious and does not have insomnia.     Objective  BP 130/86  Pulse 85  Temp(Src) 97.5 F (36.4 C) (Oral)  Ht 5' 7.5" (1.715 m)  Wt 165 lb 1.3 oz (74.88 kg)  BMI 25.46 kg/m2  SpO2 97%  Physical Exam  Physical Exam  Constitutional: He is oriented to person, place, and time and well-developed, well-nourished, and in  no distress. No distress.  HENT:  Head: Normocephalic and atraumatic.  Eyes: Conjunctivae are normal.  Neck: Neck supple. No thyromegaly present.  Cardiovascular: Normal rate, regular rhythm and normal heart sounds.   No murmur heard. Pulmonary/Chest: Effort normal and breath sounds normal. No respiratory distress.  Abdominal: He exhibits no distension and no mass. There is no tenderness.  Musculoskeletal: He exhibits no edema.  Neurological: He is alert and oriented to person, place, and time.  Skin: Skin is warm.  Psychiatric: Memory, affect and  judgment normal.    No results found for this basename: TSH   Lab Results  Component Value Date   WBC 3.9* 09/16/2011   HGB 15.2 09/16/2011   HCT 42.0 09/16/2011   MCV 85.5 09/16/2011   PLT 214 09/16/2011   Lab Results  Component Value Date   CREATININE 0.97 09/16/2011   BUN 11 09/16/2011   NA 140 09/16/2011   K 4.6 09/16/2011   CL 104 09/16/2011   CO2 28 09/16/2011   Lab Results  Component Value Date   ALT 15 09/16/2011   AST 18 09/16/2011   ALKPHOS 59 09/16/2011   BILITOT 0.4 09/16/2011     Assessment & Plan  ELEVATED BLOOD PRESSURE WITHOUT DIAGNOSIS OF HYPERTENSION Well controlled today, no changes  Pelvic floor dysfunction Increased pain since returning to work. Continue PT and work restrictions such as no lifting over 20#  PROSTATITIS, CHRONIC Recent CT scan noted enlarged prostate, referred to urology for further evaluation  Anxiety in acute stress reaction Good response to Lexapro continue the same

## 2012-09-15 LAB — RENAL FUNCTION PANEL
Albumin: 4.7 g/dL (ref 3.5–5.2)
CO2: 27 mEq/L (ref 19–32)
Chloride: 100 mEq/L (ref 96–112)
Creat: 0.98 mg/dL (ref 0.50–1.35)
Glucose, Bld: 151 mg/dL — ABNORMAL HIGH (ref 70–99)

## 2012-09-15 LAB — CBC
HCT: 43.2 % (ref 39.0–52.0)
Hemoglobin: 15.7 g/dL (ref 13.0–17.0)
MCH: 30.4 pg (ref 26.0–34.0)
MCV: 83.7 fL (ref 78.0–100.0)
RBC: 5.16 MIL/uL (ref 4.22–5.81)

## 2012-09-15 LAB — HEPATIC FUNCTION PANEL
ALT: 17 U/L (ref 0–53)
AST: 16 U/L (ref 0–37)
Albumin: 4.7 g/dL (ref 3.5–5.2)
Alkaline Phosphatase: 59 U/L (ref 39–117)

## 2012-09-15 LAB — URINALYSIS
Glucose, UA: NEGATIVE mg/dL
Leukocytes, UA: NEGATIVE
Protein, ur: NEGATIVE mg/dL
Specific Gravity, Urine: 1.024 (ref 1.005–1.030)

## 2012-09-15 LAB — TSH: TSH: 0.45 u[IU]/mL (ref 0.350–4.500)

## 2012-09-16 LAB — URINE CULTURE: Colony Count: NO GROWTH

## 2012-09-28 ENCOUNTER — Ambulatory Visit (INDEPENDENT_AMBULATORY_CARE_PROVIDER_SITE_OTHER): Payer: Federal, State, Local not specified - PPO | Admitting: Family Medicine

## 2012-09-28 ENCOUNTER — Encounter: Payer: Self-pay | Admitting: Family Medicine

## 2012-09-28 VITALS — BP 132/84 | HR 68 | Temp 98.5°F | Ht 67.5 in | Wt 166.1 lb

## 2012-09-28 DIAGNOSIS — IMO0002 Reserved for concepts with insufficient information to code with codable children: Secondary | ICD-10-CM

## 2012-09-28 DIAGNOSIS — R030 Elevated blood-pressure reading, without diagnosis of hypertension: Secondary | ICD-10-CM

## 2012-09-28 DIAGNOSIS — F430 Acute stress reaction: Secondary | ICD-10-CM

## 2012-09-28 DIAGNOSIS — R109 Unspecified abdominal pain: Secondary | ICD-10-CM

## 2012-09-28 DIAGNOSIS — M6289 Other specified disorders of muscle: Secondary | ICD-10-CM

## 2012-09-28 MED ORDER — HYDROCODONE-ACETAMINOPHEN 5-325 MG PO TABS: 1.0000 | ORAL_TABLET | Freq: Four times a day (QID) | ORAL | Status: DC | PRN

## 2012-09-28 NOTE — Patient Instructions (Signed)
Salon pas patches to low back as needed  Back Pain, Adult Low back pain is very common. About 1 in 5 people have back pain.The cause of low back pain is rarely dangerous. The pain often gets better over time.About half of people with a sudden onset of back pain feel better in just 2 weeks. About 8 in 10 people feel better by 6 weeks.  CAUSES Some common causes of back pain include:  Strain of the muscles or ligaments supporting the spine.  Wear and tear (degeneration) of the spinal discs.  Arthritis.  Direct injury to the back. DIAGNOSIS Most of the time, the direct cause of low back pain is not known.However, back pain can be treated effectively even when the exact cause of the pain is unknown.Answering your caregiver's questions about your overall health and symptoms is one of the most accurate ways to make sure the cause of your pain is not dangerous. If your caregiver needs more information, he or she may order lab work or imaging tests (X-rays or MRIs).However, even if imaging tests show changes in your back, this usually does not require surgery. HOME CARE INSTRUCTIONS For many people, back pain returns.Since low back pain is rarely dangerous, it is often a condition that people can learn to [facility] their own.   Remain active. It is stressful on the back to sit or stand in one place. Do not sit, drive, or stand in one place for more than 30 minutes at a time. Take short walks on level surfaces as soon as pain allows.Try to increase the length of time you walk each day.  Do not stay in bed.Resting more than 1 or 2 days can delay your recovery.  Do not avoid exercise or work.Your body is made to move.It is not dangerous to be active, even though your back may hurt.Your back will likely heal faster if you return to being active before your pain is gone.  Pay attention to your body when you bend and lift. Many people have less discomfortwhen lifting if they bend their knees,  keep the load close to their bodies,and avoid twisting. Often, the most comfortable positions are those that put less stress on your recovering back.  Find a comfortable position to sleep. Use a firm mattress and lie on your side with your knees slightly bent. If you lie on your back, put a pillow under your knees.  Only take over-the-counter or prescription medicines as directed by your caregiver. Over-the-counter medicines to reduce pain and inflammation are often the most helpful.Your caregiver may prescribe muscle relaxant drugs.These medicines help dull your pain so you can more quickly return to your normal activities and healthy exercise.  Put ice on the injured area.  Put ice in a plastic bag.  Place a towel between your skin and the bag.  Leave the ice on for 15-20 minutes, 3-4 times a day for the first 2 to 3 days. After that, ice and heat may be alternated to reduce pain and spasms.  Ask your caregiver about trying back exercises and gentle massage. This may be of some benefit.  Avoid feeling anxious or stressed.Stress increases muscle tension and can worsen back pain.It is important to recognize when you are anxious or stressed and learn ways to manage it.Exercise is a great option. SEEK MEDICAL CARE IF:  You have pain that is not relieved with rest or medicine.  You have pain that does not improve in 1 week.  You have new  symptoms.  You are generally not feeling well. SEEK IMMEDIATE MEDICAL CARE IF:   You have pain that radiates from your back into your legs.  You develop new bowel or bladder control problems.  You have unusual weakness or numbness in your arms or legs.  You develop nausea or vomiting.  You develop abdominal pain.  You feel faint. Document Released: 02/24/2005 Document Revised: 08/26/2011 Document Reviewed: 07/15/2010 [facility] Patient Information 2014 [place], [place].

## 2012-09-28 NOTE — Assessment & Plan Note (Signed)
Adequate control today 

## 2012-09-28 NOTE — Assessment & Plan Note (Addendum)
Continuing to have daily pain, is still having pain especially in LLQ, is seeing PT and massage therapy for the low back dysfunction due to h/o scoliosis but also over use at work. Encouraged no heavy lifting over 20#, no repetitive, lifting, bending, squatting. Cannot work night shifts due to increased pain with fatigue

## 2012-09-29 NOTE — Assessment & Plan Note (Addendum)
Referred to urology for evaluation of prostatitis and possible enlarged prostate

## 2012-09-29 NOTE — Assessment & Plan Note (Signed)
Doing somewhat better on Lexapro, continue the same

## 2012-09-29 NOTE — Progress Notes (Signed)
Patient ID: Jared Myers, male   DOB: November 28, 1980, 32 y.o.   MRN: [id number] [place] [id number] [birth year] 09/29/2012      Progress Note-Follow Up  Subjective  Chief Complaint  Chief Complaint  Patient presents with  . Follow-up    HPI  Patient is a 32 year old Caucasian male who is in today with ongoing concerns. He has returned to work and continues to struggle but is trying to work routinely. He is pelvic pain is still responding to physical therapy but he is able to do it but he has been unable to it frequently. He does have a urology appointment next month for his possible enlarged prostate and his history of recurrent prostatitis. He continues to have some low back pain as well which can radiate around to his abdomen. No recent falls. He has so far been able to stay off of night shift but doesn't note having to lift greater than 20 pounds at work at times this does aggravate his pain. Lexapro is helping his mood and his anxiety over his current situation and he offers no other acute complaints. No chest pain or palpitations. No shortness of breath  Past Medical History  Diagnosis Date  . Premature ventricular beat   . Pelvic floor dysfunction   . Acute upper respiratory infections of unspecified site 04/28/2012  . 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 Inguinal hernia 05/12/2012    History reviewed. No pertinent past surgical history.  History reviewed. No pertinent family history.  History   Social History  . Marital Status: Divorced    Spouse Name: N/A    Number of Children: N/A  . Years of Education: N/A   Occupational History  . Not on file.   Social History Main Topics  . Smoking status: Never Smoker   . Smokeless tobacco: Not on file  . Alcohol Use: No  . Drug Use: No  . Sexually Active: No   Other Topics Concern  . Not on file   Social History Narrative  . No narrative on file    Current Outpatient Prescriptions on File Prior to Visit   Medication Sig Dispense Refill  . escitalopram (LEXAPRO) 20 MG tablet Take 1 tablet (20 mg total) by mouth daily.  30 tablet  3  . hyoscyamine (LEVSIN SL) 0.125 MG SL tablet Place 1 tablet (0.125 mg total) under the tongue every 4 (four) hours as needed for cramping (abdominal pain).  30 tablet  0   No current facility-administered medications on file prior to visit.    Allergies  Allergen Reactions  . Guaifenesin     REACTION: PVCs  . Prednisone     PVC    Review of Systems  Review of Systems  Constitutional: Negative for fever and malaise/fatigue.  HENT: Negative for congestion.   Eyes: Negative for pain and discharge.  Respiratory: Negative for shortness of breath.   Cardiovascular: Negative for chest pain, palpitations and leg swelling.  Gastrointestinal: Positive for abdominal pain. Negative for nausea and diarrhea.  Genitourinary: Positive for frequency. Negative for dysuria.  Musculoskeletal: Negative for falls.  Skin: Negative for rash.  Neurological: Negative for loss of consciousness and headaches.  Endo/Heme/Allergies: Negative for polydipsia.  Psychiatric/Behavioral: Negative for depression and suicidal ideas. The patient is nervous/anxious. The patient does not have insomnia.     Objective  BP 132/84  Pulse 68  Temp(Src) 98.5 F (36.9 C) (Oral)  Ht 5' 7.5" (1.715 m)  Wt 166 lb 1.3 oz (  75.333 kg)  BMI 25.61 kg/m2  SpO2 98%  Physical Exam  Physical Exam  Constitutional: He is oriented to person, place, and time and well-developed, well-nourished, and in no distress. No distress.  HENT:  Head: Normocephalic and atraumatic.  Eyes: Conjunctivae are normal.  Neck: Neck supple. No thyromegaly present.  Cardiovascular: Normal rate, regular rhythm and normal heart sounds.  Exam reveals no gallop.   No murmur heard. Pulmonary/Chest: Effort normal and breath sounds normal. No respiratory distress.  Abdominal: He exhibits no distension and no mass. There is no  tenderness.  Musculoskeletal: He exhibits no edema.  Neurological: He is alert and oriented to person, place, and time.  Skin: Skin is warm.  Psychiatric: Memory, affect and judgment normal.    Lab Results  Component Value Date   TSH 0.450 09/14/2012   Lab Results  Component Value Date   WBC 8.6 09/14/2012   HGB 15.7 09/14/2012   HCT 43.2 09/14/2012   MCV 83.7 09/14/2012   PLT 288 09/14/2012   Lab Results  Component Value Date   CREATININE 0.98 09/14/2012   BUN 13 09/14/2012   NA 137 09/14/2012   K 3.9 09/14/2012   CL 100 09/14/2012   CO2 27 09/14/2012   Lab Results  Component Value Date   ALT 17 09/14/2012   AST 16 09/14/2012   ALKPHOS 59 09/14/2012   BILITOT 0.7 09/14/2012     Assessment & Plan  Pelvic floor dysfunction Continuing to have daily pain, is still having pain especially in LLQ, is seeing PT and massage therapy for the low back dysfunction due to h/o scoliosis but also over use at work. Encouraged no heavy lifting over 20#, no repetitive, lifting, bending, squatting. Cannot work night shifts due to increased pain with fatigue  ELEVATED BLOOD PRESSURE WITHOUT DIAGNOSIS OF HYPERTENSION Adequate control today  Stress reaction Doing somewhat better on Lexapro, continue the same  GENITOURINARY DISORDER Referred to urology for evaluation of prostatitis and possible enlarged prostate

## 2012-10-11 ENCOUNTER — Telehealth: Payer: Self-pay

## 2012-10-11 NOTE — Telephone Encounter (Signed)
FYI:   Patient called stating that he saw the urologist today and was put on Flomax. Pt stated that urologist is going to fax the visit summary to Dr Abner Greenspan but pt felt that MD "flaunted" that she was a surgeon and she believes pt was misdiagnosed and that something else could be the issue (pt couldn't remember was urologist called it). Pt stated that urologist wasn't mean but didn't want to hear what he had to say. Pt also stated that urologist would not do any of the work paperwork and if Dr Abner Greenspan wanted to "jump on that boat" Dr Abner Greenspan could do it.   Pt just wanted MD to be informed

## 2012-10-12 ENCOUNTER — Ambulatory Visit: Payer: Federal, State, Local not specified - PPO | Admitting: Family Medicine

## 2012-11-02 ENCOUNTER — Ambulatory Visit: Payer: Federal, State, Local not specified - PPO | Admitting: Family Medicine

## 2012-11-12 ENCOUNTER — Ambulatory Visit: Payer: Federal, State, Local not specified - PPO | Admitting: Family Medicine

## 2012-11-22 ENCOUNTER — Ambulatory Visit (INDEPENDENT_AMBULATORY_CARE_PROVIDER_SITE_OTHER): Payer: Federal, State, Local not specified - PPO | Admitting: Family Medicine

## 2012-11-22 ENCOUNTER — Encounter: Payer: Self-pay | Admitting: Family Medicine

## 2012-11-22 ENCOUNTER — Ambulatory Visit (HOSPITAL_BASED_OUTPATIENT_CLINIC_OR_DEPARTMENT_OTHER)
Admission: RE | Admit: 2012-11-22 | Discharge: 2012-11-22 | Disposition: A | Discharge status: Home / Self Care | Payer: Federal, State, Local not specified - PPO | Source: Ambulatory Visit | Attending: Family Medicine | Admitting: Family Medicine

## 2012-11-22 VITALS — BP 128/88 | HR 90 | Temp 98.1°F | Ht 67.5 in | Wt 170.1 lb

## 2012-11-22 DIAGNOSIS — F341 Dysthymic disorder: Secondary | ICD-10-CM

## 2012-11-22 DIAGNOSIS — M545 Low back pain, unspecified: Secondary | ICD-10-CM | POA: Insufficient documentation

## 2012-11-22 DIAGNOSIS — F411 Generalized anxiety disorder: Secondary | ICD-10-CM

## 2012-11-22 DIAGNOSIS — R030 Elevated blood-pressure reading, without diagnosis of hypertension: Secondary | ICD-10-CM

## 2012-11-22 DIAGNOSIS — R4589 Other symptoms and signs involving emotional state: Secondary | ICD-10-CM

## 2012-11-22 DIAGNOSIS — M6289 Other specified disorders of muscle: Secondary | ICD-10-CM

## 2012-11-22 DIAGNOSIS — Z23 Encounter for immunization: Secondary | ICD-10-CM

## 2012-11-22 DIAGNOSIS — G8929 Other chronic pain: Secondary | ICD-10-CM | POA: Insufficient documentation

## 2012-11-22 DIAGNOSIS — F418 Other specified anxiety disorders: Secondary | ICD-10-CM

## 2012-11-22 MED ORDER — ESCITALOPRAM OXALATE 10 MG PO TABS: ORAL_TABLET | ORAL | Status: DC

## 2012-11-22 MED ORDER — CYCLOBENZAPRINE HCL 10 MG PO TABS: 10.0000 mg | ORAL_TABLET | Freq: Two times a day (BID) | ORAL | Status: DC | PRN

## 2012-11-22 NOTE — Patient Instructions (Addendum)
Consider a calcium/magneisum/Zinc tab daily Probiotic and fiber, Benefiber  Senna S or Pericolace (Docusate/Sennakot) daily   Insomnia Insomnia is frequent trouble falling and/or staying asleep. Insomnia can be a long term problem or a short term problem. Both are common. Insomnia can be a short term problem when the wakefulness is related to a certain stress or worry. Long term insomnia is often related to ongoing stress during waking hours and/or poor sleeping habits. Overtime, sleep deprivation itself can make the problem worse. Every little thing feels more severe because you are overtired and your ability to cope is decreased. CAUSES   Stress, anxiety, and depression.  Poor sleeping habits.  Distractions such as TV in the bedroom.  Naps close to bedtime.  Engaging in emotionally charged conversations before bed.  Technical reading before sleep.  Alcohol and other sedatives. They may make the problem worse. They can hurt normal sleep patterns and normal dream activity.  Stimulants such as caffeine for several hours prior to bedtime.  Pain syndromes and shortness of breath can cause insomnia.  Exercise late at night.  Changing time zones may cause sleeping problems (jet lag). It is sometimes helpful to have someone observe your sleeping patterns. They should look for periods of not breathing during the night (sleep apnea). They should also look to see how long those periods last. If you live alone or observers are uncertain, you can also be observed at a sleep clinic where your sleep patterns will be professionally monitored. Sleep apnea requires a checkup and treatment. Give your caregivers your medical history. Give your caregivers observations your family has made about your sleep.  SYMPTOMS   Not feeling rested in the morning.  Anxiety and restlessness at bedtime.  Difficulty falling and staying asleep. TREATMENT   Your caregiver may prescribe treatment for an  underlying medical disorders. Your caregiver can give advice or help if you are using alcohol or other drugs for self-medication. Treatment of underlying problems will usually eliminate insomnia problems.  Medications can be prescribed for short time use. They are generally not recommended for lengthy use.  Over-the-counter sleep medicines are not recommended for lengthy use. They can be habit forming.  You can promote easier sleeping by making lifestyle changes such as:  Using relaxation techniques that help with breathing and reduce muscle tension.  Exercising earlier in the day.  Changing your diet and the time of your last meal. No night time snacks.  Establish a regular time to go to bed.  Counseling can help with stressful problems and worry.  Soothing music and white noise may be helpful if there are background noises you cannot remove.  Stop tedious detailed work at least one hour before bedtime. HOME CARE INSTRUCTIONS   Keep a diary. Inform your caregiver about your progress. This includes any medication side effects. See your caregiver regularly. Take note of:  Times when you are asleep.  Times when you are awake during the night.  The quality of your sleep.  How you feel the next day. This information will help your caregiver care for you.  Get out of bed if you are still awake after 15 minutes. Read or do some quiet activity. Keep the lights down. Wait until you feel sleepy and go back to bed.  Keep regular sleeping and waking hours. Avoid naps.  Exercise regularly.  Avoid distractions at bedtime. Distractions include watching television or engaging in any intense or detailed activity like attempting to balance the household checkbook.  Develop  a bedtime ritual. Keep a familiar routine of bathing, brushing your teeth, climbing into bed at the same time each night, listening to soothing music. Routines increase the success of falling to sleep faster.  Use  relaxation techniques. This can be using breathing and muscle tension release routines. It can also include visualizing peaceful scenes. You can also help control troubling or intruding thoughts by keeping your mind occupied with boring or repetitive thoughts like the old concept of counting sheep. You can make it more creative like imagining planting one beautiful flower after another in your backyard garden.  During your day, work to eliminate stress. When this is not possible use some of the previous suggestions to help reduce the anxiety that accompanies stressful situations. MAKE SURE YOU:   Understand these instructions.  Will watch your condition.  Will get help right away if you are not doing well or get worse. Document Released: 02/22/2000 Document Revised: 05/19/2011 Document Reviewed: 03/24/2007 [facility] Patient Information 2014 [place], [place].

## 2012-11-22 NOTE — Progress Notes (Signed)
Patient ID: Jared Myers, male   DOB: [birth year], 32 y.o.   MRN: [id number] Jared Myers [id number] 01-05-1981 11/22/2012      Progress Note-Follow Up  Subjective  Chief Complaint  Chief Complaint  Patient presents with  . Follow-up    and discuss STD  . Injections    flu    HPI  Patient is a 32 year old Caucasian male who is in today for followup. Continues to have pelvic pain and now having worsening low back pain as well. Is having some rectal discomfort as well. Has ongoing trouble at work. 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 Chest and gets regular physical therapy his pelvic pain is improved. No recent illness but he does acknowledge stress and anxiety continues to be an issue. Does have some trouble with dysuria from time to time but no hematuria. No GI complaints. No fevers, chest pain or palpitations noted  Past Medical History  Diagnosis Date  . Premature ventricular beat   . Pelvic floor dysfunction   . Acute upper respiratory infections of unspecified site 04/28/2012  . 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 Inguinal hernia 05/12/2012    History reviewed. No pertinent past surgical history.  History reviewed. No pertinent family history.  History   Social History  . Marital Status: Divorced    Spouse Name: N/A    Number of Children: N/A  . Years of Education: N/A   Occupational History  . Not on file.   Social History Main Topics  . Smoking status: Never Smoker   . Smokeless tobacco: Not on file  . Alcohol Use: No  . Drug Use: No  . Sexual Activity: No   Other Topics Concern  . Not on file   Social History Narrative  . No narrative on file    Current Outpatient Prescriptions on File Prior to Visit  Medication Sig Dispense Refill  . escitalopram (LEXAPRO) 20 MG tablet Take 1 tablet (20 mg total) by mouth daily.  30 tablet  3  . HYDROcodone-acetaminophen (NORCO)  5-325 MG per tablet Take 1 tablet by mouth every 6 (six) hours as needed for pain.  30 tablet  1   No current facility-administered medications on file prior to visit.    Allergies  Allergen Reactions  . Guaifenesin     REACTION: PVCs  . Prednisone     PVC    Review of Systems  Review of Systems  Constitutional: Negative for fever and malaise/fatigue.  HENT: Negative for congestion.   Eyes: Negative for discharge.  Respiratory: Negative for shortness of breath.   Cardiovascular: Negative for chest pain, palpitations and leg swelling.  Gastrointestinal: Negative for nausea, abdominal pain and diarrhea.  Genitourinary: Negative for dysuria.  Musculoskeletal: Negative for falls.  Skin: Negative for rash.  Neurological: Negative for loss of consciousness and headaches.  Endo/Heme/Allergies: Negative for polydipsia.  Psychiatric/Behavioral: Negative for depression and suicidal ideas. The patient is not nervous/anxious and does not have insomnia.     Objective  BP 140/90  Pulse 90  Temp(Src) 98.1 F (36.7 C) (Oral)  Ht 5' 7.5" (1.715 m)  Wt 170 lb 1.3 oz (77.148 kg)  BMI 26.23 kg/m2  SpO2 97%  Physical Exam  Physical Exam  Constitutional: He is oriented to person, place, and time and well-developed, well-nourished, and in no distress. No distress.  HENT:  Head: Normocephalic and atraumatic.  Eyes: Conjunctivae are normal.  Neck: Neck  supple. No thyromegaly present.  Cardiovascular: Normal rate, regular rhythm and normal heart sounds.   No murmur heard. Pulmonary/Chest: Effort normal and breath sounds normal. No respiratory distress.  Abdominal: He exhibits no distension and no mass. There is no tenderness.  Musculoskeletal: He exhibits no edema.  Neurological: He is alert and oriented to person, place, and time.  Skin: Skin is warm.  Psychiatric: Memory, affect and judgment normal.    Lab Results  Component Value Date   TSH 0.450 09/14/2012   Lab Results   Component Value Date   WBC 8.6 09/14/2012   HGB 15.7 09/14/2012   HCT 43.2 09/14/2012   MCV 83.7 09/14/2012   PLT 288 09/14/2012   Lab Results  Component Value Date   CREATININE 0.98 09/14/2012   BUN 13 09/14/2012   NA 137 09/14/2012   K 3.9 09/14/2012   CL 100 09/14/2012   CO2 27 09/14/2012   Lab Results  Component Value Date   ALT 17 09/14/2012   AST 16 09/14/2012   ALKPHOS 59 09/14/2012   BILITOT 0.7 09/14/2012     Assessment & Plan  Pelvic floor dysfunction Continues to struggle with pelvic and back pain related to his pelvic floor dysfunction and his physical stressors at work. Pain ranges from 5-10 of 10 any given day. Does better when he has the time and finances to attend regular physical therapy appointments.  ELEVATED BLOOD PRESSURE WITHOUT DIAGNOSIS OF HYPERTENSION Mildly elevated today due to pain and anxiety. Will continue to monitor  Anxiety in acute stress reaction Is responding to the increase in Lexapro but continues to have significant stress in the workplace

## 2012-11-25 ENCOUNTER — Telehealth: Payer: Self-pay

## 2012-11-25 NOTE — Telephone Encounter (Signed)
Patient left a vm stating he needs a note from MD stating that he is out of work until 12-20-12 due to worsening pain. Pt stated that MD can add anything else if needed.  Fax to [phone] Attn: Plant Manager- Barbara Cower (not sure of last name)  Please advise?

## 2012-11-25 NOTE — Telephone Encounter (Signed)
Please print him a note taking him out of work til 12/20/12 for worsening pain due to chronic medical condtion. But without a signature we can only write to whom it may concern and he will need to p/u

## 2012-11-26 NOTE — Telephone Encounter (Signed)
Letter printed and put at front desk. Left a detailed message explaining this on patients vm

## 2012-11-27 NOTE — Assessment & Plan Note (Signed)
Continues to struggle with pelvic and back pain related to his pelvic floor dysfunction and his physical stressors at work. Pain ranges from 5-10 of 10 any given day. Does better when he has the time and finances to attend regular physical therapy appointments.

## 2012-11-27 NOTE — Assessment & Plan Note (Signed)
Is responding to the increase in Lexapro but continues to have significant stress in the workplace

## 2012-11-27 NOTE — Assessment & Plan Note (Signed)
Mildly elevated today due to pain and anxiety. Will continue to monitor

## 2012-12-20 ENCOUNTER — Encounter: Payer: Self-pay | Admitting: Family Medicine

## 2012-12-20 ENCOUNTER — Ambulatory Visit: Payer: Federal, State, Local not specified - PPO | Admitting: Family Medicine

## 2012-12-20 ENCOUNTER — Ambulatory Visit (INDEPENDENT_AMBULATORY_CARE_PROVIDER_SITE_OTHER): Payer: Federal, State, Local not specified - PPO | Admitting: Family Medicine

## 2012-12-20 VITALS — BP 122/84 | HR 78 | Temp 98.4°F | Ht 67.5 in | Wt 178.0 lb

## 2012-12-20 DIAGNOSIS — F411 Generalized anxiety disorder: Secondary | ICD-10-CM

## 2012-12-20 DIAGNOSIS — R4589 Other symptoms and signs involving emotional state: Secondary | ICD-10-CM

## 2012-12-20 DIAGNOSIS — M6289 Other specified disorders of muscle: Secondary | ICD-10-CM

## 2012-12-20 DIAGNOSIS — R109 Unspecified abdominal pain: Secondary | ICD-10-CM

## 2012-12-20 NOTE — Progress Notes (Signed)
Patient ID: Jared Myers, male   DOB: 1980-04-23, 32 y.o.   MRN: [id number] Jared Myers [id number] Jun 10, 1980 12/20/2012      Progress Note-Follow Up  Subjective  Chief Complaint  Chief Complaint  Patient presents with  . Follow-up    1 month    HPI  Patient is a 32 year old Caucasian male who is in today for followup. He continues to have chronic pelvic pain. Notes it is worse when he's been unable to go for physical therapy perhaps to engage in a long car ride. Also has trouble with heavy lifting. Pain is worse at the end of the day and with increased stress. He acknowledges he is in negotiations with the [employer] regarding her need to accommodate his restrictions. He is helpful will go well. He is in need of a work site with an hour of his home to minimize his pain. He also is hoping to obtain a supervisory position which will allow for less lifting. He is unable to lift without significant discomfort. No chest pain or palpitations. No shortness of breath  Past Medical History  Diagnosis Date  . Premature ventricular beat   . Pelvic floor dysfunction   . Acute upper respiratory infections of unspecified site 04/28/2012  . 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 Inguinal hernia 05/12/2012    History reviewed. No pertinent past surgical history.  History reviewed. No pertinent family history.  History   Social History  . Marital Status: Divorced    Spouse Name: N/A    Number of Children: N/A  . Years of Education: N/A   Occupational History  . Not on file.   Social History Main Topics  . Smoking status: Never Smoker   . Smokeless tobacco: Not on file  . Alcohol Use: No  . Drug Use: No  . Sexual Activity: No   Other Topics Concern  . Not on file   Social History Narrative  . No narrative on file    Current Outpatient Prescriptions on File Prior to Visit  Medication Sig Dispense Refill  . cyclobenzaprine (FLEXERIL) 10 MG tablet Take 1 tablet (10  mg total) by mouth 2 (two) times daily as needed for muscle spasms.  40 tablet  1  . escitalopram (LEXAPRO) 10 MG tablet Add 10 mg tab in evening and continue 20 mg tab in am  30 tablet  1  . escitalopram (LEXAPRO) 20 MG tablet Take 1 tablet (20 mg total) by mouth daily.  30 tablet  3  . HYDROcodone-acetaminophen (NORCO) 5-325 MG per tablet Take 1 tablet by mouth every 6 (six) hours as needed for pain.  30 tablet  1   No current facility-administered medications on file prior to visit.    Allergies  Allergen Reactions  . Guaifenesin     REACTION: PVCs  . Prednisone     PVC    Review of Systems  Review of Systems  Constitutional: Negative for fever and malaise/fatigue.  HENT: Negative for congestion.   Eyes: Negative for discharge.  Respiratory: Negative for shortness of breath.   Cardiovascular: Negative for chest pain, palpitations and leg swelling.  Gastrointestinal: Positive for abdominal pain. Negative for nausea and diarrhea.  Genitourinary: Positive for urgency. Negative for dysuria.  Musculoskeletal: Negative for falls.  Skin: Negative for rash.  Neurological: Negative for loss of consciousness and headaches.  Endo/Heme/Allergies: Negative for polydipsia.  Psychiatric/Behavioral: Positive for depression. Negative for suicidal ideas. The patient is nervous/anxious. The patient does not  have insomnia.     Objective  BP 122/84  Pulse 78  Temp(Src) 98.4 F (36.9 C) (Oral)  Ht 5' 7.5" (1.715 m)  Wt 178 lb (80.74 kg)  BMI 27.45 kg/m2  SpO2 97%  Physical Exam  Physical Exam  Constitutional: He is oriented to person, place, and time and well-developed, well-nourished, and in no distress. No distress.  HENT:  Head: Normocephalic and atraumatic.  Eyes: Conjunctivae are normal.  Neck: Neck supple. No thyromegaly present.  Cardiovascular: Normal rate, regular rhythm and normal heart sounds.   No murmur heard. Pulmonary/Chest: Effort normal and breath sounds normal. No  respiratory distress.  Abdominal: He exhibits no distension and no mass. There is no tenderness.  Musculoskeletal: He exhibits no edema.  Neurological: He is alert and oriented to person, place, and time.  Skin: Skin is warm.  Psychiatric: Memory, affect and judgment normal.    Lab Results  Component Value Date   TSH 0.450 09/14/2012   Lab Results  Component Value Date   WBC 8.6 09/14/2012   HGB 15.7 09/14/2012   HCT 43.2 09/14/2012   MCV 83.7 09/14/2012   PLT 288 09/14/2012   Lab Results  Component Value Date   CREATININE 0.98 09/14/2012   BUN 13 09/14/2012   NA 137 09/14/2012   K 3.9 09/14/2012   CL 100 09/14/2012   CO2 27 09/14/2012   Lab Results  Component Value Date   ALT 17 09/14/2012   AST 16 09/14/2012   ALKPHOS 59 09/14/2012   BILITOT 0.7 09/14/2012     Assessment & Plan  Anxiety in acute stress reaction Dealing with persistent depression and anxiety due to his ongoing stressors at work. Due to his work uncertainty he struggles with stress about financial concerns and his ongoing pain which he cannot afford to manage well if he is unemployed. He hopes to keep his job with the [employer] but he struggles with increased pain if he has a long car ride to work. He is hoping they will accomodate his request for a position within an hour of his home so he can tolerate the pain  Pelvic floor dysfunction Continues to struggle with pain. Definitely does better when he is able to attend frequent PT treatments and worse when he cannot and has to drive long distances. Will try to do as much PT as he can afford.

## 2012-12-22 NOTE — Assessment & Plan Note (Signed)
Dealing with persistent depression and anxiety due to his ongoing stressors at work. Due to his work uncertainty he struggles with stress about financial concerns and his ongoing pain which he cannot afford to manage well if he is unemployed. He hopes to keep his job with the [employer] but he struggles with increased pain if he has a long car ride to work. He is hoping they will accomodate his request for a position within an hour of his home so he can tolerate the pain

## 2012-12-22 NOTE — Assessment & Plan Note (Signed)
Continues to struggle with pain. Definitely does better when he is able to attend frequent PT treatments and worse when he cannot and has to drive long distances. Will try to do as much PT as he can afford.

## 2012-12-23 ENCOUNTER — Telehealth: Payer: Self-pay

## 2012-12-23 NOTE — Telephone Encounter (Signed)
Message copied by Court Joy on Thu Dec 23, 2012  5:02 PM ------      Message from: Danise Edge A      Created: Wed Dec 22, 2012  6:54 PM       I think he needs something from me but I did not write down what. Is my note good enough? Or did he need a different letter. I know it has to be faxed to the [employer] fax # [phone]. Please check ------

## 2012-12-23 NOTE — Telephone Encounter (Signed)
Pt states that everything you too discussed in the appt needs to be in a note. Pt stated he cant remember everything that was needed.

## 2012-12-27 NOTE — Telephone Encounter (Signed)
Please print last 2 notes and any letters I have written in past so I can dictate his letter. Thanks

## 2012-12-27 NOTE — Telephone Encounter (Signed)
Letters printed and put in basket for md

## 2012-12-28 NOTE — Telephone Encounter (Signed)
Patient left another message stating that he would like MD to add that he can only work between 4-8 hours. That way its not wrote in stone that he should leave after 4 hours if he would like to stay.  Pt stated he would come into the office if need be for this to be done?  Please advise?

## 2012-12-28 NOTE — Telephone Encounter (Signed)
Pt left a message stating that he remembered now what he wanted MD to talk about in the letter. -closer to home -acute stress will flare up the problem

## 2012-12-29 ENCOUNTER — Encounter: Payer: Self-pay | Admitting: Family Medicine

## 2012-12-29 NOTE — Telephone Encounter (Signed)
Letter done

## 2012-12-30 ENCOUNTER — Telehealth: Payer: Self-pay

## 2012-12-30 NOTE — Telephone Encounter (Signed)
Pt informed that letter is being faxed. Faxed to [phone] attn: Loleta Rose

## 2012-12-30 NOTE — Telephone Encounter (Signed)
Message copied by Court Joy on Thu Dec 30, 2012 10:16 AM ------      Message from: Danise Edge A      Created: Wed Dec 29, 2012 11:23 PM       Letter is done ------

## 2013-01-03 ENCOUNTER — Ambulatory Visit (INDEPENDENT_AMBULATORY_CARE_PROVIDER_SITE_OTHER): Payer: Federal, State, Local not specified - PPO | Admitting: Family Medicine

## 2013-01-03 ENCOUNTER — Telehealth: Payer: Self-pay | Admitting: Family Medicine

## 2013-01-03 ENCOUNTER — Encounter: Payer: Self-pay | Admitting: Family Medicine

## 2013-01-03 VITALS — BP 110/72 | HR 94 | Temp 98.2°F | Ht 67.5 in | Wt 177.0 lb

## 2013-01-03 DIAGNOSIS — M6289 Other specified disorders of muscle: Secondary | ICD-10-CM

## 2013-01-03 DIAGNOSIS — F430 Acute stress reaction: Secondary | ICD-10-CM

## 2013-01-03 DIAGNOSIS — R030 Elevated blood-pressure reading, without diagnosis of hypertension: Secondary | ICD-10-CM

## 2013-01-03 DIAGNOSIS — R300 Dysuria: Secondary | ICD-10-CM

## 2013-01-03 DIAGNOSIS — N411 Chronic prostatitis: Secondary | ICD-10-CM

## 2013-01-03 MED ORDER — CIPROFLOXACIN HCL 500 MG PO TABS: 500.0000 mg | ORAL_TABLET | Freq: Two times a day (BID) | ORAL | Status: DC

## 2013-01-03 NOTE — Patient Instructions (Signed)

## 2013-01-03 NOTE — Telephone Encounter (Signed)
Patient would like that note that Dr. Abner Greenspan wrote to be faxed to the # that he gave [place].

## 2013-01-04 ENCOUNTER — Telehealth: Payer: Self-pay

## 2013-01-04 LAB — URINALYSIS
Bilirubin Urine: NEGATIVE
Ketones, ur: NEGATIVE mg/dL
Protein, ur: NEGATIVE mg/dL
Urobilinogen, UA: 1 mg/dL (ref 0.0–1.0)

## 2013-01-04 NOTE — Telephone Encounter (Signed)
Letter faxed.

## 2013-01-04 NOTE — Telephone Encounter (Signed)
Jared Myers w/ [employer] called stating she needed clarification on the first sentence of Dr Mariel Aloe letter dated 12-29-12?  I informed her that a new letter dated 01-03-13 was faxed this morning.

## 2013-01-06 NOTE — Progress Notes (Signed)
Quick Note:  Patient Informed and voiced understanding ______ 

## 2013-01-09 ENCOUNTER — Encounter: Payer: Self-pay | Admitting: Family Medicine

## 2013-01-09 NOTE — Progress Notes (Signed)
Patient ID: Jared Myers, male   DOB: 06-02-1980, 32 y.o.   MRN: [id number] Jared Myers [id number] [birth year] 01/09/2013      Progress Note-Follow Up  Subjective  Chief Complaint  Chief Complaint  Patient presents with  . Follow-up    2 week    HPI  Patient is a 32 year old Caucasian with persistent anxiety hip pain. He is reporting ongoing struggles with work regarding his work situation. He is unable to tolerate prolonged sitting and standing and unfortunately has to drive the distance toward frequently. As a result his increased abdominal and back pain. Increased pelvic floor dysfunction as well as in meds. Recently. He is pain near his prostate has increased as well. He does better when he is able to get regular physical therapy but has been unable to do to do to work and financial constraints. Acknowledging some dysuria as well. No fevers or chills. No chest pain palpitations and shortness of breath GI concerns noted  Past Medical History  Diagnosis Date  . Premature ventricular beat   . Pelvic floor dysfunction   . Acute upper respiratory infections of unspecified site 04/28/2012  . 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 Inguinal hernia 05/12/2012    History reviewed. No pertinent past surgical history.  History reviewed. No pertinent family history.  History   Social History  . Marital Status: Divorced    Spouse Name: N/A    Number of Children: N/A  . Years of Education: N/A   Occupational History  . Not on file.   Social History Main Topics  . Smoking status: Never Smoker   . Smokeless tobacco: Not on file  . Alcohol Use: No  . Drug Use: No  . Sexual Activity: No   Other Topics Concern  . Not on file   Social History Narrative  . No narrative on file    Current Outpatient Prescriptions on File Prior to Visit  Medication Sig Dispense Refill  . cyclobenzaprine (FLEXERIL) 10 MG tablet Take 1 tablet (10 mg total) by mouth 2 (two) times daily as  needed for muscle spasms.  40 tablet  1  . escitalopram (LEXAPRO) 10 MG tablet Add 10 mg tab in evening and continue 20 mg tab in am  30 tablet  1  . escitalopram (LEXAPRO) 20 MG tablet Take 1 tablet (20 mg total) by mouth daily.  30 tablet  3  . HYDROcodone-acetaminophen (NORCO) 5-325 MG per tablet Take 1 tablet by mouth every 6 (six) hours as needed for pain.  30 tablet  1   No current facility-administered medications on file prior to visit.    Allergies  Allergen Reactions  . Guaifenesin     REACTION: PVCs  . Prednisone     PVC    Review of Systems  Review of Systems  Constitutional: Negative for fever and malaise/fatigue.  HENT: Negative for congestion.   Eyes: Negative for discharge.  Respiratory: Negative for shortness of breath.   Cardiovascular: Negative for chest pain, palpitations and leg swelling.  Gastrointestinal: Positive for abdominal pain. Negative for nausea and diarrhea.  Genitourinary: Negative for dysuria.  Musculoskeletal: Positive for back pain. Negative for falls.  Skin: Negative for rash.  Neurological: Negative for loss of consciousness and headaches.  Endo/Heme/Allergies: Negative for polydipsia.  Psychiatric/Behavioral: Positive for depression. Negative for suicidal ideas. The patient is nervous/anxious. The patient does not have insomnia.     Objective  BP 110/72  Pulse 94  Temp(Src) 98.2 F (36.8  C) (Oral)  Ht 5' 7.5" (1.715 m)  Wt 177 lb (80.287 kg)  BMI 27.30 kg/m2  SpO2 97%  Physical Exam  Physical Exam  Constitutional: He is oriented to person, place, and time and well-developed, well-nourished, and in no distress. No distress.  HENT:  Head: Normocephalic and atraumatic.  Eyes: Conjunctivae are normal.  Neck: Neck supple. No thyromegaly present.  Cardiovascular: Normal rate, regular rhythm and normal heart sounds.   No murmur heard. Pulmonary/Chest: Effort normal and breath sounds normal. No respiratory distress.  Abdominal: He  exhibits no distension and no mass. There is no tenderness.  Musculoskeletal: He exhibits no edema.  Neurological: He is alert and oriented to person, place, and time.  Skin: Skin is warm.  Psychiatric: Memory, affect and judgment normal.    Lab Results  Component Value Date   TSH 0.450 09/14/2012   Lab Results  Component Value Date   WBC 8.6 09/14/2012   HGB 15.7 09/14/2012   HCT 43.2 09/14/2012   MCV 83.7 09/14/2012   PLT 288 09/14/2012   Lab Results  Component Value Date   CREATININE 0.98 09/14/2012   BUN 13 09/14/2012   NA 137 09/14/2012   K 3.9 09/14/2012   CL 100 09/14/2012   CO2 27 09/14/2012   Lab Results  Component Value Date   ALT 17 09/14/2012   AST 16 09/14/2012   ALKPHOS 59 09/14/2012   BILITOT 0.7 09/14/2012     Assessment & Plan  PROSTATITIS, CHRONIC Patient c/o increased pain in testicular region with ejaculation and blood in semen recently, urinalysis negative, startedon Ciprofloxacin and probiotics, will need referral back to urology if no improvement, has agreed to continue to work with Dr Juanda Crumble at [facility]  Pelvic floor dysfunction Symptoms improve when he is able to have regular physical therapy and avoid prolonged sitting or standing, encouraged to proceed. Is trying to have his job help him with accomodations but this is proving difficult  Stress reaction Responding somewhat to SSRI but work stress continues to be an issue  ELEVATED BLOOD PRESSURE WITHOUT DIAGNOSIS OF HYPERTENSION Well controlled today

## 2013-01-09 NOTE — Assessment & Plan Note (Addendum)
Patient c/o increased pain in testicular region with ejaculation and blood in semen recently, urinalysis negative, startedon Ciprofloxacin and probiotics, will need referral back to urology if no improvement, has agreed to continue to work with Dr Juanda Crumble at [facility]

## 2013-01-09 NOTE — Assessment & Plan Note (Signed)
Well controlled today.

## 2013-01-09 NOTE — Assessment & Plan Note (Signed)
Responding somewhat to SSRI but work stress continues to be an issue

## 2013-01-09 NOTE — Assessment & Plan Note (Signed)
Symptoms improve when he is able to have regular physical therapy and avoid prolonged sitting or standing, encouraged to proceed. Is trying to have his job help him with accomodations but this is proving difficult

## 2013-01-14 ENCOUNTER — Telehealth: Payer: Self-pay

## 2013-01-14 NOTE — Telephone Encounter (Signed)
Opened in error

## 2013-01-24 ENCOUNTER — Encounter: Payer: Self-pay | Admitting: Family Medicine

## 2013-01-24 ENCOUNTER — Ambulatory Visit (INDEPENDENT_AMBULATORY_CARE_PROVIDER_SITE_OTHER): Payer: Federal, State, Local not specified - PPO | Admitting: Family Medicine

## 2013-01-24 VITALS — BP 112/72 | HR 82 | Temp 97.6°F | Ht 67.5 in | Wt 178.1 lb

## 2013-01-24 DIAGNOSIS — M62838 Other muscle spasm: Secondary | ICD-10-CM

## 2013-01-24 DIAGNOSIS — M6289 Other specified disorders of muscle: Secondary | ICD-10-CM

## 2013-01-24 DIAGNOSIS — N411 Chronic prostatitis: Secondary | ICD-10-CM

## 2013-01-24 DIAGNOSIS — R030 Elevated blood-pressure reading, without diagnosis of hypertension: Secondary | ICD-10-CM

## 2013-01-24 MED ORDER — METHOCARBAMOL 500 MG PO TABS: 500.0000 mg | ORAL_TABLET | Freq: Three times a day (TID) | ORAL | Status: DC | PRN

## 2013-01-24 NOTE — Patient Instructions (Signed)
°  Muscle Cramps and Spasms °Muscle cramps and spasms occur when a muscle or muscles tighten and you have no control over this tightening (involuntary muscle contraction). They are a common problem and can develop in any muscle. The most common place is in the calf muscles of the leg. Both muscle cramps and muscle spasms are involuntary muscle contractions, but they also have differences:  °· Muscle cramps are sporadic and painful. They may last a few seconds to a quarter of an hour. Muscle cramps are often more forceful and last longer than muscle spasms. °· Muscle spasms may or may not be painful. They may also last just a few seconds or much longer. °CAUSES  °It is uncommon for cramps or spasms to be due to a serious underlying problem. In many cases, the cause of cramps or spasms is unknown. Some common causes are:  °· Overexertion.   °· Overuse from repetitive motions (doing the same thing over and over).   °· Remaining in a certain position for a long period of time.   °· Improper preparation, form, or technique while performing a sport or activity.   °· Dehydration.   °· Injury.   °· Side effects of some medicines.   °· Abnormally low levels of the salts and ions in your blood (electrolytes), especially potassium and calcium. This could happen if you are taking water pills (diuretics) or you are pregnant.   °Some underlying medical problems can make it more likely to develop cramps or spasms. These include, but are not limited to:  °· Diabetes.   °· Parkinson disease.   °· Hormone disorders, such as thyroid problems.   °· Alcohol abuse.   °· Diseases specific to muscles, joints, and bones.   °· Blood vessel disease where not enough blood is getting to the muscles.   °HOME CARE INSTRUCTIONS  °· Stay well hydrated. Drink enough water and fluids to keep your urine clear or pale yellow. °· It may be helpful to massage, stretch, and relax the affected muscle. °· For tight or tense muscles, use a warm towel, heating  pad, or hot shower water directed to the affected area. °· If you are sore or have pain after a cramp or spasm, applying ice to the affected area may relieve discomfort. °· Put ice in a plastic bag. °· Place a towel between your skin and the bag. °· Leave the ice on for 15-20 minutes, 03-04 times a day. °· Medicines used to treat a known cause of cramps or spasms may help reduce their frequency or severity. Only take over-the-counter or prescription medicines as directed by your caregiver. °SEEK MEDICAL CARE IF:  °Your cramps or spasms get more severe, more frequent, or do not improve over time.  °MAKE SURE YOU:  °· Understand these instructions. °· Will watch your condition. °· Will get help right away if you are not doing well or get worse. °Document Released: 08/16/2001 Document Revised: 06/21/2012 Document Reviewed: 02/11/2012 °Exit[employer]® Patient Information ©2014 [employer]. ° ° °

## 2013-01-24 NOTE — Progress Notes (Signed)
Pre visit review using our clinic review tool, if applicable. No additional management support is needed unless otherwise documented below in the visit note. 

## 2013-01-24 NOTE — Progress Notes (Signed)
Patient ID: Jared Myers, male   DOB: June 15, 1980, 32 y.o.   MRN: [id number] ADONNIS SALCEDA [id number] 01/12/1981 01/24/2013      Progress Note-Follow Up  Subjective  Chief Complaint  Chief Complaint  Patient presents with  . Follow-up    3 week     HPI  Patient is a 51-year-old male in today with persistent pelvic pain although it is improved status post a course of Cipro no dysuria, hematuria, frequency or urgency at this time. No other acute complaints. No GI concerns. No fevers or chills. Continues to have ongoing stress and anxiety at work but nothing new. No chest pain, palpitations or shortness of breath  Past Medical History  Diagnosis Date  . Premature ventricular beat   . Pelvic floor dysfunction   . Acute upper respiratory infections of unspecified site 04/28/2012  . 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 Inguinal hernia 05/12/2012    History reviewed. No pertinent past surgical history.  No family history on file.  History   Social History  . Marital Status: Divorced    Spouse Name: N/A    Number of Children: N/A  . Years of Education: N/A   Occupational History  . Not on file.   Social History Main Topics  . Smoking status: Never Smoker   . Smokeless tobacco: Not on file  . Alcohol Use: No  . Drug Use: No  . Sexual Activity: No   Other Topics Concern  . Not on file   Social History Narrative  . No narrative on file    Current Outpatient Prescriptions on File Prior to Visit  Medication Sig Dispense Refill  . cyclobenzaprine (FLEXERIL) 10 MG tablet Take 1 tablet (10 mg total) by mouth 2 (two) times daily as needed for muscle spasms.  40 tablet  1  . escitalopram (LEXAPRO) 10 MG tablet Add 10 mg tab in evening and continue 20 mg tab in am  30 tablet  1  . escitalopram (LEXAPRO) 20 MG tablet Take 1 tablet (20 mg total) by mouth daily.  30 tablet  3  . HYDROcodone-acetaminophen (NORCO) 5-325 MG per tablet Take 1 tablet by mouth every 6 (six)  hours as needed for pain.  30 tablet  1  . OVER THE COUNTER MEDICATION Bark Cherry Syrup-       No current facility-administered medications on file prior to visit.    Allergies  Allergen Reactions  . Guaifenesin     REACTION: PVCs  . Prednisone     PVC    Review of Systems  Review of Systems  Constitutional: Negative for fever and malaise/fatigue.  HENT: Negative for congestion.   Eyes: Negative for discharge.  Respiratory: Negative for shortness of breath.   Cardiovascular: Negative for chest pain, palpitations and leg swelling.  Gastrointestinal: Negative for nausea, abdominal pain and diarrhea.  Genitourinary: Negative for dysuria.  Musculoskeletal: Negative for falls.  Skin: Negative for rash.  Neurological: Negative for loss of consciousness and headaches.  Endo/Heme/Allergies: Negative for polydipsia.  Psychiatric/Behavioral: Negative for depression and suicidal ideas. The patient is not nervous/anxious and does not have insomnia.     Objective  BP 112/72  Pulse 82  Temp(Src) 97.6 F (36.4 C) (Oral)  Ht 5' 7.5" (1.715 m)  Wt 178 lb 1.9 oz (80.795 kg)  BMI 27.47 kg/m2  SpO2 96%  Physical Exam  Physical Exam  Constitutional: He is oriented to person, place, and time and well-developed, well-nourished, and in no distress.  No distress.  HENT:  Head: Normocephalic and atraumatic.  Eyes: Conjunctivae are normal.  Neck: Neck supple. No thyromegaly present.  Cardiovascular: Normal rate, regular rhythm and normal heart sounds.   No murmur heard. Pulmonary/Chest: Effort normal and breath sounds normal. No respiratory distress.  Abdominal: He exhibits no distension and no mass. There is no tenderness.  Musculoskeletal: He exhibits no edema.  Neurological: He is alert and oriented to person, place, and time.  Skin: Skin is warm.  Psychiatric: Memory, affect and judgment normal.    Lab Results  Component Value Date   TSH 0.450 09/14/2012   Lab Results   Component Value Date   WBC 8.6 09/14/2012   HGB 15.7 09/14/2012   HCT 43.2 09/14/2012   MCV 83.7 09/14/2012   PLT 288 09/14/2012   Lab Results  Component Value Date   CREATININE 0.98 09/14/2012   BUN 13 09/14/2012   NA 137 09/14/2012   K 3.9 09/14/2012   CL 100 09/14/2012   CO2 27 09/14/2012   Lab Results  Component Value Date   ALT 17 09/14/2012   AST 16 09/14/2012   ALKPHOS 59 09/14/2012   BILITOT 0.7 09/14/2012       Assessment & Plan  ELEVATED BLOOD PRESSURE WITHOUT DIAGNOSIS OF HYPERTENSION Well controlled, no changes.  PROSTATITIS, CHRONIC Recent course of Cipro helped with pain, no urinary symptoms at present. Pelvic persists but slightly improved  Pelvic floor dysfunction Pain improved with PT.

## 2013-01-28 ENCOUNTER — Telehealth: Payer: Self-pay | Admitting: Family Medicine

## 2013-01-28 NOTE — Telephone Encounter (Signed)
Patient states that he is coming down with a cold. He states that he is having chills,drainage, and a headache. He would like to know if it would be okay to take Nyquil with the medicine that he is on?

## 2013-01-28 NOTE — Telephone Encounter (Signed)
Patient informed Ok to take Nyquil, but be aware of possible drowsiness effect & pt needs to schedule appt if symptoms do not resolve with OTC medications and/or new sxs present; pt understood & agreed/SLS

## 2013-01-29 NOTE — Assessment & Plan Note (Signed)
Pain improved with PT.

## 2013-01-29 NOTE — Assessment & Plan Note (Signed)
Recent course of Cipro helped with pain, no urinary symptoms at present. Pelvic persists but slightly improved

## 2013-01-29 NOTE — Assessment & Plan Note (Signed)
Well controlled, no changes 

## 2013-02-22 ENCOUNTER — Ambulatory Visit: Payer: Federal, State, Local not specified - PPO | Admitting: Family Medicine

## 2013-03-04 ENCOUNTER — Ambulatory Visit: Payer: Federal, State, Local not specified - PPO | Admitting: Family Medicine

## 2013-03-25 ENCOUNTER — Ambulatory Visit: Payer: Federal, State, Local not specified - PPO | Admitting: Family Medicine

## 2013-04-26 ENCOUNTER — Ambulatory Visit: Payer: Federal, State, Local not specified - PPO | Admitting: Family Medicine

## 2013-05-17 ENCOUNTER — Ambulatory Visit: Payer: Federal, State, Local not specified - PPO | Admitting: Family Medicine

## 2013-05-17 ENCOUNTER — Encounter: Payer: Self-pay | Admitting: Family Medicine

## 2013-05-17 ENCOUNTER — Ambulatory Visit (INDEPENDENT_AMBULATORY_CARE_PROVIDER_SITE_OTHER): Payer: Federal, State, Local not specified - PPO | Admitting: Family Medicine

## 2013-05-17 VITALS — BP 130/82 | HR 92 | Temp 98.4°F | Ht 67.5 in | Wt 180.1 lb

## 2013-05-17 DIAGNOSIS — R030 Elevated blood-pressure reading, without diagnosis of hypertension: Secondary | ICD-10-CM

## 2013-05-17 DIAGNOSIS — F430 Acute stress reaction: Secondary | ICD-10-CM

## 2013-05-17 DIAGNOSIS — J329 Chronic sinusitis, unspecified: Secondary | ICD-10-CM

## 2013-05-17 DIAGNOSIS — M6289 Other specified disorders of muscle: Secondary | ICD-10-CM

## 2013-05-17 MED ORDER — DOXYCYCLINE HYCLATE 100 MG PO TABS: 100.0000 mg | ORAL_TABLET | Freq: Two times a day (BID) | ORAL | Status: DC

## 2013-05-17 NOTE — Progress Notes (Signed)
Patient ID: Jared Myers, male   DOB: 23-Oct-1980, 33 y.o.   MRN: [id number] Jared Myers [id number] 23-Oct-1980 05/17/2013      Progress Note-Follow Up  Subjective  Chief Complaint  Chief Complaint  Patient presents with  . Follow-up  . Sinusitis    yellow phlegm X sat and sun (fever)    HPI  Patient is a 33 year old male in today for routine medical care. Has been struggling with increased upper respiratory symptoms this week with temp to max of 102, malaise, myalgias, fatigue, sore throat, head and chest congestion noted productive of yellow phlegm. Is working but still has increased pelvic pain with longer hours and increased physical activity, lifting etc. Denies CP/palp/SOB/HA//GI or GU c/o. Taking meds as prescribed   Past Medical History  Diagnosis Date  . Premature ventricular beat   . Pelvic floor dysfunction   . Acute upper respiratory infections of unspecified site 04/28/2012  . 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 Inguinal hernia 05/12/2012    History reviewed. No pertinent past surgical history.  History reviewed. No pertinent family history.  History   Social History  . Marital Status: Divorced    Spouse Name: N/A    Number of Children: N/A  . Years of Education: N/A   Occupational History  . Not on file.   Social History Main Topics  . Smoking status: Never Smoker   . Smokeless tobacco: Not on file  . Alcohol Use: No  . Drug Use: No  . Sexual Activity: No   Other Topics Concern  . Not on file   Social History Narrative  . No narrative on file    Current Outpatient Prescriptions on File Prior to Visit  Medication Sig Dispense Refill  . escitalopram (LEXAPRO) 10 MG tablet Add 10 mg tab in evening and continue 20 mg tab in am  30 tablet  1  . escitalopram (LEXAPRO) 20 MG tablet Take 1 tablet (20 mg total) by mouth daily.  30 tablet  3  . HYDROcodone-acetaminophen (NORCO) 5-325 MG per tablet Take 1 tablet by mouth every 6 (six) hours  as needed for pain.  30 tablet  1  . methocarbamol (ROBAXIN) 500 MG tablet Take 1 tablet (500 mg total) by mouth every 8 (eight) hours as needed for muscle spasms.  60 tablet  1   No current facility-administered medications on file prior to visit.    Allergies  Allergen Reactions  . Guaifenesin     REACTION: PVCs  . Prednisone     PVC    Review of Systems  Review of Systems  Constitutional: Positive for fever and chills. Negative for malaise/fatigue.  HENT: Positive for congestion.   Eyes: Negative for discharge.  Respiratory: Positive for cough and sputum production. Negative for shortness of breath.   Cardiovascular: Negative for chest pain, palpitations and leg swelling.  Gastrointestinal: Positive for nausea. Negative for abdominal pain and diarrhea.  Genitourinary: Negative for dysuria.  Musculoskeletal: Negative for falls.  Skin: Negative for rash.  Neurological: Positive for headaches. Negative for loss of consciousness.  Endo/Heme/Allergies: Negative for polydipsia.  Psychiatric/Behavioral: Negative for depression and suicidal ideas. The patient is not nervous/anxious and does not have insomnia.     Objective  BP 130/82  Pulse 92  Temp(Src) 98.4 F (36.9 C) (Oral)  Ht 5' 7.5" (1.715 m)  Wt 180 lb 1.9 oz (81.702 kg)  BMI 27.78 kg/m2  SpO2 97%  Physical Exam  Physical Exam  Constitutional: He is oriented to person, place, and time and well-developed, well-nourished, and in no distress. No distress.  HENT:  Head: Normocephalic and atraumatic.  Eyes: Conjunctivae are normal.  Neck: Neck supple. No thyromegaly present.  Cardiovascular: Normal rate, regular rhythm and normal heart sounds.   No murmur heard. Pulmonary/Chest: Effort normal and breath sounds normal. No respiratory distress.  Abdominal: He exhibits no distension and no mass. There is no tenderness.  Musculoskeletal: He exhibits no edema.  Neurological: He is alert and oriented to person, place,  and time.  Skin: Skin is warm.  Psychiatric: Memory, affect and judgment normal.    Lab Results  Component Value Date   TSH 0.450 09/14/2012   Lab Results  Component Value Date   WBC 8.6 09/14/2012   HGB 15.7 09/14/2012   HCT 43.2 09/14/2012   MCV 83.7 09/14/2012   PLT 288 09/14/2012   Lab Results  Component Value Date   CREATININE 0.98 09/14/2012   BUN 13 09/14/2012   NA 137 09/14/2012   K 3.9 09/14/2012   CL 100 09/14/2012   CO2 27 09/14/2012   Lab Results  Component Value Date   ALT 17 09/14/2012   AST 16 09/14/2012   ALKPHOS 59 09/14/2012   BILITOT 0.7 09/14/2012     Assessment & Plan  Sinusitis Started on Doxycycline and Mucinex, encouraged probiotics and increased rest and hydration.   Stress reaction Continues to struggle with increased stress at work as they try and work out their differences about his work schedule secondary to his restrictions. Feels the Escitalopram has been helpful. Continue same  ELEVATED BLOOD PRESSURE WITHOUT DIAGNOSIS OF HYPERTENSION Well controlled, no new meds. Encouraged heart healthy diet such as the DASH diet and exercise as tolerated.   Pelvic floor dysfunction Continue PT, will need to maintain work restrictions as he improves. Needs to maintain limitations to lifting, bending, carrying etc as previously documented. Can work 4-8 hours at a time as tolerated

## 2013-05-17 NOTE — Patient Instructions (Signed)

## 2013-05-17 NOTE — Progress Notes (Signed)
Pre visit review using our clinic review tool, if applicable. No additional management support is needed unless otherwise documented below in the visit note. 

## 2013-05-19 ENCOUNTER — Ambulatory Visit: Payer: Federal, State, Local not specified - PPO | Admitting: Family Medicine

## 2013-05-22 DIAGNOSIS — J329 Chronic sinusitis, unspecified: Secondary | ICD-10-CM | POA: Insufficient documentation

## 2013-05-22 NOTE — Assessment & Plan Note (Signed)
Continues to struggle with increased stress at work as they try and work out their differences about his work schedule secondary to his restrictions. Feels the Escitalopram has been helpful. Continue same

## 2013-05-22 NOTE — Assessment & Plan Note (Signed)
Started on Doxycycline and Mucinex, encouraged probiotics and increased rest and hydration.

## 2013-05-22 NOTE — Assessment & Plan Note (Signed)
Continue PT, will need to maintain work restrictions as he improves. Needs to maintain limitations to lifting, bending, carrying etc as previously documented. Can work 4-8 hours at a time as tolerated

## 2013-05-22 NOTE — Assessment & Plan Note (Signed)
Well controlled, no new meds. Encouraged heart healthy diet such as the DASH diet and exercise as tolerated.

## 2013-05-27 ENCOUNTER — Telehealth: Payer: Self-pay

## 2013-05-27 NOTE — Telephone Encounter (Signed)
Patient left a message stating that he would like an update of when the letter will be done that MD said she would write? Pt stated he is on his way to work so a message can be left on his vm so he can update his manager about the letter.   Please advise?

## 2013-05-27 NOTE — Telephone Encounter (Signed)
Printed and put on MD's desk. Left a detailed message stating that MD was taking this home to work on

## 2013-05-27 NOTE — Telephone Encounter (Signed)
Please print his last note and last letter and I will write another letter

## 2013-05-30 ENCOUNTER — Telehealth: Payer: Self-pay

## 2013-05-30 ENCOUNTER — Encounter: Payer: Self-pay | Admitting: Family Medicine

## 2013-05-30 NOTE — Telephone Encounter (Signed)
Letter for patients work has been mailed to patient

## 2013-06-17 ENCOUNTER — Encounter: Payer: Self-pay | Admitting: Physician Assistant

## 2013-06-17 ENCOUNTER — Ambulatory Visit (INDEPENDENT_AMBULATORY_CARE_PROVIDER_SITE_OTHER): Payer: Federal, State, Local not specified - PPO | Admitting: Physician Assistant

## 2013-06-17 VITALS — BP 120/86 | HR 64 | Temp 98.0°F | Resp 16 | Ht 67.5 in | Wt 179.5 lb

## 2013-06-17 DIAGNOSIS — M6289 Other specified disorders of muscle: Secondary | ICD-10-CM

## 2013-06-17 DIAGNOSIS — R110 Nausea: Secondary | ICD-10-CM

## 2013-06-17 DIAGNOSIS — R109 Unspecified abdominal pain: Secondary | ICD-10-CM

## 2013-06-17 MED ORDER — CYCLOBENZAPRINE HCL 10 MG PO TABS: 10.0000 mg | ORAL_TABLET | Freq: Every day | ORAL | Status: DC

## 2013-06-17 MED ORDER — ONDANSETRON 8 MG PO TBDP: 8.0000 mg | ORAL_TABLET | Freq: Three times a day (TID) | ORAL | Status: DC | PRN

## 2013-06-17 MED ORDER — HYDROCODONE-ACETAMINOPHEN 10-325 MG PO TABS: 1.0000 | ORAL_TABLET | Freq: Three times a day (TID) | ORAL | Status: DC | PRN

## 2013-06-17 NOTE — Patient Instructions (Signed)
Please take medication as directed.  Take Flexeril at bedtime.  Can try 1/2 tablet during the day as long as you are not driving.  Increase fluid intake.  Take a fiber supplement.  Take zofran if needed for nausea.  Follow-up with Dr. Abner GreenspanBlyth as scheduled.

## 2013-06-17 NOTE — Assessment & Plan Note (Signed)
Rx Norco 10.  STOP Robaxin.  Rx Flexeril.  Continue with PT.  Increase fluid intake.  Fiber supplement.

## 2013-06-17 NOTE — Progress Notes (Signed)
Pre visit review using our clinic review tool, if applicable. No additional management support is needed unless otherwise documented below in the visit note/SLS  

## 2013-06-17 NOTE — Progress Notes (Signed)
Patient presents to clinic today c/o worsening pain associated with his pelvic floor dysfunction.  Patient has long-standing history of this condition.  Patient is prescribed Norco and Robaxin but has not taken medication.  Patient did have one episode of emesis due to significant pain this am.  No recurrence.  Denies change in bowel or bladder habitus.  Patient is not following up with PT at present due to finances.  Past Medical History  Diagnosis Date  . Premature ventricular beat   . Pelvic floor dysfunction   . Acute upper respiratory infections of unspecified site 04/28/2012  . 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 Inguinal hernia 05/12/2012    Current Outpatient Prescriptions on File Prior to Visit  Medication Sig Dispense Refill  . escitalopram (LEXAPRO) 20 MG tablet Take 1 tablet (20 mg total) by mouth daily.  30 tablet  3   No current facility-administered medications on file prior to visit.    Allergies  Allergen Reactions  . Guaifenesin     REACTION: PVCs  . Prednisone     PVC    No family history on file.  History   Social History  . Marital Status: Divorced    Spouse Name: N/A    Number of Children: N/A  . Years of Education: N/A   Social History Main Topics  . Smoking status: Never Smoker   . Smokeless tobacco: None  . Alcohol Use: No  . Drug Use: No  . Sexual Activity: No   Other Topics Concern  . None   Social History Narrative  . None   Review of Systems - See HPI.  All other ROS are negative.  BP 120/86  Pulse 64  Temp(Src) 98 F (36.7 C) (Oral)  Resp 16  Ht 5' 7.5" (1.715 m)  Wt 179 lb 8 oz (81.421 kg)  BMI 27.68 kg/m2  SpO2 98%  Physical Exam  Vitals reviewed. Constitutional: He is oriented to person, place, and time and well-developed, well-nourished, and in no distress.  HENT:  Head: Normocephalic and atraumatic.  Eyes: Conjunctivae are normal. Pupils are equal, round, and reactive to light.  Neck: Neck supple.   Cardiovascular: Normal rate, regular rhythm and normal heart sounds.   Pulmonary/Chest: Breath sounds normal. No respiratory distress. He has no wheezes. He has no rales. He exhibits no tenderness.  Abdominal: Soft. Bowel sounds are normal. He exhibits no distension and no mass. There is no tenderness. There is no rebound and no guarding.  Neurological: He is alert and oriented to person, place, and time.  Skin: Skin is dry.   Assessment/Plan: Pelvic floor dysfunction Rx Norco 10.  STOP Robaxin.  Rx Flexeril.  Continue with PT.  Increase fluid intake.  Fiber supplement.

## 2013-06-20 ENCOUNTER — Telehealth: Payer: Self-pay

## 2013-06-20 NOTE — Telephone Encounter (Signed)
FYI:  Pt called and stated that he saw [facility] on Friday and since he "upped" his hydrocodone dose it seems to really be helping. It doesn't take the pain away completely but he is able to work 6 hours instead of 4 hours. Pt also stated that he had his hearing tomorrow morning.

## 2013-06-22 ENCOUNTER — Telehealth: Payer: Self-pay | Admitting: *Deleted

## 2013-06-22 NOTE — Telephone Encounter (Signed)
Pt left message that after speaking with his attorney regarding updated work restrictions that you completed in letter from 05/30/13, he would like an addition to the restrictions that he should maintain a daytime shift as he is concerned they may now try to change him to night time shift.  Pt states pain meds are helping; still has pain but it is manageable. States he can discuss this further at his upcoming appt on 06/28/13.

## 2013-06-22 NOTE — Telephone Encounter (Signed)
OK to rewrite note adding a sentence stating he needs daytime hours due to increased pain while working nights. Otherwise the same

## 2013-06-24 NOTE — Telephone Encounter (Signed)
Letter is at the front desk. I tried to call and inform pt but it states vm is not set up

## 2013-06-28 ENCOUNTER — Encounter: Payer: Self-pay | Admitting: Family Medicine

## 2013-06-28 ENCOUNTER — Ambulatory Visit (INDEPENDENT_AMBULATORY_CARE_PROVIDER_SITE_OTHER): Payer: Federal, State, Local not specified - PPO | Admitting: Family Medicine

## 2013-06-28 VITALS — BP 108/70 | HR 87 | Temp 98.2°F | Ht 67.5 in | Wt 178.0 lb

## 2013-06-28 DIAGNOSIS — R030 Elevated blood-pressure reading, without diagnosis of hypertension: Secondary | ICD-10-CM

## 2013-06-28 DIAGNOSIS — F411 Generalized anxiety disorder: Secondary | ICD-10-CM

## 2013-06-28 DIAGNOSIS — M6289 Other specified disorders of muscle: Secondary | ICD-10-CM

## 2013-06-28 DIAGNOSIS — R4589 Other symptoms and signs involving emotional state: Secondary | ICD-10-CM

## 2013-06-28 DIAGNOSIS — F430 Acute stress reaction: Secondary | ICD-10-CM

## 2013-06-28 DIAGNOSIS — R109 Unspecified abdominal pain: Secondary | ICD-10-CM

## 2013-06-28 MED ORDER — CYCLOBENZAPRINE HCL 10 MG PO TABS: 10.0000 mg | ORAL_TABLET | Freq: Every day | ORAL | Status: DC

## 2013-06-28 MED ORDER — HYDROCODONE-ACETAMINOPHEN 10-325 MG PO TABS: 1.0000 | ORAL_TABLET | Freq: Three times a day (TID) | ORAL | Status: DC | PRN

## 2013-06-28 NOTE — Patient Instructions (Signed)
Myalgia, Adult °Myalgia is the medical term for muscle pain. It is a symptom of many things. Nearly everyone at some time in their life has this. The most common cause for muscle pain is overuse or straining and more so when you are not in shape. Injuries and muscle bruises cause myalgias. Muscle pain without a history of injury can also be caused by a virus. It frequently comes along with the flu. Myalgia not caused by muscle strain can be present in a large number of infectious diseases. Some autoimmune diseases like lupus and fibromyalgia can cause muscle pain. Myalgia may be mild, or severe. °SYMPTOMS  °The symptoms of myalgia are simply muscle pain. Most of the time this is short lived and the pain goes away without treatment. °DIAGNOSIS  °Myalgia is diagnosed by your caregiver by taking your history. This means you tell him when the problems began, what they are, and what has been happening. If this has not been a long term problem, your caregiver may want to watch for a while to see what will happen. If it has been long term, they may want to do additional testing. °TREATMENT  °The treatment depends on what the underlying cause of the muscle pain is. Often anti-inflammatory medications will help. °HOME CARE INSTRUCTIONS °· If the pain in your muscles came from overuse, slow down your activities until the problems go away. °· Myalgia from overuse of a muscle can be treated with alternating hot and cold packs on the muscle affected or with cold for the first couple days. If either heat or cold seems to make things worse, stop their use. °· Apply ice to the sore area for 15-20 minutes, 03-04 times per day, while awake for the first 2 days of muscle soreness, or as directed. Put the ice in a plastic bag and place a towel between the bag of ice and your skin. °· Only take over-the-counter or prescription medicines for pain, discomfort, or fever as directed by your caregiver. °· Regular gentle exercise may help if  you are not active. °· Stretching before strenuous exercise can help lower the risk of myalgia. It is normal when beginning an exercise regimen to feel some muscle pain after exercising. Muscles that have not been used frequently will be sore at first. If the pain is extreme, this may mean injury to a muscle. °SEEK MEDICAL CARE IF: °· You have an increase in muscle pain that is not relieved with medication. °· You begin to run a temperature. °· You develop nausea and vomiting. °· You develop a stiff and painful neck. °· You develop a rash. °· You develop muscle pain after a tick bite. °· You have continued muscle pain while working out even after you are in good condition. °SEEK IMMEDIATE MEDICAL CARE IF: °Any of your problems are getting worse and medications are not helping. °MAKE SURE YOU:  °· Understand these instructions. °· Will watch your condition. °· Will get help right away if you are not doing well or get worse. °Document Released: 01/16/2006 Document Revised: 05/19/2011 Document Reviewed: 04/07/2006 °[employer]® Patient Information ©2014 [employer]. ° °

## 2013-06-28 NOTE — Progress Notes (Signed)
Pre visit review using our clinic review tool, if applicable. No additional management support is needed unless otherwise documented below in the visit note. 

## 2013-07-03 ENCOUNTER — Encounter: Payer: Self-pay | Admitting: Family Medicine

## 2013-07-03 NOTE — Assessment & Plan Note (Signed)
Has improved recently and is doing well without meds at this time.

## 2013-07-03 NOTE — Assessment & Plan Note (Addendum)
Pain is more manageable with Norco, may continue the same. Benefiting from PT and work restrictions.

## 2013-07-03 NOTE — Assessment & Plan Note (Signed)
Well controlled. Encouraged heart healthy diet such as the DASH diet and exercise as tolerated.  

## 2013-07-03 NOTE — Progress Notes (Signed)
Patient ID: Jared FlakeJoshua A Corning, male   DOB: [birth year], 33 y.o.   MRN: [id number] Jared FlakeJoshua A Leija [id number] [birth year] 07/03/2013      Progress Note-Follow Up  Subjective  Chief Complaint  Chief Complaint  Patient presents with  . Follow-up    6 week    HPI  Patient is a 33 year old male in today for routine medical care. Continues to struggle with pelvic pain but notes with Norco and limited work schedule he is doing better. His pain is manageable he works during the day and not for too many hours. He is unable to maintain one position for too long a time without increased pain so he is unable to drive any distance. No heavy lifting or repetitive bending as tolerated. Otherwise no acute complaints. His anxiety and depression still exist but are improved. Denies CP/palp/SOB/HA/congestion/fevers/GI or GU c/o. Taking meds as prescribed  Past Medical History  Diagnosis Date  . Premature ventricular beat   . Pelvic floor dysfunction   . Acute upper respiratory infections of unspecified site 04/28/2012  . 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 Inguinal hernia 05/12/2012    History reviewed. No pertinent past surgical history.  History reviewed. No pertinent family history.  History   Social History  . Marital Status: Divorced    Spouse Name: N/A    Number of Children: N/A  . Years of Education: N/A   Occupational History  . Not on file.   Social History Main Topics  . Smoking status: Never Smoker   . Smokeless tobacco: Not on file  . Alcohol Use: No  . Drug Use: No  . Sexual Activity: No   Other Topics Concern  . Not on file   Social History Narrative  . No narrative on file    No current outpatient prescriptions on file prior to visit.   No current facility-administered medications on file prior to visit.    Allergies  Allergen Reactions  . Guaifenesin     REACTION: PVCs  . Prednisone     PVC    Review of Systems  Review of Systems  Constitutional:  Negative for fever and malaise/fatigue.  HENT: Negative for congestion.   Eyes: Negative for discharge.  Respiratory: Negative for shortness of breath.   Cardiovascular: Negative for chest pain, palpitations and leg swelling.  Gastrointestinal: Positive for abdominal pain. Negative for nausea and diarrhea.  Genitourinary: Negative for dysuria.  Musculoskeletal: Negative for falls.  Skin: Negative for rash.  Neurological: Negative for loss of consciousness and headaches.  Endo/Heme/Allergies: Negative for polydipsia.  Psychiatric/Behavioral: Negative for depression and suicidal ideas. The patient is nervous/anxious. The patient does not have insomnia.     Objective  BP 108/70  Pulse 87  Temp(Src) 98.2 F (36.8 C) (Oral)  Ht 5' 7.5" (1.715 m)  Wt 178 lb (80.74 kg)  BMI 27.45 kg/m2  SpO2 97%  Physical Exam  Physical Exam  Constitutional: He is oriented to person, place, and time and well-developed, well-nourished, and in no distress. No distress.  HENT:  Head: Normocephalic and atraumatic.  Eyes: Conjunctivae are normal.  Neck: Neck supple. No thyromegaly present.  Cardiovascular: Normal rate, regular rhythm and normal heart sounds.   No murmur heard. Pulmonary/Chest: Effort normal and breath sounds normal. No respiratory distress.  Abdominal: He exhibits no distension and no mass. There is no tenderness.  Musculoskeletal: He exhibits no edema.  Neurological: He is alert and oriented to person, place, and time.  Skin: Skin  is warm.  Psychiatric: Memory, affect and judgment normal.    Lab Results  Component Value Date   TSH 0.450 09/14/2012   Lab Results  Component Value Date   WBC 8.6 09/14/2012   HGB 15.7 09/14/2012   HCT 43.2 09/14/2012   MCV 83.7 09/14/2012   PLT 288 09/14/2012   Lab Results  Component Value Date   CREATININE 0.98 09/14/2012   BUN 13 09/14/2012   NA 137 09/14/2012   K 3.9 09/14/2012   CL 100 09/14/2012   CO2 27 09/14/2012   Lab Results  Component Value Date    ALT 17 09/14/2012   AST 16 09/14/2012   ALKPHOS 59 09/14/2012   BILITOT 0.7 09/14/2012    Assessment & Plan  ELEVATED BLOOD PRESSURE WITHOUT DIAGNOSIS OF HYPERTENSION Well controlled Encouraged heart healthy diet such as the DASH diet and exercise as tolerated.   Pelvic floor dysfunction Pain is more manageable with Norco, may continue the same. Benefiting from PT and work restrictions.  Anxiety in acute stress reaction Has improved recently and is doing well without meds at this time.

## 2013-07-04 ENCOUNTER — Telehealth: Payer: Self-pay

## 2013-07-04 ENCOUNTER — Telehealth: Payer: Self-pay | Admitting: Family Medicine

## 2013-07-04 NOTE — Telephone Encounter (Signed)
Patient called in stating that he has been running a temp of 105 off/on since last Friday. He says that he has also been having back/neck stiffness. I asked the nurse about this and she states that patient should go to the ED. I informed patient of this and he states that he would go to the ED.

## 2013-07-04 NOTE — Telephone Encounter (Signed)
FYI  Temp 100.5 to 99.1, temp will go to 96.5 after tylenol. Cant tell if pain is the pelvic floor? Lower back pain and stiff neck.    Verbal per md pt needs to stay well hydrated, take probiotics. If pain gets worse or other symptoms appear (like headache, diarrhea etc...) give us a call back or go to urgent care of ER  Patient informed and voiced understanding

## 2013-07-06 NOTE — Telephone Encounter (Signed)
Patient called in and states that his temp is still running around 100 and is having abd pain. [facility] but he did not want to drive all the way from [place]. Patient states that he will go to an Urgent Care down in [place].

## 2013-07-06 NOTE — Telephone Encounter (Signed)
Patient called in stating that he went to an Urgent Care in [place] and was diagnosed with mono and uti. He is also going to be having an US of his spleen and appendix tomorrow.

## 2013-07-08 ENCOUNTER — Telehealth: Payer: Self-pay

## 2013-07-08 NOTE — Telephone Encounter (Signed)
Pt left a vm stating that he was diagnosed with mono and his wifes granmothers 75th birthday party is this weekend and he wanted to know if he should go or not?  Please advise?

## 2013-07-08 NOTE — Telephone Encounter (Signed)
I agree he will be much less infectious in a week

## 2013-07-08 NOTE — Telephone Encounter (Signed)
Probably not. It is hard to know exactly how long he will be contagious but Mono takes a while to recover from

## 2013-07-08 NOTE — Telephone Encounter (Signed)
Pt informed and voiced understanding. Pt stated the urgent care called back and stated that his spleen was enlarged and they took him out of work until Next Friday. Pt stated he wanted to see if md would let him go back to work on Monday because they have some sit down jobs. I informed pt that he really needed to take this time to rest and get better and that MD would most likely agree.  Pt stated he will call back later to schedule a follow up appt.

## 2013-07-15 ENCOUNTER — Telehealth: Payer: Self-pay | Admitting: Family Medicine

## 2013-07-15 ENCOUNTER — Ambulatory Visit: Payer: Federal, State, Local not specified - PPO | Admitting: Family Medicine

## 2013-07-15 DIAGNOSIS — Z0289 Encounter for other administrative examinations: Secondary | ICD-10-CM

## 2013-07-15 NOTE — Telephone Encounter (Signed)
Patient returned phone call. He states that he did go on into work. He did scheduled appointment for 08/02/13 with Dr. Abner GreenspanBlyth.

## 2013-07-15 NOTE — Telephone Encounter (Signed)
Patient would like to know if Dr. Abner GreenspanBlyth would write him out of work from today to ?[employer]. Patient states that he feels fine enough to work but everyone around him says that he doesn't need to because of his spleen. He states that is the reason for todays visit.

## 2013-07-15 NOTE — Telephone Encounter (Signed)
Please advise 

## 2013-07-15 NOTE — Telephone Encounter (Signed)
Per Neysa Bonitohristy and Dr. Abner GreenspanBlyth. Patient would need to be seen before she writes him a note. He can either schedule appointment with us(I had offered patient two appointment dates/times and he declined both) or he can go to the Urgent Care that he went to in [place]. I left message for patient to return my call.

## 2013-07-18 ENCOUNTER — Encounter: Payer: Self-pay | Admitting: Family Medicine

## 2013-07-18 ENCOUNTER — Ambulatory Visit (INDEPENDENT_AMBULATORY_CARE_PROVIDER_SITE_OTHER): Payer: Federal, State, Local not specified - PPO | Admitting: Family Medicine

## 2013-07-18 VITALS — BP 100/60 | HR 88 | Temp 98.3°F | Ht 67.5 in | Wt 177.1 lb

## 2013-07-18 DIAGNOSIS — N390 Urinary tract infection, site not specified: Secondary | ICD-10-CM

## 2013-07-18 DIAGNOSIS — B2790 Infectious mononucleosis, unspecified without complication: Secondary | ICD-10-CM

## 2013-07-18 DIAGNOSIS — R030 Elevated blood-pressure reading, without diagnosis of hypertension: Secondary | ICD-10-CM

## 2013-07-18 DIAGNOSIS — R161 Splenomegaly, not elsewhere classified: Secondary | ICD-10-CM

## 2013-07-18 HISTORY — DX: Infectious mononucleosis, unspecified without complication: B27.90

## 2013-07-18 HISTORY — DX: Splenomegaly, not elsewhere classified: R16.1

## 2013-07-18 LAB — POCT URINALYSIS DIPSTICK
Blood, UA: NEGATIVE
GLUCOSE UA: NEGATIVE
KETONES UA: NEGATIVE
LEUKOCYTES UA: NEGATIVE
Nitrite, UA: NEGATIVE
PH UA: 7
Spec Grav, UA: 1.02
Urobilinogen, UA: 0.2

## 2013-07-18 MED ORDER — CEFDINIR 300 MG PO CAPS: 300.0000 mg | ORAL_CAPSULE | Freq: Two times a day (BID) | ORAL | Status: AC

## 2013-07-18 NOTE — Progress Notes (Signed)
Pre visit review using our clinic review tool, if applicable. No additional management support is needed unless otherwise documented below in the visit note. 

## 2013-07-18 NOTE — Patient Instructions (Signed)
Infectious Mononucleosis  Infectious mononucleosis (mono) is a common germ (viral) infection in children, teenagers, and young adults.   CAUSES   Mono is an infection caused by the Epstein Barr virus. The virus is spread by close personal contact with someone who has the infection. It can be passed by contact with your saliva through things such as kissing or sharing drinking glasses. Sometimes, the infection can be spread from someone who does not appear sick but still spreads the virus (asymptomatic carrier state).   SYMPTOMS   The most common symptoms of Mono are:   Sore throat.   Headache.   Fatigue.   Muscle aches.   Swollen glands.   Fever.   Poor appetite.   Enlarged liver or spleen.  The less common symptoms can include:   Rash.   Feeling sick to your stomach (nauseous).   Abdominal pain.  DIAGNOSIS   Mono is diagnosed by a blood test.   TREATMENT   Treatment of mono is usually at home. There is no medicine that cures this virus. Sometimes hospital treatment is needed in severe cases. Steroid medicine sometimes is needed if the swelling in the throat causes breathing or swallowing problems.   HOME CARE INSTRUCTIONS    Drink enough fluids to keep your urine clear or pale yellow.   Eat soft foods. Cool foods like popsicles or ice cream can soothe a sore throat.   Only take over-the-counter or prescription medicines for pain, discomfort, or fever as directed by your caregiver. Children under 18 years of age should not take aspirin.   Gargle salt water. This may help relieve your sore throat. Put 1 teaspoon (tsp) of salt in 1 cup of warm water. Sucking on hard candy may also help.   Rest as needed.   Start regular activities gradually after the fever is gone. Be sure to rest when tired.   Avoid strenuous exercise or contact sports until your caregiver says it is okay. The liver and spleen could be seriously injured.   Avoid sharing drinking glasses or kissing until your caregiver tells you  that you are no longer contagious.  SEEK MEDICAL CARE IF:    Your fever is not gone after 7 days.   Your activity level is not back to normal after 2 weeks.   You have yellow coloring to eyes and skin (jaundice).  SEEK IMMEDIATE MEDICAL CARE IF:    You have severe pain in the abdomen or shoulder.   You have trouble swallowing or drooling.   You have trouble breathing.   You develop a stiff neck.   You develop a severe headache.   You cannot stop throwing up (vomiting).   You have convulsions.   You are confused.   You have trouble with balance.   You develop signs of body fluid loss (dehydration):   Weakness.   Sunken eyes.   Pale skin.   Dry mouth.   Rapid breathing or pulse.  MAKE SURE YOU:    Understand these instructions.   Will watch your condition.   Will get help right away if you are not doing well or get worse.  Document Released: 02/22/2000 Document Revised: 05/19/2011 Document Reviewed: 12/21/2007  ExitCare Patient Information 2014 [employer].

## 2013-07-20 LAB — CULTURE, URINE COMPREHENSIVE
COLONY COUNT: NO GROWTH
ORGANISM ID, BACTERIA: NO GROWTH

## 2013-07-23 ENCOUNTER — Encounter: Payer: Self-pay | Admitting: Family Medicine

## 2013-07-23 NOTE — Progress Notes (Signed)
Patient ID: Jared Myers, male   DOB: October 25, 1980, 33 y.o.   MRN: [id number] Jared Myers [id number] October 25, 1980 07/23/2013      Progress Note-Follow Up  Subjective  Chief Complaint  Chief Complaint  Patient presents with  . Sore Throat    since finding out he had mono  . Urinary Tract Infection    pt states urgent care put him on an antibiotic (sulfa?) but doesn't feel like its helping    HPI  Patient is a 33 year old male in today for routine medical care. He was recently seen in urgent care and diagnosed with mononucleosis. Testing was positive and he was noted to have some splenomegaly. He was having increased and his abdomen most notably in the left lower quadrant. It is slowly improving but his sore throat is worsening. No fevers or chills. No headache. Persistent fatigue. Denies CP/palp/SOB/HA/congestion/fevers/GI or GU c/o. Taking meds as prescribed  Past Medical History  Diagnosis Date  . Premature ventricular beat   . Pelvic floor dysfunction   . Acute upper respiratory infections of unspecified site 04/28/2012  . 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 Inguinal hernia 05/12/2012  . Splenomegaly 07/18/2013  . Infectious mononucleosis 07/18/2013    History reviewed. No pertinent past surgical history.  History reviewed. No pertinent family history.  History   Social History  . Marital Status: Divorced    Spouse Name: N/A    Number of Children: N/A  . Years of Education: N/A   Occupational History  . Not on file.   Social History Main Topics  . Smoking status: Never Smoker   . Smokeless tobacco: Not on file  . Alcohol Use: No  . Drug Use: No  . Sexual Activity: No   Other Topics Concern  . Not on file   Social History Narrative  . No narrative on file    Current Outpatient Prescriptions on File Prior to Visit  Medication Sig Dispense Refill  . cyclobenzaprine (FLEXERIL) 10 MG tablet Take 1 tablet (10 mg total) by mouth at bedtime.  30 tablet   3  . HYDROcodone-acetaminophen (NORCO) 10-325 MG per tablet Take 1 tablet by mouth every 8 (eight) hours as needed.  30 tablet  0   No current facility-administered medications on file prior to visit.    Allergies  Allergen Reactions  . Guaifenesin     REACTION: PVCs  . Prednisone     PVC    Review of Systems  Review of Systems  Constitutional: Positive for malaise/fatigue. Negative for fever.  HENT: Positive for sore throat. Negative for congestion.   Eyes: Negative for discharge.  Respiratory: Negative for shortness of breath.   Cardiovascular: Negative for chest pain, palpitations, orthopnea and leg swelling.  Gastrointestinal: Positive for abdominal pain. Negative for nausea and diarrhea.  Genitourinary: Negative for dysuria.  Musculoskeletal: Negative for falls.  Skin: Negative for rash.  Neurological: Negative for loss of consciousness and headaches.  Endo/Heme/Allergies: Negative for polydipsia.  Psychiatric/Behavioral: Negative for depression and suicidal ideas. The patient is not nervous/anxious and does not have insomnia.     Objective  BP 100/60  Pulse 88  Temp(Src) 98.3 F (36.8 C) (Oral)  Ht 5' 7.5" (1.715 m)  Wt 177 lb 1.3 oz (80.323 kg)  BMI 27.31 kg/m2  SpO2 98%  Physical Exam  Physical Exam  Constitutional: He is oriented to person, place, and time and well-developed, well-nourished, and in no distress. No distress.  HENT:  Head: Normocephalic  and atraumatic.  Eyes: Conjunctivae are normal.  Neck: Neck supple. No thyromegaly present.  Cardiovascular: Normal rate, regular rhythm and normal heart sounds.   No murmur heard. Pulmonary/Chest: Effort normal. No respiratory distress. He has no rales.  Abdominal: Soft. Bowel sounds are normal. He exhibits no distension and no mass. There is no tenderness. There is no rebound and no guarding.  Spleen  Enlarged roughly 1 finger breadth below costal margin. nontender  Genitourinary: Penis normal.   Musculoskeletal: He exhibits no edema.  Neurological: He is alert and oriented to person, place, and time.  Skin: Skin is warm.  Psychiatric: Memory, affect and judgment normal.    Lab Results  Component Value Date   TSH 0.450 09/14/2012   Lab Results  Component Value Date   WBC 8.6 09/14/2012   HGB 15.7 09/14/2012   HCT 43.2 09/14/2012   MCV 83.7 09/14/2012   PLT 288 09/14/2012   Lab Results  Component Value Date   CREATININE 0.98 09/14/2012   BUN 13 09/14/2012   NA 137 09/14/2012   K 3.9 09/14/2012   CL 100 09/14/2012   CO2 27 09/14/2012   Lab Results  Component Value Date   ALT 17 09/14/2012   AST 16 09/14/2012   ALKPHOS 59 09/14/2012   BILITOT 0.7 09/14/2012     Assessment & Plan  Infectious mononucleosis Still struggling with fatigue and sore throat as well as LLQ abdominal pain. Splenomegaly is mild roughly 1 Finger Breadth below costal margin. No heavy lifting or contact sports. Increase rest and hydration  ELEVATED BLOOD PRESSURE WITHOUT DIAGNOSIS OF HYPERTENSION Well controlled. Encouraged heart healthy diet such as the DASH diet and exercise as tolerated.

## 2013-07-23 NOTE — Assessment & Plan Note (Signed)
Well controlled. Encouraged heart healthy diet such as the DASH diet and exercise as tolerated.  

## 2013-07-23 NOTE — Assessment & Plan Note (Signed)
Still struggling with fatigue and sore throat as well as LLQ abdominal pain. Splenomegaly is mild roughly 1 Finger Breadth below costal margin. No heavy lifting or contact sports. Increase rest and hydration

## 2013-07-26 ENCOUNTER — Ambulatory Visit: Payer: Federal, State, Local not specified - PPO | Admitting: Family Medicine

## 2013-08-02 ENCOUNTER — Ambulatory Visit: Payer: Federal, State, Local not specified - PPO | Admitting: Family Medicine

## 2013-08-09 ENCOUNTER — Ambulatory Visit (INDEPENDENT_AMBULATORY_CARE_PROVIDER_SITE_OTHER): Payer: Federal, State, Local not specified - PPO | Admitting: Family Medicine

## 2013-08-09 ENCOUNTER — Encounter: Payer: Self-pay | Admitting: Family Medicine

## 2013-08-09 VITALS — BP 122/82 | HR 84 | Temp 98.1°F | Ht 67.5 in | Wt 182.0 lb

## 2013-08-09 DIAGNOSIS — M6289 Other specified disorders of muscle: Secondary | ICD-10-CM

## 2013-08-09 DIAGNOSIS — R259 Unspecified abnormal involuntary movements: Secondary | ICD-10-CM

## 2013-08-09 DIAGNOSIS — R251 Tremor, unspecified: Secondary | ICD-10-CM

## 2013-08-09 DIAGNOSIS — R030 Elevated blood-pressure reading, without diagnosis of hypertension: Secondary | ICD-10-CM

## 2013-08-09 DIAGNOSIS — R161 Splenomegaly, not elsewhere classified: Secondary | ICD-10-CM

## 2013-08-09 DIAGNOSIS — R109 Unspecified abdominal pain: Secondary | ICD-10-CM

## 2013-08-09 MED ORDER — HYDROCODONE-ACETAMINOPHEN 10-325 MG PO TABS: 1.0000 | ORAL_TABLET | Freq: Three times a day (TID) | ORAL | Status: DC | PRN

## 2013-08-09 NOTE — Progress Notes (Signed)
Pre visit review using our clinic review tool, if applicable. No additional management support is needed unless otherwise documented below in the visit note. 

## 2013-08-09 NOTE — Patient Instructions (Signed)
Infectious Mononucleosis  Infectious mononucleosis (mono) is a common viral infection. You can get mono from close contact with someone who is infected. If you have mono, you may have a sore throat, headache, feel tired, or have a fever.  HOME CARE   Drink enough fluids to keep your pee (urine) clear or pale yellow.   Eat soft foods. Eat cold foods (frozen ice pops, ice cream) to make your throat feel better.   Only take medicines as told by your doctor. Do not give aspirin to children.   Rest as needed. Have someone with you to make sure you do not get worse.   Do not  play contact sports. Avoid activities where you could get hurt for 3 to 4 weeks. The organ that cleans your blood (spleen) might be puffy (swollen), and it could get hurt.   Wash your hands or use hand sanitizer often. Avoid kissing or sharing your drinking glass until your doctor says you are better.   Keep all follow-up visits as told by your doctor.  GET HELP RIGHT AWAY IF:    You have strong pain in your stomach or shoulder.   You have trouble breathing or swallowing.   You are confused.   You get a strong headache or stiff neck.   You are shaking (convulsing).   You keep throwing up (vomiting).   You are weak, with pale skin, dry mouth, and fast heartbeat.   Your fever does not go away after 7 days.   You cannot return to normal activities after 2 weeks.   You have yellow color in the eyes and skin (jaundice).  MAKE SURE YOU:    Understand these instructions.   Will watch your condition.   Will get help right away if you are not doing well or get worse.  Document Released: 02/12/2009 Document Revised: 05/19/2011 Document Reviewed: 02/12/2009  [employer] Patient Information 2014 [employer].

## 2013-08-09 NOTE — Assessment & Plan Note (Signed)
Still mild LUQ discomfort but improving slowly. Tolerating 1/2 days of work

## 2013-08-09 NOTE — Assessment & Plan Note (Signed)
Well controlled, no changes to meds. Encouraged heart healthy diet such as the DASH diet and exercise as tolerated.  °

## 2013-08-09 NOTE — Progress Notes (Signed)
Patient ID: Jared Myers, male   DOB: [birth year], 33 y.o.   MRN: [id number] Jared Myers [id number] 24-Aug-1980 08/09/2013      Progress Note-Follow Up  Subjective  Chief Complaint  Chief Complaint  Patient presents with  . Follow-up    6 week    HPI  Patient is a 33 year old male in today for routine medical care. Patient is in today for followup. No eczematous at it and said that day. He can occur especially has eaten and he is working hard. His anxiety is better as his work situation is improved. His pelvic floor pain is improving although does still occur if he overexerts himself. No new or acute complaints noted today. No recent illness. Denies CP/palp/SOB/HA/congestion/fevers/GI or GU c/o. Taking meds as prescribed  Past Medical History  Diagnosis Date  . Premature ventricular beat   . Pelvic floor dysfunction   . Acute upper respiratory infections of unspecified site 04/28/2012  . 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 Inguinal hernia 05/12/2012  . Splenomegaly 07/18/2013  . Infectious mononucleosis 07/18/2013    History reviewed. No pertinent past surgical history.  History reviewed. No pertinent family history.  History   Social History  . Marital Status: Divorced    Spouse Name: N/A    Number of Children: N/A  . Years of Education: N/A   Occupational History  . Not on file.   Social History Main Topics  . Smoking status: Never Smoker   . Smokeless tobacco: Not on file  . Alcohol Use: No  . Drug Use: No  . Sexual Activity: No   Other Topics Concern  . Not on file   Social History Narrative  . No narrative on file    Current Outpatient Prescriptions on File Prior to Visit  Medication Sig Dispense Refill  . cyclobenzaprine (FLEXERIL) 10 MG tablet Take 1 tablet (10 mg total) by mouth at bedtime.  30 tablet  3  . HYDROcodone-acetaminophen (NORCO) 10-325 MG per tablet Take 1 tablet by mouth every 8 (eight) hours as needed.  30 tablet  0   No  current facility-administered medications on file prior to visit.    Allergies  Allergen Reactions  . Guaifenesin     REACTION: PVCs  . Prednisone     PVC    Review of Systems  Review of Systems  Constitutional: Negative for fever and malaise/fatigue.  HENT: Negative for congestion.   Eyes: Negative for discharge.  Respiratory: Negative for shortness of breath.   Cardiovascular: Negative for chest pain, palpitations and leg swelling.  Gastrointestinal: Positive for abdominal pain. Negative for nausea and diarrhea.  Genitourinary: Negative for dysuria.  Musculoskeletal: Negative for falls.  Skin: Negative for rash.  Neurological: Negative for loss of consciousness and headaches.  Endo/Heme/Allergies: Negative for polydipsia.  Psychiatric/Behavioral: Negative for depression and suicidal ideas. The patient is not nervous/anxious and does not have insomnia.     Objective  BP 122/82  Pulse 84  Temp(Src) 98.1 F (36.7 C) (Oral)  Ht 5' 7.5" (1.715 m)  Wt 182 lb (82.555 kg)  BMI 28.07 kg/m2  SpO2 96%  Physical Exam  Physical Exam  Constitutional: He is oriented to person, place, and time and well-developed, well-nourished, and in no distress. No distress.  HENT:  Head: Normocephalic and atraumatic.  Eyes: Conjunctivae are normal.  Neck: Neck supple. No thyromegaly present.  Cardiovascular: Normal rate, regular rhythm and normal heart sounds.   No murmur heard. Pulmonary/Chest: Effort normal  and breath sounds normal. No respiratory distress.  Abdominal: He exhibits no distension and no mass. There is no tenderness.  Musculoskeletal: He exhibits no edema.  Neurological: He is alert and oriented to person, place, and time.  Skin: Skin is warm.  Psychiatric: Memory, affect and judgment normal.    Lab Results  Component Value Date   TSH 0.450 09/14/2012   Lab Results  Component Value Date   WBC 8.6 09/14/2012   HGB 15.7 09/14/2012   HCT 43.2 09/14/2012   MCV 83.7 09/14/2012    PLT 288 09/14/2012   Lab Results  Component Value Date   CREATININE 0.98 09/14/2012   BUN 13 09/14/2012   NA 137 09/14/2012   K 3.9 09/14/2012   CL 100 09/14/2012   CO2 27 09/14/2012   Lab Results  Component Value Date   ALT 17 09/14/2012   AST 16 09/14/2012   ALKPHOS 59 09/14/2012   BILITOT 0.7 09/14/2012    Assessment & Plan  ELEVATED BLOOD PRESSURE WITHOUT DIAGNOSIS OF HYPERTENSION Well controlled, no changes to meds. Encouraged heart healthy diet such as the DASH diet and exercise as tolerated.   Splenomegaly Still mild LUQ discomfort but improving slowly. Tolerating 1/2 days of work  [place]-In[place]ness Happens after working hard and not eating. Likely hypoglycemia, encouraged small, frequent meals with lean proteins and complex carbs  Pelvic floor dysfunction Uses Cyclobenzaprine prn when pain is worsened

## 2013-08-14 ENCOUNTER — Encounter: Payer: Self-pay | Admitting: Family Medicine

## 2013-08-14 DIAGNOSIS — R251 Tremor, unspecified: Secondary | ICD-10-CM

## 2013-08-14 HISTORY — DX: Tremor, unspecified: R25.1

## 2013-08-14 NOTE — Assessment & Plan Note (Signed)
Happens after working hard and not eating. Likely hypoglycemia, encouraged small, frequent meals with lean proteins and complex carbs

## 2013-08-14 NOTE — Assessment & Plan Note (Signed)
Uses Cyclobenzaprine prn when pain is worsened

## 2013-08-23 ENCOUNTER — Telehealth: Payer: Self-pay

## 2013-08-23 NOTE — Telephone Encounter (Signed)
Pt left a message stating that he is out of flexeril. Last RX was wrote on 06-28-13 quantity 30 with 3 refills.  I left a detailed message stating that there should still be refills on this and to contact his pharmacy

## 2013-09-27 ENCOUNTER — Telehealth: Payer: Self-pay | Admitting: Family Medicine

## 2013-09-27 NOTE — Telephone Encounter (Signed)
Left detailed message on voicemail and to call if any questions. 

## 2013-09-27 NOTE — Telephone Encounter (Signed)
Patient states that he [place] [employer] and wanted to make sure it was ok to ride the rides? He states that Dr. Abner GreenspanBlyth told him back in May that he has an enlarged speen. He states that it is okay to leave a detailed message.

## 2013-09-27 NOTE — Telephone Encounter (Signed)
As long as his left sided abdominal tenderness has resolved it should be ok to go on rides. If he is still having left sided tenderness I would recommend avoiding rides.

## 2013-10-31 ENCOUNTER — Encounter: Payer: Self-pay | Admitting: Family Medicine

## 2013-10-31 ENCOUNTER — Ambulatory Visit (INDEPENDENT_AMBULATORY_CARE_PROVIDER_SITE_OTHER): Payer: Federal, State, Local not specified - PPO | Admitting: Family Medicine

## 2013-10-31 ENCOUNTER — Telehealth: Payer: Self-pay | Admitting: Family Medicine

## 2013-10-31 VITALS — BP 126/88 | HR 65 | Temp 98.5°F | Resp 16 | Ht 67.5 in | Wt 181.0 lb

## 2013-10-31 DIAGNOSIS — N410 Acute prostatitis: Secondary | ICD-10-CM

## 2013-10-31 DIAGNOSIS — N453 Epididymo-orchitis: Secondary | ICD-10-CM

## 2013-10-31 DIAGNOSIS — N451 Epididymitis: Secondary | ICD-10-CM

## 2013-10-31 MED ORDER — LEVOFLOXACIN 750 MG PO TABS: ORAL_TABLET | ORAL | Status: DC

## 2013-10-31 NOTE — Progress Notes (Signed)
Pre visit review using our clinic review tool, if applicable. No additional management support is needed unless otherwise documented below in the visit note. 

## 2013-10-31 NOTE — Telephone Encounter (Signed)
Patient Information:  Caller Name: Voris  Phone: [phone]  Patient: Jared Myers, Jared Myers  Gender: Male  DOB: 1980/03/28  Age: 33 Years  PCP: Danise [facility])  Office Follow Up:  Does the office need to follow up with this patient?: No  Instructions For The Office: N/A  RN Note:  Took Hydrocodone at [phone].  Pain rated 8/10 at 0515..  Voided this morning.  Last BM 10/31/12; constipated. No appointments remain at [facility] office.  Scheduled for 1000 10/31/13 at [facility] with Dr. Marvel Plan.  Symptoms  Reason For Call & Symptoms: Severe pelvic floor discomfort with sharp stabbing pain in left groin and stiff back.  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  Fluids:  Sip clear fluids only (e.g., water, flat soft drinks or half-strength fruit juice) until the pain has been gone for over 2 hours. Then slowly return to a regular diet.  Pass A BM:  Sit on the toilet and try to pass a bowel movement (BM). Do not strain. This may relieve pain if it is due to constipation or impending diarrhea.  Call Back If:  Abdominal pain is constant and present for more than 2 hours  You become worse.  RN Overrode Recommendation:  Make Appointment  Scheduled appointment within 4 hours per MD protocol for ED disposition when office is open.  Appointment Scheduled:  10/31/2013 10:00:00 Appointment Scheduled Provider:  Other

## 2013-10-31 NOTE — Progress Notes (Signed)
OFFICE NOTE  10/31/2013  CC:  Chief Complaint  Patient presents with  . Groin Pain  . Back Pain    left sided    HPI: Patient is a 33 y.o. Caucasian male who is here for here for 4d hx of worsening severe left side/flank pain, rad to left mid back. No fever.  Has had similar pains before attributed to his pelvic floor dysfunction but never this severe.   Worse with deep breath. Sharp pain in tip of penis this morning.  Has been having pain in left testicle region last few days as well. Nausea today but no vomiting.  Intermittent dysuria but no frequency or urgency. Took hydrocodone about 2 hours ago but was sent home from work due to pain.  Pertinent PMH:  Past Medical History  Diagnosis Date  . Premature ventricular beat   . Pelvic floor dysfunction   . Acute upper respiratory infections of unspecified site 04/28/2012  . 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 Inguinal hernia 05/12/2012  . Splenomegaly 07/18/2013  . Infectious mononucleosis 07/18/2013  . Tremulousness 08/14/2013   No past surgical history on file.  MEDS:  Outpatient Prescriptions Prior to Visit  Medication Sig Dispense Refill  . HYDROcodone-acetaminophen (NORCO) 10-325 MG per tablet Take 1 tablet by mouth every 8 (eight) hours as needed.  30 tablet  0  . cyclobenzaprine (FLEXERIL) 10 MG tablet Take 1 tablet (10 mg total) by mouth at bedtime.  30 tablet  3   No facility-administered medications prior to visit.    PE: Blood pressure 126/88, pulse 65, temperature 98.5 F (36.9 C), temperature source Temporal, resp. rate 16, height 5' 7.5" (1.715 m), weight 181 lb (82.101 kg), SpO2 98.00%. Gen: Alert, well appearing.  Patient is oriented to person, place, time, and situation. AFFECT: pleasant, lucid thought and speech. ZOX:WRUE: no injection, icteris, swelling, or exudate.  EOMI, PERRLA. Mouth: lips without lesion/swelling.  Oral mucosa pink and moist. Oropharynx without erythema, exudate, or swelling.   CV: RRR, no m/r/g.   LUNGS: CTA bilat, nonlabored resps, good aeration in all lung fields. No CVA tenderness. TTP in left side/flank--mild as per his chronic per pt report. Left LQ and groin/suprapubic TTP w/out guarding or rebound.  No mass.  Decreased BS. GU: general soft tissue suprapubic discomfort to palpation, left inguinal canal TTP, left epididymal area very TTP.  No mass. Minimal testicular tenderness on remainder of testicle exam. Rectal exam: negative without mass, lesions or tenderness, PROSTATE EXAM: tenderness noted diffusely, without fluctuance or nodularity.  LAB: none  IMPRESSION AND PLAN:  Acute epididymitis and prostatitis. Levaquin  qd x 14d. Continue current meds for pain control.  An After Visit Summary was printed and given to the patient.  FOLLOW UP: prn

## 2013-10-31 NOTE — Telephone Encounter (Signed)
FYI

## 2013-11-01 ENCOUNTER — Telehealth: Payer: Self-pay

## 2013-11-01 NOTE — Telephone Encounter (Signed)
Pt states he went to [facility] office and was treated for Prostitis? Pt states the Levaquin made him very sick and he missed work today also? Pt states he got a note for yesterday but needs one for today (11-01-13). Pt wasn't sure if he should call [facility] office or Dr Abner Greenspan could write this?  Please advise?

## 2013-11-02 NOTE — Telephone Encounter (Signed)
Either can write it, we can write it on 8/27 to cover the 2 days if he does not already have it.

## 2013-11-10 NOTE — Telephone Encounter (Signed)
I spoke to patient and wrote work note for him  Pt will come by later to pick up

## 2013-11-12 IMAGING — CT CT ABD-PELV W/ CM
2 of 4 series · 17 of 46 positions shown, 19 images · IV contrast (omnipaque)
Comparison: Abdominal pelvic CT 07/22/2010 and 04/22/2005.

CLINICAL DATA: Left lower quadrant abdominal pain with nausea for
months.  History of inguinal hernia, pelvic floor dysfunction and
pyloric stenosis.

CT ABDOMEN AND PELVIS WITH CONTRAST
TECHNIQUE: Multidetector CT imaging of the abdomen and pelvis was
performed following the standard protocol during bolus
administration of intravenous contrast.
Contrast: 100mL OMNIPAQUE IOHEXOL 300 MG/ML  SOLN

[Series 2: abd/pelvis 5.0 b31f · axial · 0.76mm/px · z∈[-409,+11]mm · 14 of 92 slices shown, 16 images]
[im 4/92  soft-tissue]
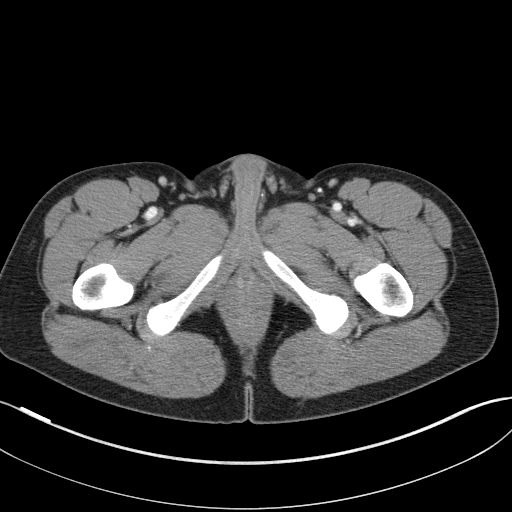
[im 4/92  bone]
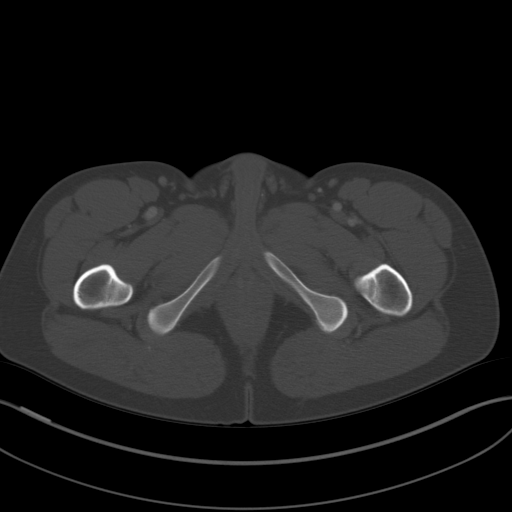
[im 11/92  soft-tissue]
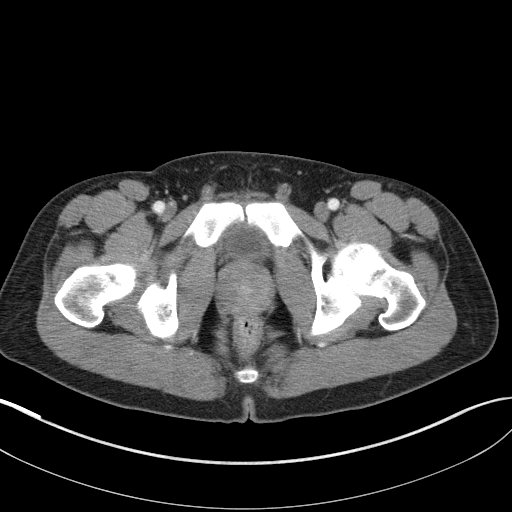
[im 19/92  soft-tissue]
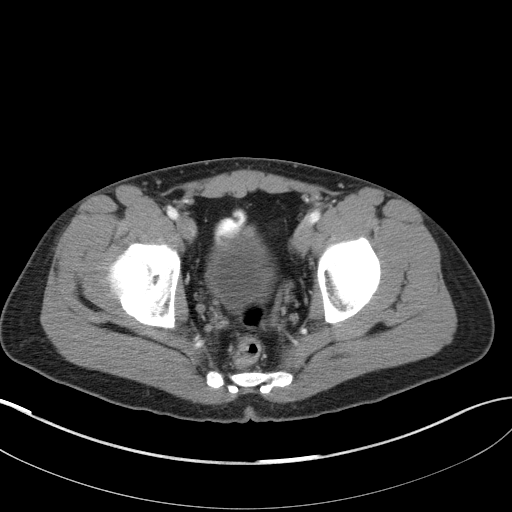
[im 26/92  soft-tissue]
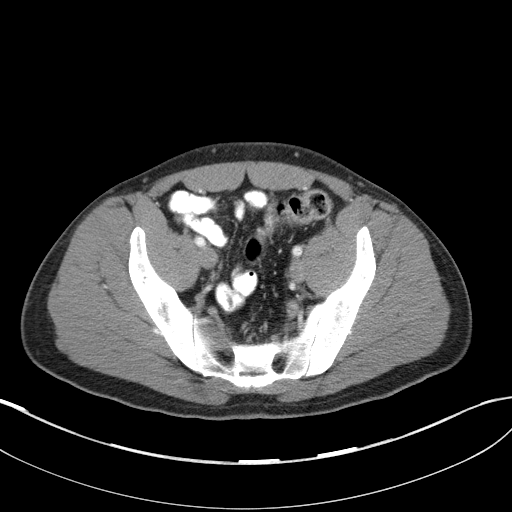
[im 30/92  soft-tissue]
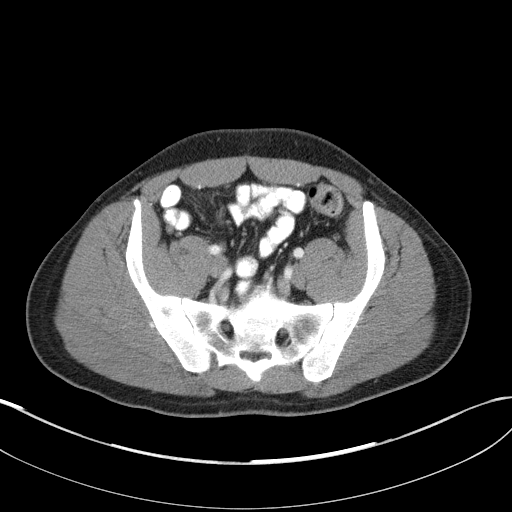
[im 37/92  soft-tissue]
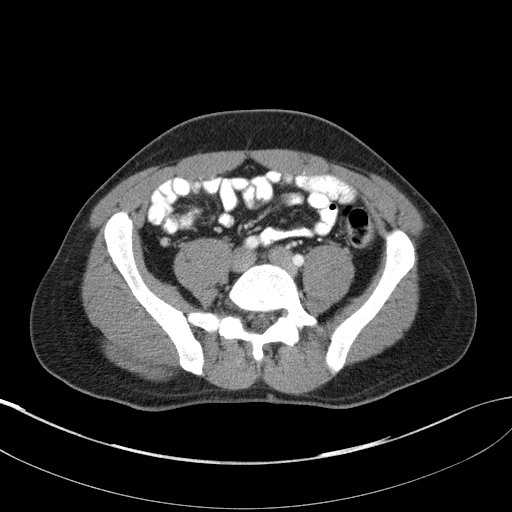
[im 44/92  soft-tissue]
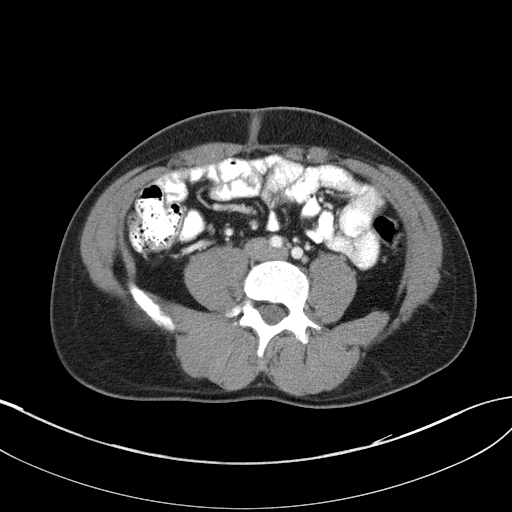
[im 48/92  soft-tissue]
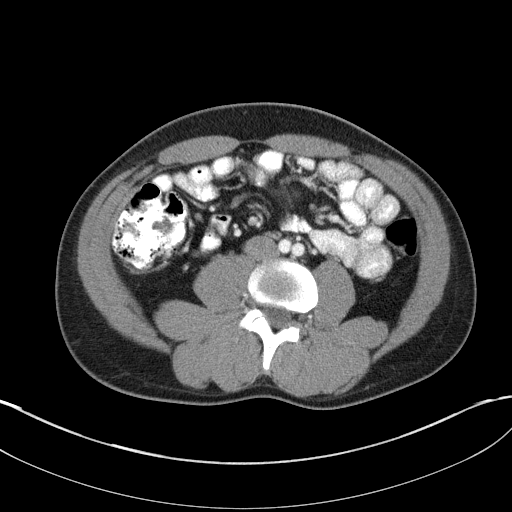
[im 55/92  soft-tissue]
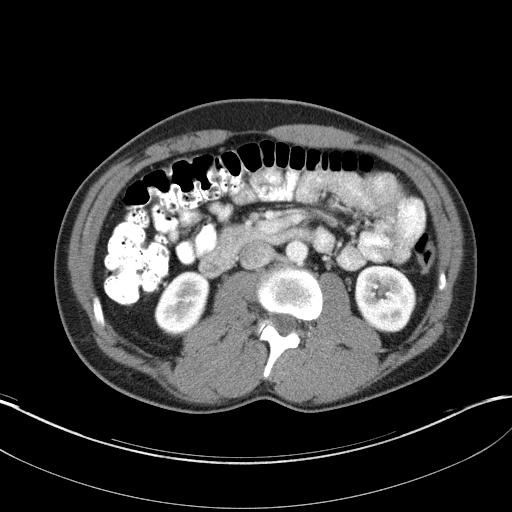
[im 55/92  bone]
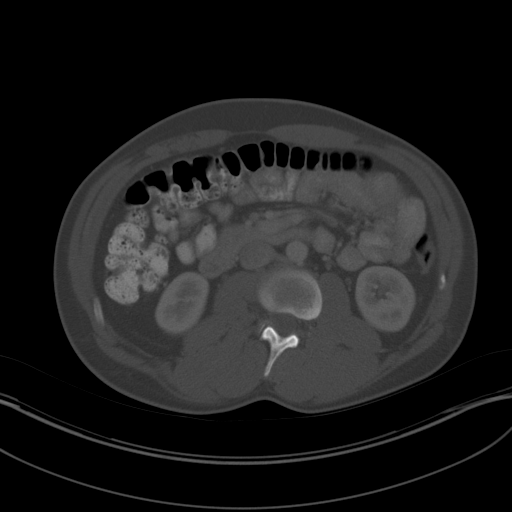
[im 62/92  soft-tissue]
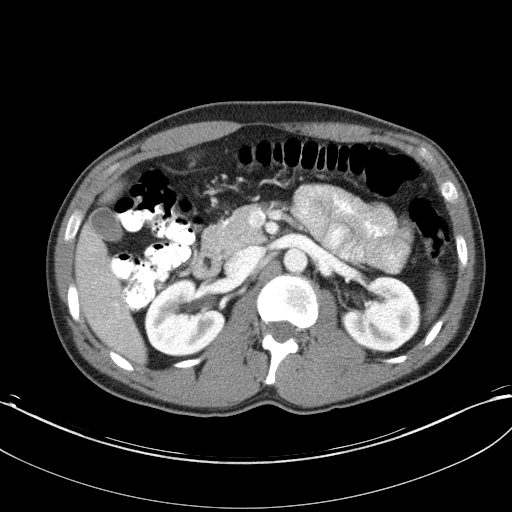
[im 70/92  soft-tissue]
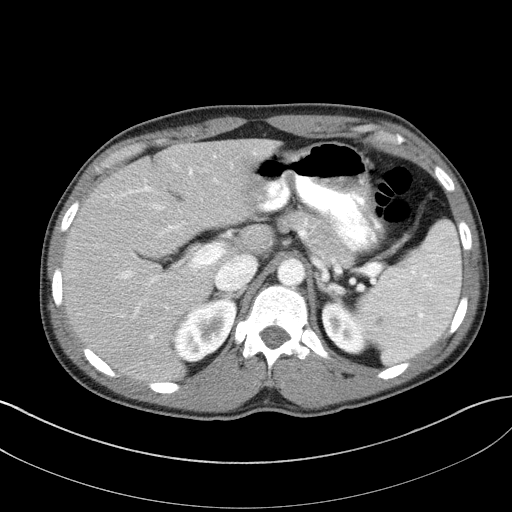
[im 73/92  soft-tissue]
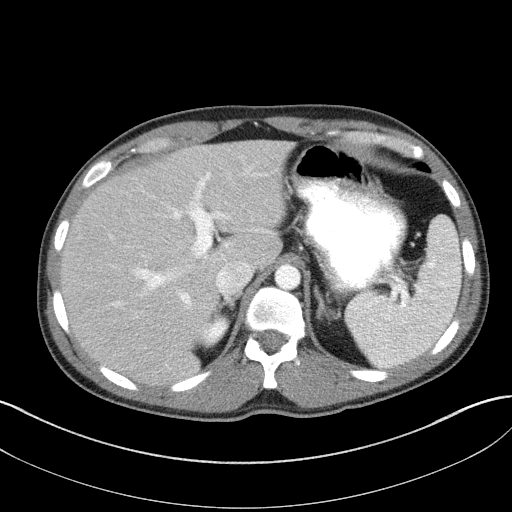
[im 81/92  soft-tissue]
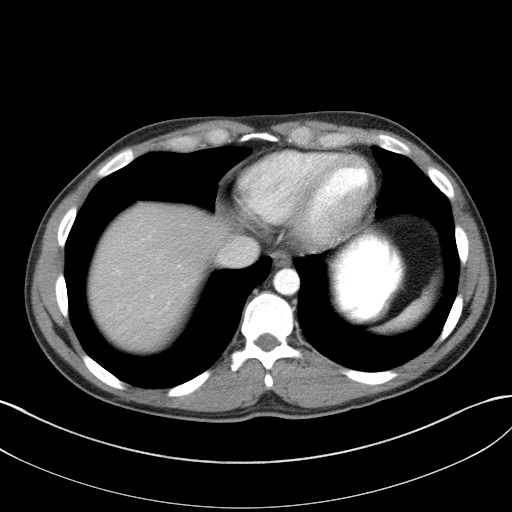
[im 88/92  soft-tissue]
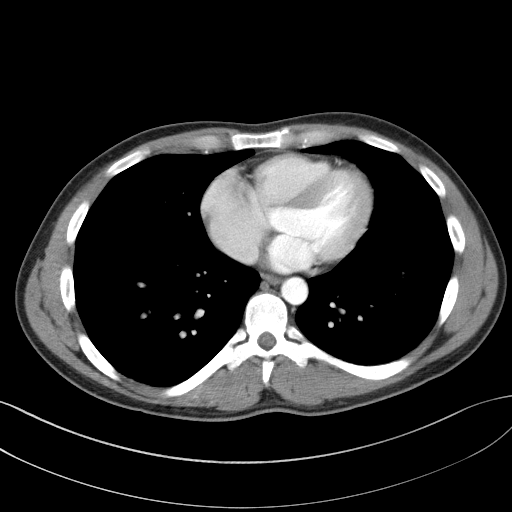

[Series 5: abd/pelvis 3.0 coronal · coronal · 0.98mm/px · 3 of 73 slices shown]
[im 25/73  soft-tissue]
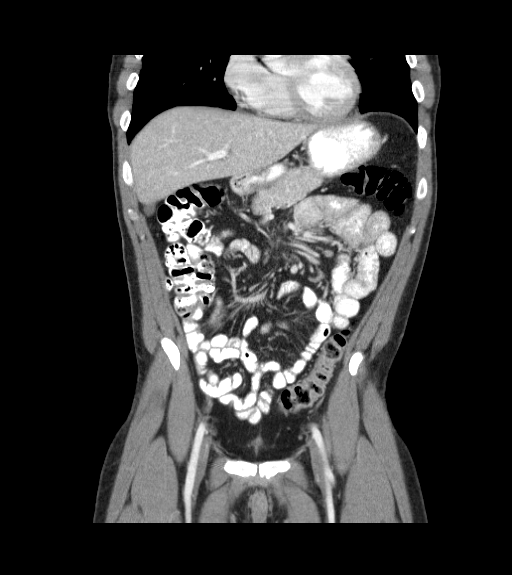
[im 33/73  soft-tissue]
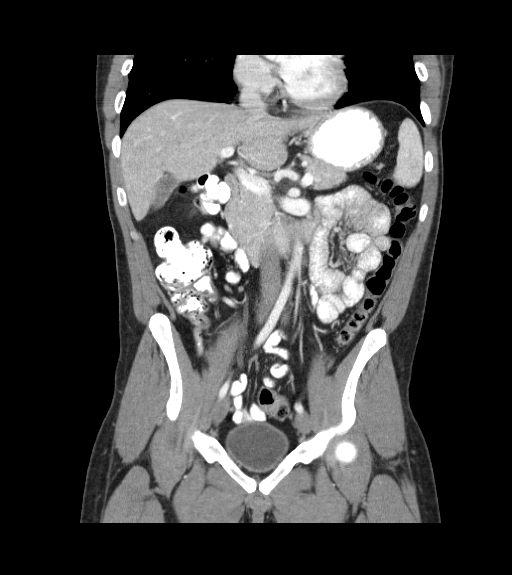
[im 41/73  soft-tissue]
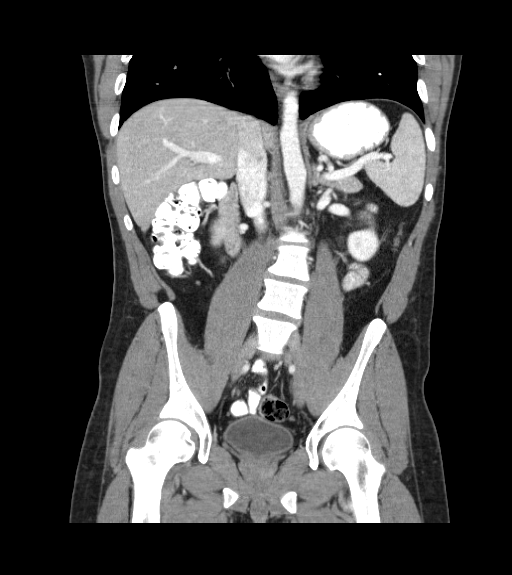

[17 of 46 positions shown; findings below may reference images not displayed]

FINDINGS: The lung bases are clear and there is no pleural
effusion.

The liver, gallbladder, biliary system and pancreas appear normal.
There is a tiny low density splenic lesion on image 23 which is
likely present on the prior study and of doubtful significance.
There is no adrenal mass.  Low-density renal lesions bilaterally
(image 36 on the right and image 39 on the left) are unchanged.
There is no hydronephrosis.

The stomach, small bowel, appendix and colon demonstrate no
significant findings.  The prostate gland is mildly enlarged but
appears stable.  The bladder and seminal vesicles appear normal.
No vascular abnormalities or enlarged lymph nodes are identified.
The inguinal regions appear stable with mild prominence of the fat
laterally on the right.

There is a stable convex left thoracolumbar scoliosis.
IMPRESSION: 1.  No acute abdominal pelvic findings.
2.  No evidence of recurrent left renal hernia or other clear
explanation for the patient's symptoms.  The prostate gland is
mildly prominent for age.
3.  Stable renal cysts.

## 2013-11-18 ENCOUNTER — Telehealth: Payer: Self-pay | Admitting: Family Medicine

## 2013-11-18 DIAGNOSIS — R109 Unspecified abdominal pain: Secondary | ICD-10-CM

## 2013-11-18 DIAGNOSIS — M6289 Other specified disorders of muscle: Secondary | ICD-10-CM

## 2013-11-18 NOTE — Telephone Encounter (Signed)
Caller name:Blazier, Ivin Booty Relation to AV:WUJW Call back number:[phone] Pharmacy:  Reason for call: pt is needing new rx  HYDROcodone-acetaminophen (NORCO) 10-325 MG per tablet Please call when available for pick up.  Pt also would like to know if his dr note is still available from his last visit

## 2013-11-18 NOTE — Telephone Encounter (Signed)
Last RX was done on 08-09-13 quantity 30 with 0 refills  Please advise about his Dr note? And refill?

## 2013-11-19 NOTE — Telephone Encounter (Signed)
He can have a refill on the hydrocodone and I assume he means the note/letter from his 8/24 visit, he can have acopy if he needs it

## 2013-11-21 MED ORDER — HYDROCODONE-ACETAMINOPHEN 10-325 MG PO TABS: 1.0000 | ORAL_TABLET | Freq: Three times a day (TID) | ORAL | Status: DC | PRN

## 2013-11-21 NOTE — Telephone Encounter (Signed)
Reprinted letter and printed rx  Detailed message left on patients vm

## 2013-11-24 NOTE — Telephone Encounter (Signed)
Pt called and said the work note he picked up has the wrong dates on it.  The note needs to be for 8/24 and 8/25.  Please advise.

## 2013-11-28 NOTE — Telephone Encounter (Signed)
Please inform pt that the new work note is done and can be picked up after 2 today

## 2013-11-28 NOTE — Telephone Encounter (Signed)
Left message informing patient of this.  °

## 2013-12-05 ENCOUNTER — Ambulatory Visit (INDEPENDENT_AMBULATORY_CARE_PROVIDER_SITE_OTHER): Payer: Federal, State, Local not specified - PPO | Admitting: Family

## 2013-12-05 ENCOUNTER — Encounter: Payer: Self-pay | Admitting: Family

## 2013-12-05 VITALS — BP 123/85 | HR 74 | Temp 98.2°F | Resp 16 | Ht 67.5 in | Wt 181.2 lb

## 2013-12-05 DIAGNOSIS — R110 Nausea: Secondary | ICD-10-CM | POA: Insufficient documentation

## 2013-12-05 MED ORDER — PROMETHAZINE HCL 12.5 MG PO TABS: 12.5000 mg | ORAL_TABLET | Freq: Three times a day (TID) | ORAL | Status: DC | PRN

## 2013-12-05 NOTE — Progress Notes (Signed)
   Subjective:    Patient ID: PANFILO KETCHUM, male    DOB: 1980-11-17, 33 y.o.   MRN: [id number]  HPI  33 y/o male presents today for an acute visit. Indicates he has had a bout of nausea that started this morning. He has been feeling hot/cold throughout the day. Reports wife recently had a viral infection and has now improved.   Reports pelvic condition has acted up more recently. Being treated by Dr. Rogelia Rohrer for this. Continues to take the hydrocodone as needed, usually at night and the probiotic.   Review of Systems  General: Positive for "feeling drained". Denies fever, chills, or significant weight gain/loss. HENT:  Head: Denies headache or neck pain  Ears: Denies changes in hearing, ringing in ears, earache, drainage  Eyes: Denies loss/changes in vision, pain, redness, blurry/double vision, flashing  lights  Nose: Denies discharge, stuffiness, itching, nosebleed, sinus pain  Throat: Denies sore throat, hoarseness, dry mouth, sores, thrush Neck: Denies lumps, swollen glands, stiffness Respiratory: Denies shortness of breath, cough, sputum production, wheezing Cardiovascular: Denies chest pain/discomfort, tightness, palpitations, shortness of breath with activity, difficulty lying down, swelling, sudden awakening with shortness of breath Gastrointestinal: Positive for decreased appetite and nausea. Positive for constipation secondary to pelvic condition. Denies dysphasia, heartburn, nausea, change in bowel habits, rectal bleeding, diarrhea, yellow skin or eyes Urinary: Denies frequency, urgency, burning/pain, blood in urine, incontinence, change in urinary strength.    Objective:   Physical Exam BP 123/85  Pulse 74  Temp(Src) 98.2 F (36.8 C) (Oral)  Resp 16  Ht 5' 7.5" (1.715 m)  Wt 181 lb 3.2 oz (82.192 kg)  BMI 27.94 kg/m2  SpO2 98% Vitals and nursing notes reviewed.   Constitutional - Seated on the exam table in NAD. Appears stated age. Clothing is appropriate for  situation. Alert and oriented x 4. Speech is clear and answers questions appropriately.  HENT Left Ear: External canals without evidence of infection. TM is pearly, appropriate light reflex and no evidence of infection Right Ear: External canals without evidence of infection. TM is pearly, appropriate  light reflex and no evidence of infection Neck: Supple, trachea midline, thyroid appropriate, no masses Oropharynx: Pink and moist, no throat redness noted.  Lymph: No cervical lymphadenopathy noted. Abdomen: Bowel sounds present and normoactive in all quadrants. Abdomen is mildly tender, without rebound tenderness or rigidity. Percussion is appropriate, without palpable splenomegaly or hepatomegaly.      Assessment & Plan:

## 2013-12-05 NOTE — Assessment & Plan Note (Addendum)
Start phenergan as needed for nausea. Instructed to eat small meals as tolerated. Peppermint essential oils may also help with nausea.

## 2013-12-05 NOTE — Progress Notes (Signed)
Pre visit review using our clinic review tool, if applicable. No additional management support is needed unless otherwise documented below in the visit note. 

## 2013-12-05 NOTE — Patient Instructions (Signed)
The phenergan has been sent to your pharmacy. Remember this medication may make you drowsy. If the nausea does not improve, please let us know. Be sure to drink plenty of fluids and eat a diet rich in fruits and vegetables. Be sure to wash your hands to prevent germ transmission. Follow up with Dr. Rogelia Rohrer as needed.

## 2013-12-16 ENCOUNTER — Encounter: Payer: Self-pay | Admitting: Family Medicine

## 2013-12-16 ENCOUNTER — Telehealth: Payer: Self-pay | Admitting: Family Medicine

## 2013-12-16 ENCOUNTER — Ambulatory Visit (INDEPENDENT_AMBULATORY_CARE_PROVIDER_SITE_OTHER): Payer: Federal, State, Local not specified - PPO | Admitting: Family Medicine

## 2013-12-16 VITALS — BP 130/73 | HR 81 | Temp 98.2°F | Ht 67.5 in | Wt 181.0 lb

## 2013-12-16 DIAGNOSIS — R030 Elevated blood-pressure reading, without diagnosis of hypertension: Secondary | ICD-10-CM

## 2013-12-16 DIAGNOSIS — M545 Low back pain, unspecified: Secondary | ICD-10-CM | POA: Insufficient documentation

## 2013-12-16 DIAGNOSIS — Z0000 Encounter for general adult medical examination without abnormal findings: Secondary | ICD-10-CM

## 2013-12-16 DIAGNOSIS — M6289 Other specified disorders of muscle: Secondary | ICD-10-CM

## 2013-12-16 DIAGNOSIS — Z23 Encounter for immunization: Secondary | ICD-10-CM

## 2013-12-16 DIAGNOSIS — R109 Unspecified abdominal pain: Secondary | ICD-10-CM

## 2013-12-16 DIAGNOSIS — K5900 Constipation, unspecified: Secondary | ICD-10-CM

## 2013-12-16 HISTORY — DX: Low back pain, unspecified: M54.50

## 2013-12-16 LAB — CBC WITH DIFFERENTIAL/PLATELET
BASOS ABS: 0 10*3/uL (ref 0.0–0.1)
Basophils Relative: 0 % (ref 0–1)
Eosinophils Absolute: 0.1 10*3/uL (ref 0.0–0.7)
Eosinophils Relative: 1 % (ref 0–5)
HCT: 41.8 % (ref 39.0–52.0)
Hemoglobin: 15.2 g/dL (ref 13.0–17.0)
Lymphocytes Relative: 38 % (ref 12–46)
Lymphs Abs: 2.3 10*3/uL (ref 0.7–4.0)
MCH: 29.9 pg (ref 26.0–34.0)
MCHC: 36.4 g/dL — ABNORMAL HIGH (ref 30.0–36.0)
MCV: 82.1 fL (ref 78.0–100.0)
Monocytes Absolute: 0.5 10*3/uL (ref 0.1–1.0)
Monocytes Relative: 8 % (ref 3–12)
NEUTROS ABS: 3.2 10*3/uL (ref 1.7–7.7)
Neutrophils Relative %: 53 % (ref 43–77)
PLATELETS: 260 10*3/uL (ref 150–400)
RBC: 5.09 MIL/uL (ref 4.22–5.81)
RDW: 13.6 % (ref 11.5–15.5)
WBC: 6 10*3/uL (ref 4.0–10.5)

## 2013-12-16 LAB — HEPATIC FUNCTION PANEL
ALT: 20 U/L (ref 0–53)
AST: 19 U/L (ref 0–37)
Albumin: 4.7 g/dL (ref 3.5–5.2)
Alkaline Phosphatase: 53 U/L (ref 39–117)
BILIRUBIN DIRECT: 0.1 mg/dL (ref 0.0–0.3)
Indirect Bilirubin: 0.8 mg/dL (ref 0.2–1.2)
TOTAL PROTEIN: 7.3 g/dL (ref 6.0–8.3)
Total Bilirubin: 0.9 mg/dL (ref 0.2–1.2)

## 2013-12-16 LAB — LIPID PANEL
CHOLESTEROL: 189 mg/dL (ref 0–200)
HDL: 42 mg/dL (ref >39)
LDL Cholesterol: 127 mg/dL — ABNORMAL HIGH (ref 0–99)
TRIGLYCERIDES: 101 mg/dL (ref <150)
Total CHOL/HDL Ratio: 4.5 Ratio
VLDL: 20 mg/dL (ref 0–40)

## 2013-12-16 LAB — TSH: TSH: 0.993 u[IU]/mL (ref 0.350–4.500)

## 2013-12-16 MED ORDER — TRAMADOL HCL 50 MG PO TABS: 50.0000 mg | ORAL_TABLET | Freq: Three times a day (TID) | ORAL | Status: DC | PRN

## 2013-12-16 MED ORDER — METAXALONE 400 MG PO TABS: 400.0000 mg | ORAL_TABLET | Freq: Three times a day (TID) | ORAL | Status: DC | PRN

## 2013-12-16 NOTE — Telephone Encounter (Signed)
Caller name: Sharia ReeveJosh Relation to pt: self Call back number: [phone] Pharmacy:  Reason for call:   Patient states that dr blyth was going to give him a letter stating that restrictions were staying the same. Patient states that he needs a work note stating that he was here today. Patient is requesting letters to be mailed to him.

## 2013-12-16 NOTE — Progress Notes (Signed)
Patient ID: Jared Myers, male   DOB: 1980/08/30, 33 y.o.   MRN: [id number] Jared Myers [id number] 1980/08/30 12/16/2013      Progress Note-Follow Up  Subjective  Chief Complaint  Chief Complaint  Patient presents with  . Annual Exam    physical  . Injections    flu    HPI  Patient is a 33 year old male in today for routine medical care. In today for annual exam, generally doing well. 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 No recent change in restrictions. As long as he maintains his lifting and other minor restrictions he functions fairly well. No recent illness. Does note some intermittent low back pain. Denies CP/palp/SOB/HA/congestion/fevers/GI or GU c/o. Taking meds as prescribed  Past Medical History  Diagnosis Date  . Premature ventricular beat   . Pelvic floor dysfunction   . Acute upper respiratory infections of unspecified site 04/28/2012  . 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 Inguinal hernia 05/12/2012  . Splenomegaly 07/18/2013  . Infectious mononucleosis 07/18/2013  . Tremulousness 08/14/2013    History reviewed. No pertinent past surgical history.  History reviewed. No pertinent family history.  History   Social History  . Marital Status: Divorced    Spouse Name: N/A    Number of Children: N/A  . Years of Education: N/A   Occupational History  . Not on file.   Social History Main Topics  . Smoking status: Never Smoker   . Smokeless tobacco: Not on file  . Alcohol Use: No  . Drug Use: No  . Sexual Activity: No   Other Topics Concern  . Not on file   Social History Narrative  . No narrative on file    Current Outpatient Prescriptions on File Prior to Visit  Medication Sig Dispense Refill  . HYDROcodone-acetaminophen (NORCO) 10-325 MG per tablet Take 1 tablet by mouth every 8 (eight) hours as needed.  30 tablet  0  . promethazine (PHENERGAN) 12.5 MG tablet Take 1 tablet (12.5 mg total) by mouth  every 8 (eight) hours as needed for nausea or vomiting.  10 tablet  0   No current facility-administered medications on file prior to visit.    Allergies  Allergen Reactions  . Guaifenesin     REACTION: PVCs  . Prednisone     PVC    Review of Systems  Review of Systems  Constitutional: Negative for fever and malaise/fatigue.  HENT: Negative for congestion.   Eyes: Negative for discharge.  Respiratory: Negative for shortness of breath.   Cardiovascular: Negative for chest pain, palpitations and leg swelling.  Gastrointestinal: Positive for constipation. Negative for nausea, abdominal pain, diarrhea, blood in stool and melena.  Genitourinary: Negative for dysuria.  Musculoskeletal: Negative for falls.  Skin: Negative for rash.  Neurological: Negative for loss of consciousness and headaches.  Endo/Heme/Allergies: Negative for polydipsia.  Psychiatric/Behavioral: Negative for depression and suicidal ideas. The patient is not nervous/anxious and does not have insomnia.     Objective  BP 130/73  Pulse 81  Temp(Src) 98.2 F (36.8 C) (Oral)  Ht 5' 7.5" (1.715 m)  Wt 181 lb (82.101 kg)  BMI 27.91 kg/m2  SpO2 99%  Physical Exam  Physical Exam  Constitutional: He is oriented to person, place, and time and well-developed, well-nourished, and in no distress. No distress.  HENT:  Head: Normocephalic and atraumatic.  Eyes: Conjunctivae are normal.  Neck: Neck supple. No thyromegaly  present.  Cardiovascular: Normal rate, regular rhythm and normal heart sounds.   No murmur heard. Pulmonary/Chest: Effort normal and breath sounds normal. No respiratory distress.  Abdominal: He exhibits no distension and no mass. There is no tenderness.  Musculoskeletal: He exhibits no edema.  Neurological: He is alert and oriented to person, place, and time.  Skin: Skin is warm.  Psychiatric: Memory, affect and judgment normal.    Lab Results  Component Value Date   TSH 0.450 09/14/2012    Lab Results  Component Value Date   WBC 8.6 09/14/2012   HGB 15.7 09/14/2012   HCT 43.2 09/14/2012   MCV 83.7 09/14/2012   PLT 288 09/14/2012   Lab Results  Component Value Date   CREATININE 0.98 09/14/2012   BUN 13 09/14/2012   NA 137 09/14/2012   K 3.9 09/14/2012   CL 100 09/14/2012   CO2 27 09/14/2012   Lab Results  Component Value Date   ALT 17 09/14/2012   AST 16 09/14/2012   ALKPHOS 59 09/14/2012   BILITOT 0.7 09/14/2012     Assessment & Plan  ELEVATED BLOOD PRESSURE WITHOUT DIAGNOSIS OF HYPERTENSION Well controlled, no changes to meds. Encouraged heart healthy diet such as the DASH diet and exercise as tolerated.   Low back pain Encouraged moist heat and gentle stretching as tolerated. May try NSAIDs and prescription meds as directed and report if symptoms worsen or seek immediate care. Consider chiropractic, physical therapy prn  Pelvic floor dysfunction Unable to afford PT presently but with current lifting and work restrictions he is doing fairly well, needing less meds. Will try to switch from occasionaly Hydrocodone to Tramadol and Skelaxin prn.  Preventative health care Patient encouraged to maintain heart healthy diet, regular exercise, adequate sleep. Consider daily probiotics. Take medications as prescribed. Given flu shot today  Constipation Slightly worse recently. Encouraged increased hydration and fiber in diet. Daily probiotics. If bowels not moving can use MOM 2 tbls po in 4 oz of warm prune juice by mouth every 2-3 days. If no results then repeat in 4 hours with  Dulcolax suppository pr, may repeat again in 4 more hours as needed. Seek care if symptoms worsen. Consider daily Miralax and/or Dulcolax if symptoms persist.

## 2013-12-16 NOTE — Progress Notes (Signed)
Pre visit review using our clinic review tool, if applicable. No additional management support is needed unless otherwise documented below in the visit note. 

## 2013-12-16 NOTE — Patient Instructions (Addendum)
Curcumen caps daily, NOW company, [email or web address]  64 oz of fluids daily continue probiotics and add Benefiber once to twice daily  Encouraged increased hydration and fiber in diet. Daily probiotics. If bowels not moving can use MOM 2 tbls po in 4 oz of warm prune juice by mouth every 2-3 days. If no results then repeat in 4 hours with  Dulcolax suppository pr, may repeat again in 4 more hours as needed. Seek care if symptoms worsen. Consider daily Miralax and/or Dulcolax if symptoms persist.   Salon Pas patches and or gel   Constipation Constipation is when a person has fewer than three bowel movements a week, has difficulty having a bowel movement, or has stools that are dry, hard, or larger than normal. As people grow older, constipation is more common. If you try to fix constipation with medicines that make you have a bowel movement (laxatives), the problem may get worse. Long-term laxative use may cause the muscles of the colon to become weak. A low-fiber diet, not taking in enough fluids, and taking certain medicines may make constipation worse.  CAUSES   Certain medicines, such as antidepressants, pain medicine, iron supplements, antacids, and water pills.   Certain diseases, such as diabetes, irritable bowel syndrome (IBS), thyroid disease, or depression.   Not drinking enough water.   Not eating enough fiber-rich foods.   Stress or travel.   Lack of physical activity or exercise.   Ignoring the urge to have a bowel movement.   Using laxatives too much.  SIGNS AND SYMPTOMS   Having fewer than three bowel movements a week.   Straining to have a bowel movement.   Having stools that are hard, dry, or larger than normal.   Feeling full or bloated.   Pain in the lower abdomen.   Not feeling relief after having a bowel movement.  DIAGNOSIS  Your health care provider will take a medical history and perform a physical exam. Further testing may be done for  severe constipation. Some tests may include:  A barium enema X-ray to examine your rectum, colon, and, sometimes, your small intestine.   A sigmoidoscopy to examine your lower colon.   A colonoscopy to examine your entire colon. TREATMENT  Treatment will depend on the severity of your constipation and what is causing it. Some dietary treatments include drinking more fluids and eating more fiber-rich foods. Lifestyle treatments may include regular exercise. If these diet and lifestyle recommendations do not help, your health care provider may recommend taking over-the-counter laxative medicines to help you have bowel movements. Prescription medicines may be prescribed if over-the-counter medicines do not work.  HOME CARE INSTRUCTIONS   Eat foods that have a lot of fiber, such as fruits, vegetables, whole grains, and beans.  Limit foods high in fat and processed sugars, such as french fries, hamburgers, cookies, candies, and soda.   A fiber supplement may be added to your diet if you cannot get enough fiber from foods.   Drink enough fluids to keep your urine clear or pale yellow.   Exercise regularly or as directed by your health care provider.   Go to the restroom when you have the urge to go. Do not hold it.   Only take over-the-counter or prescription medicines as directed by your health care provider. Do not take other medicines for constipation without talking to your health care provider first.  SEEK IMMEDIATE MEDICAL CARE IF:   You have bright red blood in your stool.  Your constipation lasts for more than 4 days or gets worse.   You have abdominal or rectal pain.   You have thin, pencil-like stools.   You have unexplained weight loss. MAKE SURE YOU:   Understand these instructions.  Will watch your condition.  Will get help right away if you are not doing well or get worse. Document Released: 11/23/2003 Document Revised: 03/01/2013 Document Reviewed:  12/06/2012 [facility] Patient Information 2015 [place], [place]. This information is not intended to replace advice given to you by your health care provider. Make sure you discuss any questions you have with your health care provider.

## 2013-12-17 NOTE — Telephone Encounter (Signed)
Please see if you can print a last note or letter that lists the restrictions he can use. Then we can right him his letter

## 2013-12-18 ENCOUNTER — Encounter: Payer: Self-pay | Admitting: Family Medicine

## 2013-12-18 DIAGNOSIS — Z0000 Encounter for general adult medical examination without abnormal findings: Secondary | ICD-10-CM | POA: Insufficient documentation

## 2013-12-18 DIAGNOSIS — K5900 Constipation, unspecified: Secondary | ICD-10-CM

## 2013-12-18 HISTORY — DX: Constipation, unspecified: K59.00

## 2013-12-18 NOTE — Assessment & Plan Note (Signed)
Unable to afford PT presently but with current lifting and work restrictions he is doing fairly well, needing less meds. Will try to switch from occasionaly Hydrocodone to Tramadol and Skelaxin prn.

## 2013-12-18 NOTE — Assessment & Plan Note (Signed)
Encouraged moist heat and gentle stretching as tolerated. May try NSAIDs and prescription meds as directed and report if symptoms worsen or seek immediate care. Consider chiropractic, physical therapy prn

## 2013-12-18 NOTE — Assessment & Plan Note (Signed)
Patient encouraged to maintain heart healthy diet, regular exercise, adequate sleep. Consider daily probiotics. Take medications as prescribed. Given flu shot today 

## 2013-12-18 NOTE — Assessment & Plan Note (Signed)
Slightly worse recently. Encouraged increased hydration and fiber in diet. Daily probiotics. If bowels not moving can use MOM 2 tbls po in 4 oz of warm prune juice by mouth every 2-3 days. If no results then repeat in 4 hours with  Dulcolax suppository pr, may repeat again in 4 more hours as needed. Seek care if symptoms worsen. Consider daily Miralax and/or Dulcolax if symptoms persist.

## 2013-12-18 NOTE — Assessment & Plan Note (Signed)
Well controlled, no changes to meds. Encouraged heart healthy diet such as the DASH diet and exercise as tolerated.  °

## 2013-12-19 NOTE — Telephone Encounter (Signed)
No change in restrictions, just redo letter with new dates

## 2013-12-19 NOTE — Telephone Encounter (Signed)
Put an old letter on MD's desk

## 2013-12-20 NOTE — Telephone Encounter (Signed)
He can have a letter to cover the day he was out but I need to clarify I thought he wanted to maintain his same work restrictions is he now saying he needs no restrictions

## 2013-12-20 NOTE — Telephone Encounter (Signed)
Please advise on the restrictions for this letter.

## 2013-12-20 NOTE — Telephone Encounter (Signed)
°  Caller name: Ivin BootyJoshua  Relation to pt: self  Call back number: [phone]  Reason for call:   Pt would like to know the status when (no restriction) letter can be picked up?  Pt did not work 12/18/13 due to metaxalone (SKELAXIN) 400 MG tablet making him sick, pt requesting a doctor note. Pt stated Dr. Abner GreenspanBlyth is aware how anal working for the [employer] can be regarding requiring doctor note.

## 2013-12-21 NOTE — Telephone Encounter (Signed)
Spoke with pt. He is requesting a letter for appt on Friday and also for Sunday. States he took skelaxin Saturday night and became sick and was unable to work Sunday due to this. Also wants updated letter re: work restrictions to be the same as before (no night shift, no twisting, bending, squatting, etc. . ..).

## 2013-12-21 NOTE — Telephone Encounter (Signed)
Left message for pt to return my call.

## 2013-12-21 NOTE — Telephone Encounter (Signed)
OK to write the work note for Friday and Sunday and please update last letter with restrictions and print

## 2013-12-22 NOTE — Telephone Encounter (Signed)
Please inform patient that this letter is done and ready to be picked up

## 2013-12-23 ENCOUNTER — Telehealth: Payer: Self-pay | Admitting: Family Medicine

## 2013-12-23 NOTE — Telephone Encounter (Signed)
Left message for patient to see if he wants to pick up letter or have letter mailed to him

## 2013-12-23 NOTE — Telephone Encounter (Signed)
FYI

## 2013-12-23 NOTE — Telephone Encounter (Signed)
Patient Information:  Caller Name: Amalia HaileyDustin  Phone: [phone]  Patient: Jared Myers, Jared Myers  Gender: Male  DOB: 1980-03-26  Age: 33 Years  PCP: Danise EdgeBlyth, [facility](Family Practice)  Office Follow Up:  Does the office need to follow up with this patient?: No  Instructions For The Office: N/A  RN Note:  [facility] office, for triage of Patient for Chest pain and tingling. Patient states he has had intermittent dull aching and heaviness in his left chest, onset 12/21/13. Patient denies diaphoresis or dyspnea. Patient states pain is accompanied by nausea. Patient states he again developed left chest discomfort at approx. 0820 12/23/13, accompanied by nausea. Patient states discomfort lasted approx. 20 minutes. States discomfort was at Myers "3-4" on 1-10 scale and has eased but not completely resolved. Patient states he continues to have mild discomfort in his left chest. Patient states he developed tingling in his left arm at approx. 1000 12/23/13 lasting approx. 2-3 seconds. Denies tingling at present. Denies extremity weakness. Patient is currently at work. Care advice given per guidelines. Patient advised to call 911 now. Patient advised to chew Aspirin 325mg . after calling 911. Patient advised not to be alone. Patient verbalizes understanding. Patient states his Dad can take him to the ED. RN strongly advised for patient to call 911. Possible risk factors of not calling 911 explained to patient. Patient verbalizes understanding.   Symptoms  Reason For Call & Symptoms: Chest pain, numbness  Reviewed Health History In EMR: Yes  Reviewed Medications In EMR: Yes  Reviewed Allergies In EMR: Yes  Reviewed Surgeries / Procedures: Yes  Date of Onset of Symptoms: 12/21/2013  Guideline(s) Used:  Chest Pain  Disposition Per Guideline:   Call EMS 911 Now  Reason For Disposition Reached:   Chest pain lasting longer than 5 minutes and ANY of the following:  Over 660 years old Over 33  years old and at least one cardiac risk factor (i.e., high blood pressure, diabetes, high cholesterol, obesity, smoker or strong family history of heart disease) Pain is crushing, pressure-like, or heavy  Took nitroglycerin and chest pain was not relieved History of heart disease (i.e., angina, heart attack, bypass surgery, angioplasty, CHF)  Advice Given:  N/Myers  Patient Will Follow Care Advice:  YES

## 2013-12-23 NOTE — Telephone Encounter (Signed)
Caller name: Sharia ReeveJosh [phone]   Reason for call:  Pt called in with chest pains, tingling in left arm.  States its from this current medication.  Routed to CAN

## 2014-01-02 ENCOUNTER — Telehealth: Payer: Self-pay | Admitting: *Deleted

## 2014-01-02 NOTE — Telephone Encounter (Signed)
Received FMLA paperwork from patient. Forms filled out as much as possible and forwarded to Dr. Abner GreenspanBlyth for completion. JG//CMA

## 2014-01-03 DIAGNOSIS — Z7689 Persons encountering health services in other specified circumstances: Secondary | ICD-10-CM

## 2014-01-04 NOTE — Telephone Encounter (Signed)
Received completed forms by from Dr. Abner GreenspanBlyth. Called and left message for patient that originals are up front for him to pick up. JG//CMA

## 2014-01-24 ENCOUNTER — Ambulatory Visit (HOSPITAL_BASED_OUTPATIENT_CLINIC_OR_DEPARTMENT_OTHER)
Admission: RE | Admit: 2014-01-24 | Discharge: 2014-01-24 | Disposition: A | Discharge status: Home / Self Care | Payer: Federal, State, Local not specified - PPO | Source: Ambulatory Visit | Attending: Family Medicine | Admitting: Family Medicine

## 2014-01-24 ENCOUNTER — Other Ambulatory Visit: Payer: Self-pay | Admitting: Family Medicine

## 2014-01-24 ENCOUNTER — Ambulatory Visit (INDEPENDENT_AMBULATORY_CARE_PROVIDER_SITE_OTHER): Payer: Federal, State, Local not specified - PPO | Admitting: Family Medicine

## 2014-01-24 ENCOUNTER — Encounter: Payer: Self-pay | Admitting: Family Medicine

## 2014-01-24 VITALS — BP 119/90 | HR 85 | Temp 98.6°F | Ht 67.5 in | Wt 181.0 lb

## 2014-01-24 DIAGNOSIS — M25559 Pain in unspecified hip: Secondary | ICD-10-CM

## 2014-01-24 DIAGNOSIS — R109 Unspecified abdominal pain: Secondary | ICD-10-CM

## 2014-01-24 DIAGNOSIS — K5900 Constipation, unspecified: Secondary | ICD-10-CM

## 2014-01-24 DIAGNOSIS — N508 Other specified disorders of male genital organs: Secondary | ICD-10-CM | POA: Diagnosis present

## 2014-01-24 DIAGNOSIS — R300 Dysuria: Secondary | ICD-10-CM

## 2014-01-24 DIAGNOSIS — R197 Diarrhea, unspecified: Secondary | ICD-10-CM

## 2014-01-24 DIAGNOSIS — M545 Low back pain: Secondary | ICD-10-CM

## 2014-01-24 DIAGNOSIS — R030 Elevated blood-pressure reading, without diagnosis of hypertension: Secondary | ICD-10-CM

## 2014-01-24 DIAGNOSIS — M6289 Other specified disorders of muscle: Secondary | ICD-10-CM

## 2014-01-24 MED ORDER — CARISOPRODOL 350 MG PO TABS: 350.0000 mg | ORAL_TABLET | Freq: Three times a day (TID) | ORAL | Status: DC | PRN

## 2014-01-24 NOTE — Patient Instructions (Signed)
Testicular Self-Exam  A self-exam of your testicles is looking at and feeling your testicles for abnormal lumps or swelling. Several things can cause swelling, lumps, or pain in your testicles. Some of these causes are:  · Injuries.  · Puffiness, redness, and soreness (inflammation).  · Infection.  · Extra fluids around your testicle.  · Twisted testicles.  · Testicular cancer.  The testicles are easiest to check after warm baths or showers. They are harder to examine when you are cold.   Follow these steps while you are standing:  · Hold your penis away from your body.  · Roll one testicle between your thumb and finger. Feel the entire testicle.  · Roll the other testicle between your thumb and finger. Feel the entire testicle.  Feel for lumps, swelling, or discomfort. A normal testicle is egg shaped. It feels firm. It is smooth and not tender. The spermatic cord feels like a firm spaghetti-like cord. It is at the back of your testicle. Examine the crease between the front of your leg and your abdomen. Feel for any bumps that are tender. These could be enlarged lymph nodes.   Document Released: 05/23/2008 Document Revised: 12/15/2012 Document Reviewed: 08/16/2012  [employer]® Patient Information ©2015 [employer]. This information is not intended to replace advice given to you by your health care provider. Make sure you discuss any questions you have with your health care provider.

## 2014-01-24 NOTE — Progress Notes (Signed)
Pre visit review using our clinic review tool, if applicable. No additional management support is needed unless otherwise documented below in the visit note. 

## 2014-01-25 LAB — URINALYSIS
Bilirubin Urine: NEGATIVE
Hgb urine dipstick: NEGATIVE
Ketones, ur: NEGATIVE
Leukocytes, UA: NEGATIVE
Nitrite: NEGATIVE
PH: 6 (ref 5.0–8.0)
SPECIFIC GRAVITY, URINE: 1.025 (ref 1.000–1.030)
Total Protein, Urine: NEGATIVE
Urine Glucose: NEGATIVE
Urobilinogen, UA: 0.2 (ref 0.0–1.0)

## 2014-01-25 LAB — PSA: PSA: 0.52 ng/mL (ref 0.10–4.00)

## 2014-01-25 LAB — RENAL FUNCTION PANEL
Albumin: 4.6 g/dL (ref 3.5–5.2)
BUN: 12 mg/dL (ref 6–23)
CO2: 27 meq/L (ref 19–32)
Calcium: 9.4 mg/dL (ref 8.4–10.5)
Chloride: 104 meq/L (ref 96–112)
Creatinine, Ser: 1 mg/dL (ref 0.4–1.5)
GFR: 89.98 mL/min
Glucose, Bld: 88 mg/dL (ref 70–99)
Phosphorus: 3.5 mg/dL (ref 2.3–4.6)
Potassium: 4.2 meq/L (ref 3.5–5.1)
Sodium: 140 meq/L (ref 135–145)

## 2014-01-25 LAB — HEPATIC FUNCTION PANEL
ALT: 17 U/L (ref 0–53)
AST: 18 U/L (ref 0–37)
Albumin: 4.6 g/dL (ref 3.5–5.2)
Alkaline Phosphatase: 56 U/L (ref 39–117)
Bilirubin, Direct: 0.1 mg/dL (ref 0.0–0.3)
Total Bilirubin: 0.8 mg/dL (ref 0.2–1.2)
Total Protein: 7.5 g/dL (ref 6.0–8.3)

## 2014-01-25 LAB — CBC
HEMATOCRIT: 45.3 % (ref 39.0–52.0)
Hemoglobin: 15.3 g/dL (ref 13.0–17.0)
MCHC: 33.7 g/dL (ref 30.0–36.0)
MCV: 88.5 fl (ref 78.0–100.0)
Platelets: 279 10*3/uL (ref 150.0–400.0)
RBC: 5.12 Mil/uL (ref 4.22–5.81)
RDW: 13.5 % (ref 11.5–15.5)
WBC: 6 10*3/uL (ref 4.0–10.5)

## 2014-01-25 LAB — SEDIMENTATION RATE: Sed Rate: 7 mm/hr (ref 0–22)

## 2014-01-25 LAB — URINE CULTURE
Colony Count: NO GROWTH
ORGANISM ID, BACTERIA: NO GROWTH

## 2014-01-29 ENCOUNTER — Encounter: Payer: Self-pay | Admitting: Family Medicine

## 2014-01-29 NOTE — Progress Notes (Signed)
Jared FlakeJoshua A Strausbaugh  [id number] 25-Nov-1980 01/29/2014      Progress Note-Follow Up  Subjective  Chief Complaint  Chief Complaint  Patient presents with  . Pelvic Pain    different pain "worse", same area    HPI  Patient is a 33 y.o. male in today for routine medical care. Patient in today with flare in pelvic pain R>L. Denies any injury or falls, is c/o some low grader intermittent chills but no fever. Notes some urinary urgency frequency but no penile discharge or lesions. Mild constipation when this occurs no bloody or tarry stool. Denies CP/palp/SOB/HA/congestion/fevers/GI or GU c/o. Taking meds as prescribed  Past Medical History  Diagnosis Date  . Premature ventricular beat   . Pelvic floor dysfunction   . Acute upper respiratory infections of unspecified site 04/28/2012  . 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 Inguinal hernia 05/12/2012  . Splenomegaly 07/18/2013  . Infectious mononucleosis 07/18/2013  . Tremulousness 08/14/2013  . Low back pain 12/16/2013  . Preventative health care 12/18/2013  . Constipation 12/18/2013    History reviewed. No pertinent past surgical history.  Family History  Problem Relation Age of Onset  . Cancer Mother     Non Hodkins lymphoma, breast cancer  . Other Father     pituatary adenoma  . Cancer Paternal Uncle     recurrent prostate   . Heart disease Maternal Grandmother     chf  . Heart disease Maternal Grandfather     chf  . Heart disease Paternal Grandfather     chf  . Anemia Sister     History   Social History  . Marital Status: Divorced    Spouse Name: N/A    Number of Children: N/A  . Years of Education: N/A   Occupational History  . Not on file.   Social History Main Topics  . Smoking status: Never Smoker   . Smokeless tobacco: Not on file  . Alcohol Use: No  . Drug Use: No  . 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 Not on file   Social History Narrative    Current Outpatient Prescriptions on File Prior to Visit  Medication Sig Dispense Refill  . HYDROcodone-acetaminophen (NORCO) 10-325 MG per tablet Take 1 tablet by mouth every 8 (eight) hours as needed. 30 tablet 0  . promethazine (PHENERGAN) 12.5 MG tablet Take 1 tablet (12.5 mg total) by mouth every 8 (eight) hours as needed for nausea or vomiting. 10 tablet 0  . traMADol (ULTRAM) 50 MG tablet Take 1 tablet (50 mg total) by mouth every 8 (eight) hours as needed for moderate pain. 45 tablet 0   No current facility-administered medications on file prior to visit.    Allergies  Allergen Reactions  . Guaifenesin     REACTION: PVCs  . Prednisone     PVC    Review of Systems  Review of Systems  Constitutional: Positive for chills and malaise/fatigue. Negative for fever.  HENT: Negative for congestion.   Eyes: Negative for discharge.  Respiratory: Negative for shortness of breath.   Cardiovascular: Negative for chest pain, palpitations and leg swelling.  Gastrointestinal: Positive for abdominal pain and constipation. Negative for nausea, diarrhea, blood in stool and melena.  Genitourinary: Positive for urgency and frequency. Negative for dysuria and flank pain.  Musculoskeletal: Negative for falls.  Skin: Negative for  rash.  Neurological: Negative for loss of consciousness and headaches.  Endo/Heme/Allergies: Negative for polydipsia.  Psychiatric/Behavioral: Negative for depression and suicidal ideas. The patient is nervous/anxious. The patient does not have insomnia.     Objective  BP 119/90 mmHg  Pulse 85  Temp(Src) 98.6 F (37 C) (Oral)  Ht 5' 7.5" (1.715 m)  Wt 181 lb (82.101 kg)  BMI 27.91 kg/m2  SpO2 97%  Physical Exam  Physical Exam  Constitutional: He is oriented to person, place, and time and well-developed, well-nourished, and in no distress. No distress.  HENT:  Head: Normocephalic and atraumatic.   Eyes: Conjunctivae are normal.  Neck: Neck supple. No thyromegaly present.  Cardiovascular: Normal rate, regular rhythm and normal heart sounds.   No murmur heard. Pulmonary/Chest: Effort normal and breath sounds normal. No respiratory distress.  Abdominal: He exhibits no distension and no mass. There is no tenderness.  Musculoskeletal: He exhibits no edema.  Neurological: He is alert and oriented to person, place, and time.  Skin: Skin is warm.  Psychiatric: Memory, affect and judgment normal.    Lab Results  Component Value Date   TSH 0.993 12/16/2013   Lab Results  Component Value Date   WBC 6.0 01/24/2014   HGB 15.3 01/24/2014   HCT 45.3 01/24/2014   MCV 88.5 01/24/2014   PLT 279.0 01/24/2014   Lab Results  Component Value Date   CREATININE 1.0 01/24/2014   BUN 12 01/24/2014   NA 140 01/24/2014   K 4.2 01/24/2014   CL 104 01/24/2014   CO2 27 01/24/2014   Lab Results  Component Value Date   ALT 17 01/24/2014   AST 18 01/24/2014   ALKPHOS 56 01/24/2014   BILITOT 0.8 01/24/2014   Lab Results  Component Value Date   CHOL 189 12/16/2013   Lab Results  Component Value Date   HDL 42 12/16/2013   Lab Results  Component Value Date   LDLCALC 127* 12/16/2013   Lab Results  Component Value Date   TRIG 101 12/16/2013   Lab Results  Component Value Date   CHOLHDL 4.5 12/16/2013     Assessment & Plan  Constipation Encouraged increased hydration and fiber in diet. Daily probiotics. If bowels not moving can use MOM 2 tbls po in 4 oz of warm prune juice by mouth every 2-3 days. If no results then repeat in 4 hours with  Dulcolax suppository pr, may repeat again in 4 more hours as needed. Seek care if symptoms worsen. Consider daily Miralax and/or Dulcolax if symptoms persist.   ELEVATED BLOOD PRESSURE WITHOUT DIAGNOSIS OF HYPERTENSION Well controlled, no changes to meds. Encouraged heart healthy diet such as the DASH diet and exercise as tolerated.   Pelvic  floor dysfunction Has had a pain flare. Had been well controlled now worse, does note more pain on right than left and some mild chills. Testing unremarkable, ultrasound of testes negative. Encouraged to return to PT and may use meds prn.

## 2014-01-29 NOTE — Assessment & Plan Note (Signed)
Well controlled, no changes to meds. Encouraged heart healthy diet such as the DASH diet and exercise as tolerated.  °

## 2014-01-29 NOTE — Assessment & Plan Note (Signed)
Encouraged increased hydration and fiber in diet. Daily probiotics. If bowels not moving can use MOM 2 tbls po in 4 oz of warm prune juice by mouth every 2-3 days. If no results then repeat in 4 hours with  Dulcolax suppository pr, may repeat again in 4 more hours as needed. Seek care if symptoms worsen. Consider daily Miralax and/or Dulcolax if symptoms persist.  

## 2014-01-29 NOTE — Assessment & Plan Note (Signed)
Has had a pain flare. Had been well controlled now worse, does note more pain on right than left and some mild chills. Testing unremarkable, ultrasound of testes negative. Encouraged to return to PT and may use meds prn.

## 2014-02-01 NOTE — Telephone Encounter (Signed)
Additional information requested for FMLA paperwork. Forms forwarded to Dr. Abner GreenspanBlyth. JG//CMA

## 2014-02-08 NOTE — Telephone Encounter (Signed)
Forms mailed to [facility] [employer] [address] [phone] [place] [id number]. Copy for patient placed up front. JG//CMA

## 2014-03-15 ENCOUNTER — Ambulatory Visit (INDEPENDENT_AMBULATORY_CARE_PROVIDER_SITE_OTHER): Payer: Federal, State, Local not specified - PPO | Admitting: Physician Assistant

## 2014-03-15 ENCOUNTER — Encounter: Payer: Self-pay | Admitting: Physician Assistant

## 2014-03-15 VITALS — BP 118/82 | HR 77 | Temp 98.0°F | Resp 16 | Ht 67.5 in | Wt 181.2 lb

## 2014-03-15 DIAGNOSIS — B9689 Other specified bacterial agents as the cause of diseases classified elsewhere: Secondary | ICD-10-CM

## 2014-03-15 DIAGNOSIS — J00 Acute nasopharyngitis [common cold]: Secondary | ICD-10-CM

## 2014-03-15 DIAGNOSIS — J208 Acute bronchitis due to other specified organisms: Principal | ICD-10-CM

## 2014-03-15 MED ORDER — BENZONATATE 100 MG PO CAPS: 100.0000 mg | ORAL_CAPSULE | Freq: Two times a day (BID) | ORAL | Status: DC | PRN

## 2014-03-15 MED ORDER — AZITHROMYCIN 250 MG PO TABS: ORAL_TABLET | ORAL | Status: DC

## 2014-03-15 NOTE — Progress Notes (Signed)
Pre visit review using our clinic review tool, if applicable. No additional management support is needed unless otherwise documented below in the visit note/SLS  

## 2014-03-15 NOTE — Assessment & Plan Note (Signed)
Rx Azithromycin.  Rx [employer]. Increase fluids.  Rest. Humidifier in bedroom. OTC ES tylenol for throat irritation.

## 2014-03-15 NOTE — Progress Notes (Signed)
Patient presents to clinic today c/o worsening chest congestion, sore throat and fatigue > 1 week.  Endorses cough initially dry but now productive of thick greenish-brown sputum.  Denies fever, SOB or pleuritic chest pain. Denies recent travel.  Has taken Nyquil with little relief of symptoms.  Past Medical History  Diagnosis Date  . Premature ventricular beat   . Pelvic floor dysfunction   . Acute upper respiratory infections of unspecified site 04/28/2012  . 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 Inguinal hernia 05/12/2012  . Splenomegaly 07/18/2013  . Infectious mononucleosis 07/18/2013  . Tremulousness 08/14/2013  . Low back pain 12/16/2013  . Preventative health care 12/18/2013  . Constipation 12/18/2013    Current Outpatient Prescriptions on File Prior to Visit  Medication Sig Dispense Refill  . carisoprodol (SOMA) 350 MG tablet Take 1 tablet (350 mg total) by mouth 3 (three) times daily as needed for muscle spasms. 60 tablet 1   No current facility-administered medications on file prior to visit.    Allergies  Allergen Reactions  . Guaifenesin     REACTION: PVCs  . Prednisone     PVC    Family History  Problem Relation Age of Onset  . Cancer Mother     Non Hodkins lymphoma, breast cancer  . Other Father     pituatary adenoma  . Cancer Paternal Uncle     recurrent prostate   . Heart disease Maternal Grandmother     chf  . Heart disease Maternal Grandfather     chf  . Heart disease Paternal Grandfather     chf  . Anemia Sister     History   Social History  . Marital Status: Divorced    Spouse Name: N/A    Number of Children: N/A  . Years of Education: N/A   Social History Main Topics  . Smoking status: Never Smoker   . Smokeless tobacco: None  . Alcohol Use: No  . Drug Use: No  . 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 None   Social History  Narrative    Review of Systems - See HPI.  All other ROS are negative.  BP 118/82 mmHg  Pulse 77  Temp(Src) 98 F (36.7 C) (Oral)  Resp 16  Ht 5' 7.5" (1.715 m)  Wt 181 lb 4 oz (82.214 kg)  BMI 27.95 kg/m2  SpO2 96%  Physical Exam  Constitutional: He is oriented to person, place, and time and well-developed, well-nourished, and in no distress.  HENT:  Head: Normocephalic and atraumatic.  Right Ear: External ear normal.  Left Ear: External ear normal.  Nose: Nose normal.  Mouth/Throat: Oropharynx is clear and moist. No oropharyngeal exudate.  TM within normal limits bilaterally.  Eyes: Conjunctivae are normal. Pupils are equal, round, and reactive to light.  Neck: Neck supple.  Cardiovascular: Normal rate, regular rhythm, normal heart sounds and intact distal pulses.   Pulmonary/Chest: Effort normal and breath sounds normal. No respiratory distress. He has no wheezes. He has no rales. He exhibits no tenderness.  Lymphadenopathy:    He has no cervical adenopathy.  Neurological: He is alert and oriented to person, place, and time.  Skin: Skin is warm and dry. No rash noted.  Psychiatric: Affect normal.   Recent Results (from the past 2160 hour(s))  CBC w/Diff     Status: Abnormal  Collection Time: 12/16/13  5:35 PM  Result Value Ref Range   WBC 6.0 4.0 - 10.5 K/uL   RBC 5.09 4.22 - 5.81 MIL/uL   Hemoglobin 15.2 13.0 - 17.0 g/dL   HCT 41.8 39.0 - 52.0 %   MCV 82.1 78.0 - 100.0 fL   MCH 29.9 26.0 - 34.0 pg   MCHC 36.4 (H) 30.0 - 36.0 g/dL   RDW 13.6 11.5 - 15.5 %   Platelets 260 150 - 400 K/uL   Neutrophils Relative % 53 43 - 77 %   Neutro Abs 3.2 1.7 - 7.7 K/uL   Lymphocytes Relative 38 12 - 46 %   Lymphs Abs 2.3 0.7 - 4.0 K/uL   Monocytes Relative 8 3 - 12 %   Monocytes Absolute 0.5 0.1 - 1.0 K/uL   Eosinophils Relative 1 0 - 5 %   Eosinophils Absolute 0.1 0.0 - 0.7 K/uL   Basophils Relative 0 0 - 1 %   Basophils Absolute 0.0 0.0 - 0.1 K/uL   Smear Review  Criteria for review not met   Hepatic function panel     Status: None   Collection Time: 12/16/13  5:35 PM  Result Value Ref Range   Total Bilirubin 0.9 0.2 - 1.2 mg/dL   Bilirubin, Direct 0.1 0.0 - 0.3 mg/dL   Indirect Bilirubin 0.8 0.2 - 1.2 mg/dL   Alkaline Phosphatase 53 39 - 117 U/L   AST 19 0 - 37 U/L   ALT 20 0 - 53 U/L   Total Protein 7.3 6.0 - 8.3 g/dL   Albumin 4.7 3.5 - 5.2 g/dL  Lipid panel     Status: Abnormal   Collection Time: 12/16/13  5:35 PM  Result Value Ref Range   Cholesterol 189 0 - 200 mg/dL    Comment: ATP III Classification:       < 200        mg/dL        Desirable      200 - 239     mg/dL        Borderline High      >= 240        mg/dL        High     Triglycerides 101 <150 mg/dL   HDL 42 >39 mg/dL   Total CHOL/HDL Ratio 4.5 Ratio   VLDL 20 0 - 40 mg/dL   LDL Cholesterol 127 (H) 0 - 99 mg/dL    Comment:   Total Cholesterol/HDL Ratio:CHD Risk                        Coronary Heart Disease Risk Table                                        Men       Women          1/2 Average Risk              3.4        3.3              Average Risk              5.0        4.4           2X Average Risk  9.6        7.1           3X Average Risk             23.4       11.0 Use the calculated Patient Ratio above and the CHD Risk table  to determine the patient's CHD Risk. ATP III Classification (LDL):       < 100        mg/dL         Optimal      100 - 129     mg/dL         Near or Above Optimal      130 - 159     mg/dL         Borderline High      160 - 189     mg/dL         High       > 190        mg/dL         Very High    TSH     Status: None   Collection Time: 12/16/13  5:35 PM  Result Value Ref Range   TSH 0.993 0.350 - 4.500 uIU/mL  CBC     Status: None   Collection Time: 01/24/14  3:38 PM  Result Value Ref Range   WBC 6.0 4.0 - 10.5 K/uL   RBC 5.12 4.22 - 5.81 Mil/uL   Platelets 279.0 150.0 - 400.0 K/uL   Hemoglobin 15.3 13.0 - 17.0 g/dL    HCT 45.3 39.0 - 52.0 %   MCV 88.5 78.0 - 100.0 fl   MCHC 33.7 30.0 - 36.0 g/dL   RDW 13.5 11.5 - 15.5 %  Sed Rate (ESR)     Status: None   Collection Time: 01/24/14  3:38 PM  Result Value Ref Range   Sed Rate 7 0 - 22 mm/hr  PSA     Status: None   Collection Time: 01/24/14  3:38 PM  Result Value Ref Range   PSA 0.52 0.10 - 4.00 ng/mL  Urinalysis     Status: None   Collection Time: 01/24/14  3:38 PM  Result Value Ref Range   Color, Urine YELLOW Yellow;Lt. Yellow   APPearance CLEAR Clear   Specific Gravity, Urine 1.025 1.000-1.030   pH 6.0 5.0 - 8.0   Total Protein, Urine NEGATIVE Negative   Urine Glucose NEGATIVE Negative   Ketones, ur NEGATIVE Negative   Bilirubin Urine NEGATIVE Negative   Hgb urine dipstick NEGATIVE Negative   Urobilinogen, UA 0.2 0.0 - 1.0   Leukocytes, UA NEGATIVE Negative   Nitrite NEGATIVE Negative  Urine Culture     Status: None   Collection Time: 01/24/14  3:38 PM  Result Value Ref Range   Colony Count NO GROWTH    Organism ID, Bacteria NO GROWTH   Hepatic function panel     Status: None   Collection Time: 01/24/14  3:38 PM  Result Value Ref Range   Total Bilirubin 0.8 0.2 - 1.2 mg/dL   Bilirubin, Direct 0.1 0.0 - 0.3 mg/dL   Alkaline Phosphatase 56 39 - 117 U/L   AST 18 0 - 37 U/L   ALT 17 0 - 53 U/L   Total Protein 7.5 6.0 - 8.3 g/dL   Albumin 4.6 3.5 - 5.2 g/dL  Renal function panel     Status: None   Collection Time: 01/24/14  3:38 PM  Result Value Ref Range   Sodium 140 135 - 145 mEq/L   Potassium 4.2 3.5 - 5.1 mEq/L   Chloride 104 96 - 112 mEq/L   CO2 27 19 - 32 mEq/L   Calcium 9.4 8.4 - 10.5 mg/dL   Albumin 4.6 3.5 - 5.2 g/dL   BUN 12 6 - 23 mg/dL   Creatinine, Ser 1.0 0.4 - 1.5 mg/dL   Glucose, Bld 88 70 - 99 mg/dL   Phosphorus 3.5 2.3 - 4.6 mg/dL   GFR 89.98 >60.00 mL/min    Assessment/Plan: Acute bacterial bronchitis Rx Azithromycin.  Rx [employer] Increase fluids.  Rest. Humidifier in bedroom. OTC ES tylenol for  throat irritation.

## 2014-03-15 NOTE — Patient Instructions (Signed)
Please take antibiotic as directed.  Increase your fluid intake.  Get plenty of rest.  Use the Tessalon for cough.  Alternate tylenol and ibuprofen for throat irritation.  Place a humidifier in the bedroom.  Acute Bronchitis Bronchitis is when the airways that extend from the windpipe into the lungs get red, puffy, and painful (inflamed). Bronchitis often causes thick spit (mucus) to develop. This leads to a cough. A cough is the most common symptom of bronchitis. In acute bronchitis, the condition usually begins suddenly and goes away over time (usually in 2 weeks). Smoking, allergies, and asthma can make bronchitis worse. Repeated episodes of bronchitis may cause more lung problems. HOME CARE  Rest.  Drink enough fluids to keep your pee (urine) clear or pale yellow (unless you need to limit fluids as told by your doctor).  Only take over-the-counter or prescription medicines as told by your doctor.  Avoid smoking and secondhand smoke. These can make bronchitis worse. If you are a smoker, think about using nicotine gum or skin patches. Quitting smoking will help your lungs heal faster.  Reduce the chance of getting bronchitis again by:  Washing your hands often.  Avoiding people with cold symptoms.  Trying not to touch your hands to your mouth, nose, or eyes.  Follow up with your doctor as told. GET HELP IF: Your symptoms do not improve after 1 week of treatment. Symptoms include:  Cough.  Fever.  Coughing up thick spit.  Body aches.  Chest congestion.  Chills.  Shortness of breath.  Sore throat. GET HELP RIGHT AWAY IF:   You have an increased fever.  You have chills.  You have severe shortness of breath.  You have bloody thick spit (sputum).  You throw up (vomit) often.  You lose too much body fluid (dehydration).  You have a severe headache.  You faint. MAKE SURE YOU:   Understand these instructions.  Will watch your condition.  Will get help right  away if you are not doing well or get worse. Document Released: 08/13/2007 Document Revised: 10/27/2012 Document Reviewed: 08/17/2012 [facility] Patient Information 2015 [employer] [place]. This information is not intended to replace advice given to you by your health care provider. Make sure you discuss any questions you have with your health care provider.

## 2014-03-23 ENCOUNTER — Encounter: Payer: Self-pay | Admitting: Family Medicine

## 2014-03-23 ENCOUNTER — Ambulatory Visit (INDEPENDENT_AMBULATORY_CARE_PROVIDER_SITE_OTHER): Payer: Federal, State, Local not specified - PPO | Admitting: Family Medicine

## 2014-03-23 VITALS — BP 115/72 | HR 68 | Temp 98.5°F | Ht 67.5 in | Wt 185.4 lb

## 2014-03-23 DIAGNOSIS — B9689 Other specified bacterial agents as the cause of diseases classified elsewhere: Secondary | ICD-10-CM

## 2014-03-23 DIAGNOSIS — R300 Dysuria: Secondary | ICD-10-CM

## 2014-03-23 DIAGNOSIS — J208 Acute bronchitis due to other specified organisms: Secondary | ICD-10-CM

## 2014-03-23 DIAGNOSIS — J00 Acute nasopharyngitis [common cold]: Secondary | ICD-10-CM

## 2014-03-23 DIAGNOSIS — M6289 Other specified disorders of muscle: Secondary | ICD-10-CM

## 2014-03-23 NOTE — Progress Notes (Signed)
Pre visit review using our clinic review tool, if applicable. No additional management support is needed unless otherwise documented below in the visit note. 

## 2014-03-23 NOTE — Progress Notes (Signed)
KHAYMAN KIRSCH  [id number] 21-Apr-1980 03/23/2014      Progress Note-Follow Up  Subjective  Chief Complaint  Chief Complaint  Patient presents with  . Follow-up    3 mos    HPI  Patient is a 34 y.o. male in today for routine medical care. Patient notes an improvement in his respiratory symptoms. Minimal residual cough noted. Unfortunately he is noting increased lower abdominal pain and pelvic floor pain. He notes some discomfort with urination but no hematuria or fevers. Denies CP/palp/SOB/HA/congestion/fevers/GI c/o. Taking meds as prescribed  Past Medical History  Diagnosis Date  . Premature ventricular beat   . Pelvic floor dysfunction   . Acute upper respiratory infections of unspecified site 04/28/2012  . 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 Inguinal hernia 05/12/2012  . Splenomegaly 07/18/2013  . Infectious mononucleosis 07/18/2013  . Tremulousness 08/14/2013  . Low back pain 12/16/2013  . Preventative health care 12/18/2013  . Constipation 12/18/2013    History reviewed. No pertinent past surgical history.  Family History  Problem Relation Age of Onset  . Cancer Mother     Non Hodkins lymphoma, breast cancer  . Other Father     pituatary adenoma  . Cancer Paternal Uncle     recurrent prostate   . Heart disease Maternal Grandmother     chf  . Heart disease Maternal Grandfather     chf  . Heart disease Paternal Grandfather     chf  . Anemia Sister     History   Social History  . Marital Status: Divorced    Spouse Name: N/A    Number of Children: N/A  . Years of Education: N/A   Occupational History  . Not on file.   Social History Main Topics  . Smoking status: Never Smoker   . Smokeless tobacco: Not on file  . Alcohol Use: No  . Drug Use: No  . 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 Not on file   Social History Narrative     Current Outpatient Prescriptions on File Prior to Visit  Medication Sig Dispense Refill  . benzonatate (TESSALON) 100 MG capsule Take 1 capsule (100 mg total) by mouth 2 (two) times daily as needed for cough. 20 capsule 0  . carisoprodol (SOMA) 350 MG tablet Take 1 tablet (350 mg total) by mouth 3 (three) times daily as needed for muscle spasms. 60 tablet 1  . Multiple Vitamins-Minerals (ALIVE WOMENS GUMMY) CHEW Chew by mouth daily.     No current facility-administered medications on file prior to visit.    Allergies  Allergen Reactions  . Guaifenesin     REACTION: PVCs  . Prednisone     PVC    Review of Systems  Review of Systems  Constitutional: Negative for fever and malaise/fatigue.  HENT: Negative for congestion.   Eyes: Negative for discharge.  Respiratory: Negative for shortness of breath and stridor.   Cardiovascular: Negative for chest pain, palpitations and leg swelling.  Gastrointestinal: Positive for abdominal pain. Negative for nausea and diarrhea.  Genitourinary: Positive for dysuria. Negative for urgency, frequency, hematuria and flank pain.  Musculoskeletal: Negative for falls.  Skin: Negative for rash.  Neurological: Negative for loss of consciousness and headaches.  Endo/Heme/Allergies: Negative for polydipsia.  Psychiatric/Behavioral: Negative for depression and suicidal ideas. The patient is not nervous/anxious and does not have insomnia.  Objective  BP 115/72 mmHg  Pulse 68  Temp(Src) 98.5 F (36.9 C) (Oral)  Ht 5' 7.5" (1.715 m)  Wt 185 lb 6.4 oz (84.097 kg)  BMI 28.59 kg/m2  SpO2 100%  Physical Exam  Physical Exam  Constitutional: He is oriented to person, place, and time and well-developed, well-nourished, and in no distress. No distress.  HENT:  Head: Normocephalic and atraumatic.  Eyes: Conjunctivae are normal.  Neck: Neck supple. No thyromegaly present.  Cardiovascular: Normal rate, regular rhythm and normal heart sounds.   No  murmur heard. Pulmonary/Chest: Effort normal and breath sounds normal. No respiratory distress.  Abdominal: Soft. Bowel sounds are normal. He exhibits no distension and no mass. There is tenderness. There is no rebound and no guarding.  Musculoskeletal: He exhibits no edema.  Neurological: He is alert and oriented to person, place, and time.  Skin: Skin is warm.  Psychiatric: Memory, affect and judgment normal.    Lab Results  Component Value Date   TSH 0.993 12/16/2013   Lab Results  Component Value Date   WBC 6.0 01/24/2014   HGB 15.3 01/24/2014   HCT 45.3 01/24/2014   MCV 88.5 01/24/2014   PLT 279.0 01/24/2014   Lab Results  Component Value Date   CREATININE 1.0 01/24/2014   BUN 12 01/24/2014   NA 140 01/24/2014   K 4.2 01/24/2014   CL 104 01/24/2014   CO2 27 01/24/2014   Lab Results  Component Value Date   ALT 17 01/24/2014   AST 18 01/24/2014   ALKPHOS 56 01/24/2014   BILITOT 0.8 01/24/2014   Lab Results  Component Value Date   CHOL 189 12/16/2013   Lab Results  Component Value Date   HDL 42 12/16/2013   Lab Results  Component Value Date   LDLCALC 127* 12/16/2013   Lab Results  Component Value Date   TRIG 101 12/16/2013   Lab Results  Component Value Date   CHOLHDL 4.5 12/16/2013     Assessment & Plan  Acute bacterial bronchitis Cough improving. Encouraged increased rest and hydration, add probiotics, zinc such as Coldeze or Xicam. Treat fevers as needed   Pelvic floor dysfunction Recent flare in pain, may continue meds and referred for new course of physical therapy

## 2014-04-02 NOTE — Assessment & Plan Note (Signed)
Recent flare in pain, may continue meds and referred for new course of physical therapy

## 2014-04-02 NOTE — Assessment & Plan Note (Signed)
Cough improving. Encouraged increased rest and hydration, add probiotics, zinc such as Coldeze or Xicam. Treat fevers as needed

## 2014-04-05 ENCOUNTER — Ambulatory Visit: Payer: Federal, State, Local not specified - PPO | Admitting: Physical Therapy

## 2014-04-13 ENCOUNTER — Ambulatory Visit: Payer: Federal, State, Local not specified - PPO | Attending: Family Medicine | Admitting: Physical Therapy

## 2014-04-13 DIAGNOSIS — M6258 Muscle wasting and atrophy, not elsewhere classified, other site: Secondary | ICD-10-CM | POA: Insufficient documentation

## 2014-04-13 DIAGNOSIS — K5902 Outlet dysfunction constipation: Secondary | ICD-10-CM | POA: Insufficient documentation

## 2014-04-13 DIAGNOSIS — R300 Dysuria: Secondary | ICD-10-CM | POA: Diagnosis not present

## 2014-04-18 ENCOUNTER — Ambulatory Visit: Payer: Federal, State, Local not specified - PPO | Admitting: Physical Therapy

## 2014-04-18 DIAGNOSIS — K5902 Outlet dysfunction constipation: Secondary | ICD-10-CM | POA: Diagnosis not present

## 2014-05-10 ENCOUNTER — Ambulatory Visit: Payer: Federal, State, Local not specified - PPO | Attending: Family Medicine | Admitting: Physical Therapy

## 2014-05-10 ENCOUNTER — Encounter: Payer: Self-pay | Admitting: Physical Therapy

## 2014-05-10 DIAGNOSIS — M25552 Pain in left hip: Secondary | ICD-10-CM

## 2014-05-10 DIAGNOSIS — K5902 Outlet dysfunction constipation: Secondary | ICD-10-CM | POA: Insufficient documentation

## 2014-05-10 DIAGNOSIS — M62838 Other muscle spasm: Secondary | ICD-10-CM

## 2014-05-10 DIAGNOSIS — R300 Dysuria: Secondary | ICD-10-CM | POA: Diagnosis not present

## 2014-05-10 DIAGNOSIS — M6258 Muscle wasting and atrophy, not elsewhere classified, other site: Secondary | ICD-10-CM | POA: Diagnosis not present

## 2014-05-10 NOTE — Therapy (Signed)
[facility] [address] [place], [place], [address] Phone: [phone]   Fax:  [phone]  Physical Therapy Treatment  Patient Details  Name: Jared Myers MRN: [id number] Date of Birth: [birth year] Referring Provider:  Bradd Canary, MD  Encounter Date: 05/10/2014      PT End of Session - 05/10/14 1113    Visit Number 3   Date for PT Re-Evaluation 07/06/14   PT Start Time 1015   PT Stop Time 1110   PT Time Calculation (min) 55 min   Activity Tolerance Patient tolerated treatment well   Behavior During Therapy [facility] for tasks assessed/performed      Past Medical History  Diagnosis Date  . Premature ventricular beat   . Pelvic floor dysfunction   . Acute upper respiratory infections of unspecified site 04/28/2012  . 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 Inguinal hernia 05/12/2012  . Splenomegaly 07/18/2013  . Infectious mononucleosis 07/18/2013  . Tremulousness 08/14/2013  . Low back pain 12/16/2013  . Preventative health care 12/18/2013  . Constipation 12/18/2013    History reviewed. No pertinent past surgical history.  There were no vitals taken for this visit.  Visit Diagnosis:  Muscle spasm  Pain in joint, pelvic region and thigh, left      Subjective Assessment - 05/10/14 1019    Symptoms Patient has only taken pain medication 3 times which is an improvement.    Limitations Sitting;Standing   How long can you sit comfortably? 2 hours   How long can you stand comfortably? 3 hours   How long can you walk comfortably? 3-4 hours   Patient Stated Goals reduce pain, learn ways to manage pain   Currently in Pain? Yes   Pain Score 5    Pain Location Pelvis  left gluteal area   Pain Orientation Left   Pain Descriptors / Indicators Radiating   Pain Type Chronic pain   Pain Onset More than a month ago   Pain Frequency Intermittent   Aggravating Factors  standing, sitting, constant twisting  his trunk   Pain Relieving Factors stretches   Multiple Pain Sites No                    [facility] Adult PT Treatment/Exercise - 05/10/14 0001    Modalities   Modalities Ultrasound  with electrical stimulation   Ultrasound   Ultrasound Location left lumbar quadratus   Ultrasound Parameters 100% , 1/2 w/cm2, 8 min, with electrical stim   Ultrasound Goals Pain   Manual Therapy   Manual Therapy Massage;Myofascial release   Massage left quadratus, left obturator internist, left levator ani, hip adductors, quadricep                PT Education - 05/10/14 1112    Education provided Yes   Person(s) Educated Patient   Methods Explanation;Demonstration;Verbal cues   Comprehension Verbalized understanding;Returned demonstration          PT Short Term Goals - 05/10/14 1026    PT SHORT TERM GOAL #1   Title be independent with initial HEP for flexibility exercises   Time 4   Period Weeks   Status Achieved   PT SHORT TERM GOAL #2   Title be independent with initial HEP with toileting technique   Time 4   Period Weeks   Status New   PT SHORT TERM GOAL #3   Title be independent with initial HEP for self perineal massage  techniques the patient feels comfortable with   Time 4   Period Weeks   Status Achieved  getting a device to do massage outside the muscles   PT SHORT TERM GOAL #4   Title be independent with initial HEP with diaphragmatic breathing   Time 4   Period Weeks   Status Achieved   PT SHORT TERM GOAL #5   Title constipation reduced >/= due to reduced straining of pelvic floor   Status New  increased due to traveling           PT Long Term Goals - 05/10/14 1124    PT LONG TERM GOAL #1   Title demonstrate and/or verbalize techniques to reduc the risk of re-injury to include info on posture   Time 12   Period Weeks   Status Achieved   PT LONG TERM GOAL #2   Title be independent with advanced HEP for pelvic floor strengthening   Time 12    Period Weeks   Status New   PT LONG TERM GOAL #3   Title pain after ejaculation decreased >/= 75%   Time 12   Period Weeks   Status New   PT LONG TERM GOAL #4   Title pain with squatting decreased >/= 75%   Time 12   Period Weeks   Status New   PT LONG TERM GOAL #5   Title constipation reduced >/=60%   Time 12   Period Weeks   Status New   Additional Long Term Goals   Additional Long Term Goals Yes   PT LONG TERM GOAL #6   Title pain with home activiies decresaed >/= 75%   Time 12   Period Weeks   Status New   PT LONG TERM GOAL #7   Title return to roller skating with minimal pain   Time 12   Period Weeks   Status New   PT LONG TERM GOAL #8   Title pain with urination decreased >/= 60%   Time 12   Period Weeks   Status New               Plan - 05/10/14 1113    Clinical Impression Statement Patient reports he is 40% better and consistent with his home exercise program. Patient has trigger points in left pelvic floor and hip musculature.    Pt will benefit from skilled therapeutic intervention in order to improve on the following deficits Impaired flexibility;Decreased strength;Decreased mobility;Pain;Increased muscle spasms;Decreased activity tolerance   Rehab Potential Good   Clinical Impairments Affecting Rehab Potential None   PT Frequency 1x / week  Patient is coming every other week due to finances   PT Duration 12 weeks   PT Treatment/Interventions Moist Heat;Therapeutic activities;Patient/family education;Passive range of motion;Therapeutic exercise;Biofeedback;Ultrasound;Manual techniques;Neuromuscular re-education;Cryotherapy;Electrical Stimulation;Functional mobility training   PT Next Visit Plan educate on toileting technique, soft tissue work, flexibility exercises,    PT Home Exercise Plan Bridges, prone left hip extension   Consulted and Agree with Plan of Care Patient        Problem List Patient Active Problem List   Diagnosis Date Noted   . Acute bacterial bronchitis 03/15/2014  . Preventative health care 12/18/2013  . Constipation 12/18/2013  . Low back pain 12/16/2013  . Nausea alone 12/05/2013  . Tremulousness 08/14/2013  . Splenomegaly 07/18/2013  . Infectious mononucleosis 07/18/2013  . Sinusitis 05/22/2013  . Inguinal hernia 05/12/2012  . Pelvic floor dysfunction 04/28/2012  . Stress reaction 09/21/2011  . Atypical  chest pain 01/06/2011  . 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 PREMATURE VENTRICULAR CONTRACTIONS, FREQUENT 04/09/2010  . PROSTATITIS, CHRONIC 04/09/2010  . ELEVATED BLOOD PRESSURE WITHOUT DIAGNOSIS OF HYPERTENSION 04/09/2010  . GENITOURINARY DISORDER 04/09/2010    Chelsy Parrales, PT 05/10/2014, 11:29 AM  [facility] [address], [address] [place], [place], [address] Phone: [phone]   Fax:  [phone]

## 2014-05-10 NOTE — Patient Instructions (Addendum)
Instructed patient on how to use foam roll to massage the pelvic floor muscles in sitting and using the prickly roller ball on the hip adductors, quadriceps and Quadratus.  Patient verbally understands and able to return demonstration.

## 2014-05-24 ENCOUNTER — Encounter: Payer: Self-pay | Admitting: Physical Therapy

## 2014-05-24 ENCOUNTER — Ambulatory Visit: Payer: Federal, State, Local not specified - PPO | Admitting: Physical Therapy

## 2014-05-24 DIAGNOSIS — M62838 Other muscle spasm: Secondary | ICD-10-CM

## 2014-05-24 DIAGNOSIS — M25552 Pain in left hip: Secondary | ICD-10-CM

## 2014-05-24 DIAGNOSIS — K5902 Outlet dysfunction constipation: Secondary | ICD-10-CM | POA: Diagnosis not present

## 2014-05-24 NOTE — Therapy (Signed)
[facility] [address], [address] [place], [place], [address] Phone: [phone]   Fax:  [phone]  Physical Therapy Treatment  Patient Details  Name: Jared FlakeJoshua A Pack MRN: [id number] Date of Birth: 05/05/1980 Referring Provider:  Bradd CanaryBlyth, Stacey A, MD  Encounter Date: 05/24/2014      PT End of Session - 05/24/14 1055    Visit Number 4   Date for PT Re-Evaluation 07/06/14   PT Start Time 1015   PT Stop Time 1100   PT Time Calculation (min) 45 min   Activity Tolerance Patient tolerated treatment well   Behavior During Therapy [facility] for tasks assessed/performed      Past Medical History  Diagnosis Date  . Premature ventricular beat   . Pelvic floor dysfunction   . Acute upper respiratory infections of unspecified site 04/28/2012  . 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 Inguinal hernia 05/12/2012  . Splenomegaly 07/18/2013  . Infectious mononucleosis 07/18/2013  . Tremulousness 08/14/2013  . Low back pain 12/16/2013  . Preventative health care 12/18/2013  . Constipation 12/18/2013    History reviewed. No pertinent past surgical history.  There were no vitals filed for this visit.  Visit Diagnosis:  Muscle spasm  Pain in joint, pelvic region and thigh, left      Subjective Assessment - 05/24/14 1011    Symptoms I felt excellent after last visit.  I have had some stress and feel my muscle tension increase.  Patient reports have not been taking as much pain medication.  I saw the chiropractor to work on lumbar. Standing has improved by 50%. Sitting improved by 80%.    Limitations Standing   How long can you sit comfortably? 3 hours.    How long can you stand comfortably? pain level is 5/10. 4 hours   How long can you walk comfortably? 3-4 hours   Patient Stated Goals reduce pain, learn ways to manage pain   Currently in Pain? Yes   Pain Score 7    Pain Location Pelvis  left gluteal area   Pain Orientation Left    Pain Descriptors / Indicators --  pulsating   Pain Type Chronic pain   Pain Radiating Towards to low back   Pain Onset More than a month ago   Pain Frequency Intermittent   Aggravating Factors  squatting   Pain Relieving Factors stretches   Multiple Pain Sites No            [facility] PT Assessment - 05/24/14 0001    Precautions   Precautions None   Balance Screen   Has the patient fallen in the past 6 months No   Has the patient had a decrease in activity level because of a fear of falling?  No   Is the patient reluctant to leave their home because of a fear of falling?  No   Prior Function   Level of Independence Independent with basic ADLs                   [facility] Adult PT Treatment/Exercise - 05/24/14 0001    Modalities   Modalities Ultrasound  with electrical stimulation   Ultrasound   Ultrasound Parameters 100%, 301mhz,1.2 w/cm2, x 8min   Ultrasound Goals Pain   Manual Therapy   Manual Therapy Joint mobilization;Massage;Myofascial release   Joint Mobilization Sideglide to L1-L5   Massage left quadratus, left sacral sulcus, left lower abdominal   Myofascial Release left obturator internist, levator ani,  and left side of testicles    Physical therapist educated patient on how to perform self perineal massage on the left side of the pelvic floor.  Patient able to return demonstration.             PT Education - 05/24/14 1055    Education provided Yes   Education Details self soft tissue work to [employer]) Educated Patient   Methods Explanation;Demonstration;Tactile cues;Verbal cues   Comprehension Verbal cues required;Returned demonstration;Verbalized understanding          PT Short Term Goals - 05/24/14 1016    PT SHORT TERM GOAL #2   Title be independent with initial HEP with toileting technique   Time 4   Period Weeks   Status Achieved   PT SHORT TERM GOAL #3   Title be independent with initial HEP for self perineal massage  techniques the patient feels comfortable with   Time 4   Period Weeks   Status Achieved           PT Long Term Goals - 05/24/14 1016    PT LONG TERM GOAL #1   Title demonstrate and/or verbalize techniques to reduc the risk of re-injury to include info on posture   Time 12   Period Weeks   Status Achieved   PT LONG TERM GOAL #2   Title be independent with advanced HEP for pelvic floor strengthening   Time 12   Period Weeks   Status New  learning more exercises   PT LONG TERM GOAL #3   Title pain after ejaculation decreased >/= 75%   Time 12   Period Weeks   Status On-going  pain  level 6/10.  Improved by 25%   PT LONG TERM GOAL #4   Title pain with squatting decreased >/= 75%   Time 12   Period Weeks   Status New  25% better   PT LONG TERM GOAL #5   Title constipation reduced >/=60%   Time 12   Period Weeks   Status New  needs more probilotics   PT LONG TERM GOAL #6   Title pain with home activiies decresaed >/= 75%   Time 12   Period Weeks   Status New  not been doing any activities   PT LONG TERM GOAL #7   Title return to roller skating with minimal pain   Time 12   Period Weeks   Status New  has not done yet   PT LONG TERM GOAL #8   Title pain with urination decreased >/= 60%   Time 12   Period Weeks   Status New  improved by80%               Plan - 05/24/14 1055    Clinical Impression Statement Pain level after treatment is 3/10.    Pt will benefit from skilled therapeutic intervention in order to improve on the following deficits Impaired flexibility;Decreased strength;Decreased mobility;Pain;Increased muscle spasms;Decreased activity tolerance   Rehab Potential Good   Clinical Impairments Affecting Rehab Potential None   PT Frequency 1x / week   PT Duration 12 weeks   PT Treatment/Interventions Moist Heat;Therapeutic activities;Patient/family education;Passive range of motion;Therapeutic exercise;Biofeedback;Ultrasound;Manual  techniques;Neuromuscular re-education;Cryotherapy;Electrical Stimulation;Functional mobility training   PT Next Visit Plan soft tissue work, ultrasound   PT Home Exercise Plan Bridges, prone left hip extension   Consulted and Agree with Plan of Care Patient        Problem List Patient Active Problem  List   Diagnosis Date Noted  . Acute bacterial bronchitis 03/15/2014  . Preventative health care 12/18/2013  . Constipation 12/18/2013  . Low back pain 12/16/2013  . Nausea alone 12/05/2013  . Tremulousness 08/14/2013  . Splenomegaly 07/18/2013  . Infectious mononucleosis 07/18/2013  . Sinusitis 05/22/2013  . Inguinal hernia 05/12/2012  . Pelvic floor dysfunction 04/28/2012  . Stress reaction 09/21/2011  . Atypical chest pain 01/06/2011  . 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 PREMATURE VENTRICULAR CONTRACTIONS, FREQUENT 04/09/2010  . PROSTATITIS, CHRONIC 04/09/2010  . ELEVATED BLOOD PRESSURE WITHOUT DIAGNOSIS OF HYPERTENSION 04/09/2010  . GENITOURINARY DISORDER 04/09/2010    Cederic Mozley,PT 05/24/2014, 10:59 AM  [facility] [address], [address] [place], [place], [address] Phone: [phone]   Fax:  [phone]

## 2014-06-06 ENCOUNTER — Emergency Department (HOSPITAL_BASED_OUTPATIENT_CLINIC_OR_DEPARTMENT_OTHER): Payer: Federal, State, Local not specified - PPO

## 2014-06-06 ENCOUNTER — Telehealth: Payer: Self-pay | Admitting: Family Medicine

## 2014-06-06 ENCOUNTER — Emergency Department (HOSPITAL_BASED_OUTPATIENT_CLINIC_OR_DEPARTMENT_OTHER)
Admission: EM | Admit: 2014-06-06 | Discharge: 2014-06-06 | Disposition: A | Discharge status: Home / Self Care | Payer: Federal, State, Local not specified - PPO | Attending: Emergency Medicine | Admitting: Emergency Medicine

## 2014-06-06 ENCOUNTER — Encounter (HOSPITAL_BASED_OUTPATIENT_CLINIC_OR_DEPARTMENT_OTHER): Payer: Self-pay | Admitting: *Deleted

## 2014-06-06 DIAGNOSIS — Z8709 Personal history of other diseases of the respiratory system: Secondary | ICD-10-CM | POA: Insufficient documentation

## 2014-06-06 DIAGNOSIS — R6884 Jaw pain: Secondary | ICD-10-CM | POA: Insufficient documentation

## 2014-06-06 DIAGNOSIS — R0602 Shortness of breath: Secondary | ICD-10-CM | POA: Insufficient documentation

## 2014-06-06 DIAGNOSIS — Z87448 Personal history of other diseases of urinary system: Secondary | ICD-10-CM | POA: Diagnosis not present

## 2014-06-06 DIAGNOSIS — Z8719 Personal history of other diseases of the digestive system: Secondary | ICD-10-CM | POA: Insufficient documentation

## 2014-06-06 DIAGNOSIS — R079 Chest pain, unspecified: Secondary | ICD-10-CM | POA: Insufficient documentation

## 2014-06-06 DIAGNOSIS — Z79899 Other long term (current) drug therapy: Secondary | ICD-10-CM | POA: Diagnosis not present

## 2014-06-06 DIAGNOSIS — Z8659 Personal history of other mental and behavioral disorders: Secondary | ICD-10-CM | POA: Insufficient documentation

## 2014-06-06 DIAGNOSIS — Z8679 Personal history of other diseases of the circulatory system: Secondary | ICD-10-CM | POA: Diagnosis not present

## 2014-06-06 DIAGNOSIS — M542 Cervicalgia: Secondary | ICD-10-CM | POA: Diagnosis not present

## 2014-06-06 LAB — BASIC METABOLIC PANEL
Anion gap: 7 (ref 5–15)
BUN: 14 mg/dL (ref 6–23)
CO2: 27 mmol/L (ref 19–32)
CREATININE: 0.95 mg/dL (ref 0.50–1.35)
Calcium: 8.9 mg/dL (ref 8.4–10.5)
Chloride: 104 mmol/L (ref 96–112)
GFR calc non Af Amer: >90 mL/min (ref >90)
Glucose, Bld: 116 mg/dL — ABNORMAL HIGH (ref 70–99)
POTASSIUM: 3.8 mmol/L (ref 3.5–5.1)
Sodium: 138 mmol/L (ref 135–145)

## 2014-06-06 LAB — CBC WITH DIFFERENTIAL/PLATELET
Basophils Absolute: 0 10*3/uL (ref 0.0–0.1)
Basophils Relative: 0 % (ref 0–1)
Eosinophils Absolute: 0.1 10*3/uL (ref 0.0–0.7)
Eosinophils Relative: 1 % (ref 0–5)
HEMATOCRIT: 43.6 % (ref 39.0–52.0)
Hemoglobin: 15.7 g/dL (ref 13.0–17.0)
LYMPHS PCT: 40 % (ref 12–46)
Lymphs Abs: 2.6 10*3/uL (ref 0.7–4.0)
MCH: 30.1 pg (ref 26.0–34.0)
MCHC: 36 g/dL (ref 30.0–36.0)
MCV: 83.7 fL (ref 78.0–100.0)
MONO ABS: 0.7 10*3/uL (ref 0.1–1.0)
Monocytes Relative: 10 % (ref 3–12)
NEUTROS ABS: 3.1 10*3/uL (ref 1.7–7.7)
NEUTROS PCT: 49 % (ref 43–77)
Platelets: 246 10*3/uL (ref 150–400)
RBC: 5.21 MIL/uL (ref 4.22–5.81)
RDW: 12.5 % (ref 11.5–15.5)
WBC: 6.4 10*3/uL (ref 4.0–10.5)

## 2014-06-06 LAB — TROPONIN I: Troponin I: <0.03 ng/mL (ref <0.031)

## 2014-06-06 LAB — D-DIMER, QUANTITATIVE: D-Dimer, Quant: <0.27 ug/mL-FEU (ref 0.00–0.48)

## 2014-06-06 NOTE — Discharge Instructions (Signed)
Workup for the chest pain without any specific findings. Follow-up with your regular doctor. Return for any new or worse symptoms. Continue current meds.

## 2014-06-06 NOTE — ED Notes (Signed)
Right sided pressure in his chest since this am. EKG at triage.

## 2014-06-06 NOTE — Telephone Encounter (Signed)
Called patient at [phone] [facility] *Preferred* to follow-up and left message to return call. eal

## 2014-06-06 NOTE — Telephone Encounter (Signed)
Please call and evaluate if the patient went to the ER per [facility] [employer] recommendations.

## 2014-06-06 NOTE — Telephone Encounter (Signed)
Patient Name: Jared Myers  DOB: 03-10-1981    Initial Comment Caller states he is having chest pain and he started taking Soma.    Nurse Assessment  Nurse: Renaldo FiddlerAdkins, RN, Raynelle FanningJulie Date/Time Lamount Cohen(Eastern Time): 06/06/2014 12:47:18 PM  Confirm and document reason for call. If symptomatic, describe symptoms. ---Caller states he has been on Soma for several mths, does not use it TID, but does use it at work and has needed it daily for the last week. . Adds he is constantly in pain. This morning on the way to the work he began to have rt sided chest pain, it has gotten worse and now it is in the middle of his chest. The chest pain is worse with movement or when he takes a deep breath. He took a dose at 10:15a, at least 2 hrs after the pain started.  Has the patient traveled out of the country within the last 30 days? ---Not Applicable  Does the patient require triage? ---Yes  Related visit to physician within the last 2 weeks? ---No  Does the PT have any chronic conditions? (i.e. diabetes, asthma, etc.) ---Yes  List chronic conditions. ---Hx chest pain, EUD, Chronic pelvic floor/back pain     Guidelines    Guideline Title Affirmed Question Affirmed Notes  Chest Pain Chest pain lasts > 5 minutes (Exceptions: chest pain occurring > 3 days ago and now asymptomatic; same as previously diagnosed heartburn and has accompanying sour taste in mouth)    Final Disposition User   Go to ED Now (or PCP triage) Renaldo FiddlerAdkins, RN, Raynelle FanningJulie

## 2014-06-06 NOTE — Telephone Encounter (Signed)
Patient stated he is on his way to [facility] ER, but unsure if he will stay due to cost.

## 2014-06-06 NOTE — ED Provider Notes (Addendum)
CSN: [id number]     Arrival date & time 06/06/14  1344 History   First MD Initiated Contact with Patient 06/06/14 1419     Chief Complaint  Patient presents with  . Chest Pain     (Consider location/radiation/quality/duration/timing/severity/associated sxs/prior Treatment) Patient is a 34 y.o. male presenting with chest pain. The history is provided by the patient.  Chest Pain Associated symptoms: shortness of breath   Associated symptoms: no abdominal pain, no back pain, no fever, no headache, no nausea, no palpitations and not vomiting    patient with onset of right-sided chest pain described as a pressure at 7:45 in the morning. Associated with some mild discomfort in the right jaw and right neck. Symptoms associated with some mild shortness of breath. Pain increased with taking a deep breath. Pain increased with movement of the right arm. Pain increased with palpation of the right anterior chest. No fall or injury.  Patient without any cardiac risk factors. No history of diabetes no history of hypertension negative family history for premature coronary artery disease. Patient is not a smoker. Patient denies having elevated cholesterol.  The pain has been constant since it started. Has not resolved. Patient followed by Dr. Abner GreenspanBlyth.  Past Medical History  Diagnosis Date  . Premature ventricular beat   . Pelvic floor dysfunction   . Acute upper respiratory infections of unspecified site 04/28/2012  . 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 Inguinal hernia 05/12/2012  . Splenomegaly 07/18/2013  . Infectious mononucleosis 07/18/2013  . Tremulousness 08/14/2013  . Low back pain 12/16/2013  . Preventative health care 12/18/2013  . Constipation 12/18/2013   History reviewed. No pertinent past surgical history. Family History  Problem Relation Age of Onset  . Cancer Mother     Non Hodkins lymphoma, breast cancer  . Other Father     pituatary adenoma  . Cancer Paternal Uncle    recurrent prostate   . Heart disease Maternal Grandmother     chf  . Heart disease Maternal Grandfather     chf  . Heart disease Paternal Grandfather     chf  . Anemia Sister    History  Substance Use Topics  . Smoking status: Never Smoker   . Smokeless tobacco: Not on file  . Alcohol Use: No    Review of Systems  Constitutional: Negative for fever.  HENT: Negative for congestion.   Eyes: Negative for visual disturbance.  Respiratory: Positive for shortness of breath.   Cardiovascular: Positive for chest pain. Negative for palpitations and leg swelling.  Gastrointestinal: Negative for nausea, vomiting and abdominal pain.  Genitourinary: Negative for hematuria.  Musculoskeletal: Positive for neck pain. Negative for back pain and neck stiffness.  Skin: Negative for rash.  Neurological: Negative for headaches.  Hematological: Does not bruise/bleed easily.  Psychiatric/Behavioral: Negative for confusion.      Allergies  Guaifenesin and Prednisone  Home Medications   Prior to Admission medications   Medication Sig Start Date End Date Taking? Authorizing Provider  benzonatate (TESSALON) 100 MG capsule Take 1 capsule (100 mg total) by mouth 2 (two) times daily as needed for cough. Patient not taking: Reported on 05/10/2014 03/15/14   Waldon MerlWilliam C Martin, PA-C  carisoprodol (SOMA) 350 MG tablet Take 1 tablet (350 mg total) by mouth 3 (three) times daily as needed for muscle spasms. 01/24/14   Bradd CanaryStacey A Blyth, MD  Multiple Vitamins-Minerals (ALIVE WOMENS GUMMY) CHEW Chew by mouth daily.    Historical Provider, MD  BP 122/80 mmHg  Pulse 84  Temp(Src) 98.2 F (36.8 C) (Oral)  Resp 20  Ht  (1.727 m)  Wt 179 lb (81.194 kg)  BMI 27.22 kg/m2  SpO2 99% Physical Exam  Constitutional: He is oriented to person, place, and time. He appears well-developed and well-nourished. No distress.  HENT:  Head: Normocephalic and atraumatic.  Eyes: Conjunctivae and EOM are normal. Pupils are  equal, round, and reactive to light.  Neck: Normal range of motion. Neck supple.  Cardiovascular: Regular rhythm and normal heart sounds.   No murmur heard. Except tachycardic.  Pulmonary/Chest: Effort normal and breath sounds normal. No respiratory distress. He has no wheezes. He has no rales. He exhibits tenderness.  Tenderness to palpation on the right side of the anterior chest. Mild not severe.  Abdominal: Soft. Bowel sounds are normal. There is no tenderness.  Musculoskeletal: Normal range of motion. He exhibits no edema or tenderness.  Neurological: He is alert and oriented to person, place, and time. No cranial nerve deficit. He exhibits normal muscle tone.  Skin: Skin is warm. No rash noted.  Nursing note and vitals reviewed.   ED Course  Procedures (including critical care time) Labs Review Labs Reviewed  TROPONIN I  CBC WITH DIFFERENTIAL/PLATELET  BASIC METABOLIC PANEL  D-DIMER, QUANTITATIVE   Results for orders placed or performed during the hospital encounter of 06/06/14  Troponin I  Result Value Ref Range   Troponin I <0.03 <0.031 ng/mL  CBC with Differential/Platelet  Result Value Ref Range   WBC 6.4 4.0 - 10.5 K/uL   RBC 5.21 4.22 - 5.81 MIL/uL   Hemoglobin 15.7 13.0 - 17.0 g/dL   HCT 16.1 09.6 - 04.5 %   MCV 83.7 78.0 - 100.0 fL   MCH 30.1 26.0 - 34.0 pg   MCHC 36.0 30.0 - 36.0 g/dL   RDW 40.9 81.1 - 91.4 %   Platelets 246 150 - 400 K/uL   Neutrophils Relative % 49 43 - 77 %   Neutro Abs 3.1 1.7 - 7.7 K/uL   Lymphocytes Relative 40 12 - 46 %   Lymphs Abs 2.6 0.7 - 4.0 K/uL   Monocytes Relative 10 3 - 12 %   Monocytes Absolute 0.7 0.1 - 1.0 K/uL   Eosinophils Relative 1 0 - 5 %   Eosinophils Absolute 0.1 0.0 - 0.7 K/uL   Basophils Relative 0 0 - 1 %   Basophils Absolute 0.0 0.0 - 0.1 K/uL  Basic metabolic panel  Result Value Ref Range   Sodium 138 135 - 145 mmol/L   Potassium 3.8 3.5 - 5.1 mmol/L   Chloride 104 96 - 112 mmol/L   CO2 27 19 - 32  mmol/L   Glucose, Bld 116 (H) 70 - 99 mg/dL   BUN 14 6 - 23 mg/dL   Creatinine, Ser 7.82 0.50 - 1.35 mg/dL   Calcium 8.9 8.4 - 95.6 mg/dL   GFR calc non Af Amer >90 >90 mL/min   GFR calc Af Amer >90 >90 mL/min   Anion gap 7 5 - 15  D-dimer, quantitative  Result Value Ref Range   D-Dimer, Quant <0.27 0.00 - 0.48 ug/mL-FEU    Imaging Review No results found.   EKG Interpretation   Date/Time:  Tuesday June 06 2014 13:49:58 EDT Ventricular Rate:  84 PR Interval:  156 QRS Duration: 84 QT Interval:  358 QTC Calculation: 423 R Axis:   79 Text Interpretation:  Normal sinus rhythm Cannot rule out Anterior  infarct  , age undetermined Abnormal ECG No significant change since last tracing  Confirmed by Quiana Cobaugh  MD, Alante Weimann [phone]) on 06/06/2014 2:15:23 PM      MDM   Final diagnoses:  Chest pain  SOB (shortness of breath)    Patient with acute onset of right anterior chest pain with some pleuritic type symptoms associated with shortness of breath some reproducible chest wall pain some increased discomfort with movement of the right arm but not as severe as the regular pain also shows shaved with some more concerning factors of some mild right jaw and right neck pain. Patient has no cardiac risk factors.  EKG without any acute changes. Patient will receive a troponin basic labs and a d-dimer. Patient was tachycardic when I examined him. Not hypoxic. Symptoms could be related to a pulmonary embolus. In addition symptoms could be related to a chest wall type pain. Rest of labs will help determine appropriate disposition.   Vanetta Mulders, MD 06/06/14 1448  Workup negative. Negative d-dimer negative troponin. Negative basic labs chest x-ray negative for pneumothorax pneumonia or pulmonary edema. Most likely chest wall nonspecific chest pain.  Vanetta Mulders, MD 06/06/14 1537

## 2014-06-14 ENCOUNTER — Ambulatory Visit: Payer: Federal, State, Local not specified - PPO | Admitting: Physical Therapy

## 2014-06-21 ENCOUNTER — Encounter: Payer: Self-pay | Admitting: Physical Therapy

## 2014-06-21 ENCOUNTER — Ambulatory Visit: Payer: Federal, State, Local not specified - PPO | Attending: Family Medicine | Admitting: Physical Therapy

## 2014-06-21 DIAGNOSIS — M62838 Other muscle spasm: Secondary | ICD-10-CM

## 2014-06-21 DIAGNOSIS — R300 Dysuria: Secondary | ICD-10-CM | POA: Insufficient documentation

## 2014-06-21 DIAGNOSIS — K5902 Outlet dysfunction constipation: Secondary | ICD-10-CM | POA: Insufficient documentation

## 2014-06-21 DIAGNOSIS — M6258 Muscle wasting and atrophy, not elsewhere classified, other site: Secondary | ICD-10-CM | POA: Insufficient documentation

## 2014-06-21 NOTE — Therapy (Signed)
[facility] [address] [place], [place], [address] Phone: [phone]   Fax:  [phone]  Physical Therapy Treatment  Patient Details  Name: Jared Myers MRN: [id number] Date of Birth: 03-Mar-1981 Referring Provider:  Bradd Canary, MD  Encounter Date: 06/21/2014      PT End of Session - 06/21/14 1144    Visit Number 5   Date for PT Re-Evaluation 07/06/14   PT Start Time 1100   PT Stop Time 1144   PT Time Calculation (min) 44 min   Activity Tolerance Patient tolerated treatment well   Behavior During Therapy [facility] for tasks assessed/performed      Past Medical History  Diagnosis Date  . Premature ventricular beat   . Pelvic floor dysfunction   . Acute upper respiratory infections of unspecified site 04/28/2012  . 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 Inguinal hernia 05/12/2012  . Splenomegaly 07/18/2013  . Infectious mononucleosis 07/18/2013  . Tremulousness 08/14/2013  . Low back pain 12/16/2013  . Preventative health care 12/18/2013  . Constipation 12/18/2013    History reviewed. No pertinent past surgical history.  There were no vitals filed for this visit.  Visit Diagnosis:  Muscle spasm      Subjective Assessment - 06/21/14 1104    Subjective Stretches and exercises have been helping.  I have not had to leave work until this week. I had to squat many times at work and had increased pain after 45 minutes.    Limitations Standing   How long can you sit comfortably? 3 hours.    How long can you stand comfortably? pain level is 5/10. 4 hours   How long can you walk comfortably? 3-4 hours   Patient Stated Goals reduce pain, learn ways to manage pain   Currently in Pain? Yes   Pain Score 7    Pain Location Pelvis   Pain Orientation Left   Pain Descriptors / Indicators Sharp  left abdomen   Pain Type Chronic pain   Pain Onset More than a month ago   Pain Frequency Intermittent   Aggravating Factors  squatting   Pain Relieving Factors stretches and soft tissue work   Multiple Pain Sites No                       [facility] Adult PT Treatment/Exercise - 06/21/14 0001    Modalities   Modalities Ultrasound  with electrical stimulation   Ultrasound   Ultrasound Goals Pain   Manual Therapy   Myofascial Release left obturator internist, left groin, left psoas, left levator ani                PT Education - 06/21/14 1143    Education provided Yes   Education Details warrior 2 yoga pose and hip Ir stretch   Person(s) Educated Patient   Methods Explanation;Handout;Verbal cues;Tactile cues;Demonstration   Comprehension Verbalized understanding;Returned demonstration          PT Short Term Goals - 06/21/14 1102    PT SHORT TERM GOAL #5   Time 4   Period Weeks   Status Achieved           PT Long Term Goals - 06/21/14 1103    PT LONG TERM GOAL #2   Title be independent with advanced HEP for pelvic floor strengthening   Time 12   Period Weeks   Status On-going  still learning exercises   PT LONG  TERM GOAL #3   Title pain after ejaculation decreased >/= 75%   Time 12   Period Weeks   Status On-going  20%   PT LONG TERM GOAL #4   Title pain with squatting decreased >/= 75%   Time 12   Period Weeks   Status On-going  only can do for 45 min.    PT LONG TERM GOAL #5   Title constipation reduced >/=60%   Time 12   Period Weeks   Status On-going  40%   PT LONG TERM GOAL #6   Title pain with home activiies decresaed >/= 75%   Time 12   Period Weeks   Status On-going  50%   PT LONG TERM GOAL #7   Title return to roller skating with minimal pain   Time 12   Period Weeks   Status New  has not returned yet   PT LONG TERM GOAL #8   Title pain with urination decreased >/= 60%   Time 12   Period Weeks   Status Achieved               Plan - 06/21/14 1144    Clinical Impression Statement Pain decreased to 4/10.   Patient had thickness in left levator ani.    Pt will benefit from skilled therapeutic intervention in order to improve on the following deficits Impaired flexibility;Decreased strength;Decreased mobility;Pain;Increased muscle spasms;Decreased activity tolerance   Rehab Potential Good   Clinical Impairments Affecting Rehab Potential None   PT Frequency 1x / week   PT Duration 12 weeks   PT Treatment/Interventions Moist Heat;Therapeutic activities;Patient/family education;Passive range of motion;Therapeutic exercise;Biofeedback;Ultrasound;Manual techniques;Neuromuscular re-education;Cryotherapy;Electrical Stimulation;Functional mobility training   PT Next Visit Plan write renewal, ultrasound, soft tissue work   PT Home Exercise Plan current HEP   Consulted and Agree with Plan of Care Patient        Problem List Patient Active Problem List   Diagnosis Date Noted  . Acute bacterial bronchitis 03/15/2014  . Preventative health care 12/18/2013  . Constipation 12/18/2013  . Low back pain 12/16/2013  . Nausea alone 12/05/2013  . Tremulousness 08/14/2013  . Splenomegaly 07/18/2013  . Infectious mononucleosis 07/18/2013  . Sinusitis 05/22/2013  . Inguinal hernia 05/12/2012  . Pelvic floor dysfunction 04/28/2012  . Stress reaction 09/21/2011  . Atypical chest pain 01/06/2011  . 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 PREMATURE VENTRICULAR CONTRACTIONS, FREQUENT 04/09/2010  . PROSTATITIS, CHRONIC 04/09/2010  . ELEVATED BLOOD PRESSURE WITHOUT DIAGNOSIS OF HYPERTENSION 04/09/2010  . GENITOURINARY DISORDER 04/09/2010    GRAY,CHERYL,PT 06/21/2014, 11:46 AM  [facility] [address] [place], [place], [id number] Phone: [phone]   Fax:  [phone]

## 2014-06-21 NOTE — Patient Instructions (Signed)
Warrior II   In wide stance, arms extended out, rotate right leg out 90, left leg in 20. Bend right leg 90 in line with foot. Keep left foot flat, hips square to front. Turn head right. Hold for _15___ breaths. Repeat on other side. NOTE: Keep bent knee behind line of toes. Arms overheadArm: Internal Rotation   Lay on back rotate left hip outward with knee inward.  Hold 30 sec 2 times.  Patient able to return demonstration correctly.

## 2014-06-22 ENCOUNTER — Ambulatory Visit (INDEPENDENT_AMBULATORY_CARE_PROVIDER_SITE_OTHER): Payer: Federal, State, Local not specified - PPO | Admitting: Family Medicine

## 2014-06-22 ENCOUNTER — Encounter: Payer: Self-pay | Admitting: Family Medicine

## 2014-06-22 VITALS — BP 108/72 | HR 84 | Temp 97.7°F | Ht 68.0 in | Wt 183.5 lb

## 2014-06-22 DIAGNOSIS — R030 Elevated blood-pressure reading, without diagnosis of hypertension: Secondary | ICD-10-CM

## 2014-06-22 DIAGNOSIS — R0789 Other chest pain: Secondary | ICD-10-CM | POA: Diagnosis not present

## 2014-06-22 DIAGNOSIS — K5900 Constipation, unspecified: Secondary | ICD-10-CM

## 2014-06-22 DIAGNOSIS — E663 Overweight: Secondary | ICD-10-CM | POA: Insufficient documentation

## 2014-06-22 DIAGNOSIS — M6289 Other specified disorders of muscle: Secondary | ICD-10-CM

## 2014-06-22 DIAGNOSIS — E782 Mixed hyperlipidemia: Secondary | ICD-10-CM

## 2014-06-22 DIAGNOSIS — K599 Functional intestinal disorder, unspecified: Secondary | ICD-10-CM | POA: Diagnosis not present

## 2014-06-22 HISTORY — DX: Overweight: E66.3

## 2014-06-22 HISTORY — DX: Mixed hyperlipidemia: E78.2

## 2014-06-22 MED ORDER — CARISOPRODOL 350 MG PO TABS: 350.0000 mg | ORAL_TABLET | Freq: Three times a day (TID) | ORAL | Status: DC | PRN

## 2014-06-22 NOTE — Assessment & Plan Note (Addendum)
Is in physical therapy now and he is finding it helpful and is able to do some exercises at home which are helping. Also seeing chiropractor for adjustments and that is helping. Is struggling with work again somewhat. They are pushing his work restrictions again. He has flared due to that. Will maintain previous restrictions as below: Also, It is my medical opinion that Jared SmallJoshua Myers should maintain his work restrictions due to numerous medical concerns. Due to his chronic symptoms he must maintain light duty and is unable to lift greater than 20 pounds. He is unable to drive any long distances secondary to increased pain with prolonged sitting. Will do best if he needs to drive less than 1/2 an hour. He is able to attempt an 8 hour shift but may need to limit his shifts to 4 hours depending on his level of discomfort as he works. He is in need of intermittent physician visits and physical therapy appointments to manage his current conditions adequately. Increased physical and mental stressors can contribute to worsening pain and debility. Hhe needs to have daytime hours due to increased pain while working night.

## 2014-06-22 NOTE — Progress Notes (Signed)
Pre visit review using our clinic review tool, if applicable. No additional management support is needed unless otherwise documented below in the visit note. 

## 2014-06-22 NOTE — Assessment & Plan Note (Signed)
Encouraged increased hydration and fiber in diet. Daily probiotics. If bowels not moving can use MOM 2 tbls po in 4 oz of warm prune juice by mouth every 2-3 days. If no results then repeat in 4 hours with  Dulcolax suppository pr, may repeat again in 4 more hours as needed. Seek care if symptoms worsen. Consider daily Miralax and/or Dulcolax if symptoms persist.  

## 2014-06-22 NOTE — Progress Notes (Signed)
Jared Myers  [id number] [birth year] 06/22/2014      Progress Note-Follow Up  Subjective  Chief Complaint  Chief Complaint  Patient presents with  . Follow-up    HPI  Patient is a 34 y.o. male in today for routine medical care. Patient is in today for follow-up doing fairly well. Has had a recent flare in his pelvic floor dysfunction and pain secondary to having to do work at work outside of his work restrictions. With any significant heavy lifting or changes in his work parameters his pain increases again. He is presently following with physical therapy and with home exercises does note an improvement in his pain. He denies any other new or acute complaints. Does note, works best for his pain when needed. Denies CP/palp/SOB/HA/congestion/fevers/GI or GU c/o. Taking meds as prescribed Past Medical History  Diagnosis Date  . Premature ventricular beat   . Pelvic floor dysfunction   . Acute upper respiratory infections of unspecified site 04/28/2012  . 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 Inguinal hernia 05/12/2012  . Splenomegaly 07/18/2013  . Infectious mononucleosis 07/18/2013  . Tremulousness 08/14/2013  . Low back pain 12/16/2013  . Preventative health care 12/18/2013  . Constipation 12/18/2013    History reviewed. No pertinent past surgical history.  Family History  Problem Relation Age of Onset  . Cancer Mother     Non Hodkins lymphoma, breast cancer  . Other Father     pituatary adenoma  . Cancer Paternal Uncle     recurrent prostate   . Heart disease Maternal Grandmother     chf  . Heart disease Maternal Grandfather     chf  . Heart disease Paternal Grandfather     chf  . Anemia Sister     History   Social History  . Marital Status: Divorced    Spouse Name: N/A  . Number of Children: N/A  . Years of Education: N/A   Occupational History  . Not on file.   Social History Main Topics  . Smoking status: Never Smoker   . Smokeless tobacco: Not  on file  . Alcohol Use: No  . Drug Use: No  . 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 Not on file   Social History Narrative    Current Outpatient Prescriptions on File Prior to Visit  Medication Sig Dispense Refill  . carisoprodol (SOMA) 350 MG tablet Take 1 tablet (350 mg total) by mouth 3 (three) times daily as needed for muscle spasms. 60 tablet 1   No current facility-administered medications on file prior to visit.    Allergies  Allergen Reactions  . Guaifenesin     REACTION: PVCs  . Prednisone     PVC    Review of Systems  Review of Systems  Constitutional: Negative for fever and malaise/fatigue.  HENT: Negative for congestion.   Eyes: Negative for discharge.  Respiratory: Negative for shortness of breath.   Cardiovascular: Negative for chest pain, palpitations and leg swelling.  Gastrointestinal: Positive for abdominal pain. Negative for nausea and diarrhea.  Genitourinary: Negative for dysuria.  Musculoskeletal: Negative for falls.  Skin: Negative for rash.  Neurological: Negative for loss of consciousness and headaches.  Endo/Heme/Allergies: Negative for polydipsia.  Psychiatric/Behavioral: Negative for depression and suicidal ideas. The patient is not nervous/anxious and does not have insomnia.     Objective  BP 108/72 mmHg  Pulse 84  Temp(Src) 97.7 F (36.5 C) (Oral)  Ht  (1.727 m)  Wt 183 lb 8 oz (83.235 kg)  BMI 27.91 kg/m2  SpO2 95%  Physical Exam  Physical Exam  Constitutional: He is oriented to person, place, and time and well-developed, well-nourished, and in no distress. No distress.  HENT:  Head: Normocephalic and atraumatic.  Eyes: Conjunctivae are normal.  Neck: Neck supple. No thyromegaly present.  Cardiovascular: Normal rate, regular rhythm and normal heart sounds.   No murmur heard. Pulmonary/Chest: Effort normal and breath  sounds normal. No respiratory distress.  Abdominal: He exhibits no distension and no mass. There is no tenderness.  Musculoskeletal: He exhibits no edema.  Neurological: He is alert and oriented to person, place, and time.  Skin: Skin is warm.  Psychiatric: Memory, affect and judgment normal.    Lab Results  Component Value Date   TSH 0.993 12/16/2013   Lab Results  Component Value Date   WBC 6.4 06/06/2014   HGB 15.7 06/06/2014   HCT 43.6 06/06/2014   MCV 83.7 06/06/2014   PLT 246 06/06/2014   Lab Results  Component Value Date   CREATININE 0.95 06/06/2014   BUN 14 06/06/2014   NA 138 06/06/2014   K 3.8 06/06/2014   CL 104 06/06/2014   CO2 27 06/06/2014   Lab Results  Component Value Date   ALT 17 01/24/2014   AST 18 01/24/2014   ALKPHOS 56 01/24/2014   BILITOT 0.8 01/24/2014   Lab Results  Component Value Date   CHOL 189 12/16/2013   Lab Results  Component Value Date   HDL 42 12/16/2013   Lab Results  Component Value Date   LDLCALC 127* 12/16/2013   Lab Results  Component Value Date   TRIG 101 12/16/2013   Lab Results  Component Value Date   CHOLHDL 4.5 12/16/2013     Assessment & Plan  Constipation Encouraged increased hydration and fiber in diet. Daily probiotics. If bowels not moving can use MOM 2 tbls po in 4 oz of warm prune juice by mouth every 2-3 days. If no results then repeat in 4 hours with  Dulcolax suppository pr, may repeat again in 4 more hours as needed. Seek care if symptoms worsen. Consider daily Miralax and/or Dulcolax if symptoms persist.    PFD (pelvic floor dysfunction) Is in physical therapy now and he is finding it helpful and is able to do some exercises at home which are helping. Also seeing chiropractor for adjustments and that is helping. Is struggling with work again somewhat. They are pushing his work restrictions again. He has flared due to that. Will maintain previous restrictions as below: Also, It is my medical  opinion that Jared Myers should maintain his work restrictions due to numerous medical concerns. Due to his chronic symptoms he must maintain light duty and is unable to lift greater than 20 pounds. He is unable to drive any long distances secondary to increased pain with prolonged sitting. Will do best if he needs to drive less than 1/2 an hour. He is able to attempt an 8 hour shift but may need to limit his shifts to 4 hours depending on his level of discomfort as he works. He is in need of intermittent physician visits and physical therapy appointments to manage his current conditions adequately. Increased physical and mental stressors can contribute to worsening pain and debility. Hhe needs to have daytime hours due to increased pain while  working night.   ELEVATED BLOOD PRESSURE WITHOUT DIAGNOSIS OF HYPERTENSION Well controlled, no changes to meds. Encouraged heart healthy diet such as the DASH diet and exercise as tolerated.    Atypical chest pain Went to ED recently work up was unremarkable and has not recurred. No associated symptoms   Hyperlipidemia, mixed Encouraged heart healthy diet, increase exercise, avoid trans fats, consider a krill oil cap daily   Overweight Encouraged DASH diet, decrease po intake and increase exercise as tolerated. Needs 7-8 hours of sleep nightly. Avoid trans fats, eat small, frequent meals every 4-5 hours with lean proteins, complex carbs and healthy fats. Minimize simple carbs

## 2014-06-22 NOTE — Assessment & Plan Note (Signed)
Well controlled, no changes to meds. Encouraged heart healthy diet such as the DASH diet and exercise as tolerated.  °

## 2014-06-22 NOTE — Assessment & Plan Note (Signed)
Encouraged DASH diet, decrease po intake and increase exercise as tolerated. Needs 7-8 hours of sleep nightly. Avoid trans fats, eat small, frequent meals every 4-5 hours with lean proteins, complex carbs and healthy fats. Minimize simple carbs 

## 2014-06-22 NOTE — Assessment & Plan Note (Signed)
Encouraged heart healthy diet, increase exercise, avoid trans fats, consider a krill oil cap daily 

## 2014-06-22 NOTE — Assessment & Plan Note (Signed)
Went to ED recently work up was unremarkable and has not recurred. No associated symptoms

## 2014-06-22 NOTE — Patient Instructions (Signed)
DASH Eating Plan °DASH stands for "Dietary Approaches to Stop Hypertension." The DASH eating plan is a healthy eating plan that has been shown to reduce high blood pressure (hypertension). Additional health benefits may include reducing the risk of type 2 diabetes mellitus, heart disease, and stroke. The DASH eating plan may also help with weight loss. °WHAT DO I NEED TO KNOW ABOUT THE DASH EATING PLAN? °For the DASH eating plan, you will follow these general guidelines: °· Choose foods with a percent daily value for sodium of less than 5% (as listed on the food label). °· Use salt-free seasonings or herbs instead of table salt or sea salt. °· Check with your health care provider or pharmacist before using salt substitutes. °· Eat lower-sodium products, often labeled as "lower sodium" or "no salt added." °· Eat fresh foods. °· Eat more vegetables, fruits, and low-fat dairy products. °· Choose whole grains. Look for the word "whole" as the first word in the ingredient list. °· Choose fish and skinless chicken or turkey more often than red meat. Limit fish, poultry, and meat to 6 oz (170 g) each day. °· Limit sweets, desserts, sugars, and sugary drinks. °· Choose heart-healthy fats. °· Limit cheese to 1 oz (28 g) per day. °· Eat more home-cooked food and less restaurant, buffet, and fast food. °· Limit fried foods. °· Cook foods using methods other than frying. °· Limit canned vegetables. If you do use them, rinse them well to decrease the sodium. °· When eating at a restaurant, ask that your food be prepared with less salt, or no salt if possible. °WHAT FOODS CAN I EAT? °Seek help from a dietitian for individual calorie needs. °Grains °Whole grain or whole wheat bread. Brown rice. Whole grain or whole wheat pasta. Quinoa, bulgur, and whole grain cereals. Low-sodium cereals. Corn or whole wheat flour tortillas. Whole grain cornbread. Whole grain crackers. Low-sodium crackers. °Vegetables °Fresh or frozen vegetables  (raw, steamed, roasted, or grilled). Low-sodium or reduced-sodium tomato and vegetable juices. Low-sodium or reduced-sodium tomato sauce and paste. Low-sodium or reduced-sodium canned vegetables.  °Fruits °All fresh, canned (in natural juice), or frozen fruits. °Meat and Other Protein Products °Ground beef (85% or leaner), grass-fed beef, or beef trimmed of fat. Skinless chicken or turkey. Ground chicken or turkey. Pork trimmed of fat. All fish and seafood. Eggs. Dried beans, peas, or lentils. Unsalted nuts and seeds. Unsalted canned beans. °Dairy °Low-fat dairy products, such as skim or 1% milk, 2% or reduced-fat cheeses, low-fat ricotta or cottage cheese, or plain low-fat yogurt. Low-sodium or reduced-sodium cheeses. °Fats and Oils °Tub margarines without trans fats. Light or reduced-fat mayonnaise and salad dressings (reduced sodium). Avocado. Safflower, olive, or canola oils. Natural peanut or almond butter. °Other °Unsalted popcorn and pretzels. °The items listed above may not be a complete list of recommended foods or beverages. Contact your dietitian for more options. °WHAT FOODS ARE NOT RECOMMENDED? °Grains °White bread. White pasta. White rice. Refined cornbread. Bagels and croissants. Crackers that contain trans fat. °Vegetables °Creamed or fried vegetables. Vegetables in a cheese sauce. Regular canned vegetables. Regular canned tomato sauce and paste. Regular tomato and vegetable juices. °Fruits °Dried fruits. Canned fruit in light or heavy syrup. Fruit juice. °Meat and Other Protein Products °Fatty cuts of meat. Ribs, chicken wings, bacon, sausage, bologna, salami, chitterlings, fatback, hot dogs, bratwurst, and packaged luncheon meats. Salted nuts and seeds. Canned beans with salt. °Dairy °Whole or 2% milk, cream, half-and-half, and cream cheese. Whole-fat or sweetened yogurt. Full-fat   cheeses or blue cheese. Nondairy creamers and whipped toppings. Processed cheese, cheese spreads, or cheese  curds. °Condiments °Onion and garlic salt, seasoned salt, table salt, and sea salt. Canned and packaged gravies. Worcestershire sauce. Tartar sauce. Barbecue sauce. Teriyaki sauce. Soy sauce, including reduced sodium. Steak sauce. Fish sauce. Oyster sauce. Cocktail sauce. Horseradish. Ketchup and mustard. Meat flavorings and tenderizers. Bouillon cubes. Hot sauce. Tabasco sauce. Marinades. Taco seasonings. Relishes. °Fats and Oils °Butter, stick margarine, lard, shortening, ghee, and bacon fat. Coconut, palm kernel, or palm oils. Regular salad dressings. °Other °Pickles and olives. Salted popcorn and pretzels. °The items listed above may not be a complete list of foods and beverages to avoid. Contact your dietitian for more information. °WHERE CAN I FIND MORE INFORMATION? °National Heart, [employer], and [employer] Institute: www.nhlbi.nih.gov/health/health-topics/topics/dash/ °Document Released: 02/13/2011 Document Revised: 07/11/2013 Document Reviewed: 12/29/2012 °ExitCare® Patient Information ©2015 [employer]. This information is not intended to replace advice given to you by your health care provider. Make sure you discuss any questions you have with your health care provider. ° °

## 2014-06-28 ENCOUNTER — Encounter: Payer: Federal, State, Local not specified - PPO | Admitting: Physical Therapy

## 2014-07-05 ENCOUNTER — Ambulatory Visit: Payer: Federal, State, Local not specified - PPO | Admitting: Physical Therapy

## 2014-07-05 ENCOUNTER — Encounter: Payer: Self-pay | Admitting: Physical Therapy

## 2014-07-05 DIAGNOSIS — M62838 Other muscle spasm: Secondary | ICD-10-CM

## 2014-07-05 DIAGNOSIS — K5902 Outlet dysfunction constipation: Secondary | ICD-10-CM | POA: Diagnosis not present

## 2014-07-05 DIAGNOSIS — M25552 Pain in left hip: Secondary | ICD-10-CM

## 2014-07-05 NOTE — Patient Instructions (Addendum)
ABDUCTION: Standing (Active)   Stand, feet flat. Slide right leg out to side. Foot on towel.breath in with leg out and in with leg coming in.  Complete _2__ sets of _10__ repetitions. Perform _1__ sessions per day. Then move leg forward and backward 10 times each side for 2 sets.  http://gtsc.exer.us/110   Copyright  VHI. All rights reserved.   Patient able to return demonstration correctly

## 2014-07-05 NOTE — Therapy (Signed)
[facility] [address], [address] [place], [place], [address] Phone: [phone]   Fax:  [phone]  Physical Therapy Treatment  Patient Details  Name: Jared Myers MRN: [id number] Date of Birth: 1980/08/08 Referring Provider:  Bradd CanaryBlyth, Stacey A, MD  Encounter Date: 07/05/2014      PT End of Session - 07/05/14 1236    Visit Number 6   Date for PT Re-Evaluation 09/21/14   PT Start Time 1015   PT Stop Time 1100   PT Time Calculation (min) 45 min   Activity Tolerance Patient tolerated treatment well   Behavior During Therapy [facility] for tasks assessed/performed      Past Medical History  Diagnosis Date  . Premature ventricular beat   . Pelvic floor dysfunction   . Acute upper respiratory infections of unspecified site 04/28/2012  . 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 Inguinal hernia 05/12/2012  . Splenomegaly 07/18/2013  . Infectious mononucleosis 07/18/2013  . Tremulousness 08/14/2013  . Low back pain 12/16/2013  . Preventative health care 12/18/2013  . Constipation 12/18/2013  . Hyperlipidemia, mixed 06/22/2014  . Overweight 06/22/2014    History reviewed. No pertinent past surgical history.  There were no vitals filed for this visit.  Visit Diagnosis:  Muscle spasm - Plan: PT plan of care cert/re-cert  Pain in joint, pelvic region and thigh, left - Plan: PT plan of care cert/re-cert      Subjective Assessment - 07/05/14 1022    Subjective I saw Dr. Abner GreenspanBlyth and she was happy with my progress. I am meeting with my roller skating coach and will try roller skating.    Limitations Standing   How long can you sit comfortably? 3 hours.    How long can you stand comfortably? pain level is 5/10. 4 hours   How long can you walk comfortably? 3-4 hours   Patient Stated Goals reduce pain, learn ways to manage pain   Currently in Pain? Yes   Pain Score 5    Pain Location Abdomen   Pain Orientation Left   Pain  Descriptors / Indicators Sharp   Pain Type Chronic pain   Pain Onset More than a month ago   Aggravating Factors  squatting, ejaculation   Pain Relieving Factors stretches and soft tissue work   Multiple Pain Sites No            [facility] PT Assessment - 07/05/14 0001    Assessment   Medical Diagnosis N81.84 pelvic floor dysfunction   ROM / Strength   AROM / PROM / Strength --  bil. hip strength 5/5                     [facility] Adult PT Treatment/Exercise - 07/05/14 0001    Exercises   Exercises Lumbar;Knee/Hip   Lumbar Exercises: Standing   Other Standing Lumbar Exercises diaphragmatic breathing with hip abduction/adduction with foot on towel 10x each side, then bring leg forward and backward   Lumbar Exercises: Seated   Other Seated Lumbar Exercises diaphragmatic breathing in sitting with lumbar movement   Knee/Hip Exercises: Standing   Knee Flexion Strengthening;Both;10 reps   Knee Flexion Limitations keep knee straight, slide foot on the towel on the floor with diaphragmatic breathing   Hip ADduction Strengthening;Both;10 reps   Hip ADduction Limitations keep foot on floor on towel with diaphragmatic breathing   Functional Squat 10 reps;Limitations;Other (comment)   Functional Squat Limitations one leg on table with hip  IR/ER with breathing   Other Standing Knee Exercises stand on right leg with diaphragmatic breathing as he hops to a 180 degree turn 5 times.  after he braced abdominals was able to complete without losing balance.    Manual Therapy   Joint Mobilization left hip for distraction and inferior glide grade 3   Massage Soft tissue work to left obturator internist, transverse perineum, levator ani while therapist was moving left hip in different directions to get further stretch of the pelvic floor muscles   Myofascial Release left pelvic floor muscles                 PT Education - 07/05/14 1235    Education provided Yes   Education Details hip  strengthening and diaphragmatic breathing   Person(s) Educated Patient   Methods Explanation;Demonstration;Tactile cues;Verbal cues;Handout   Comprehension Returned demonstration;Verbalized understanding          PT Short Term Goals - 06/21/14 1102    PT SHORT TERM GOAL #5   Time 4   Period Weeks   Status Achieved           PT Long Term Goals - 07/05/14 1021    PT LONG TERM GOAL #2   Title be independent with advanced HEP for pelvic floor strengthening   Time 12   Period Weeks   Status On-going   PT LONG TERM GOAL #3   Time 12   Period Weeks   Status On-going  no increase in pain. pain level 5/10   PT LONG TERM GOAL #4   Title pain with squatting decreased >/= 75%   Time 12   Period Weeks   Status On-going  pain 5/10, no improvement   PT LONG TERM GOAL #5   Title constipation reduced >/=60%   Time 12   Period Weeks   Status Achieved   PT LONG TERM GOAL #6   Title pain with home activiies decresaed >/= 75%   Time 12   Period Weeks   Status Achieved   PT LONG TERM GOAL #7   Title return to roller skating with minimal pain   Time 12   Period Weeks   Status New  going today to see   PT LONG TERM GOAL #8   Title pain with urination decreased >/= 60%   Time 12   Period Weeks   Status Achieved               Problem List Patient Active Problem List   Diagnosis Date Noted  . Hyperlipidemia, mixed 06/22/2014  . Overweight 06/22/2014  . Preventative health care 12/18/2013  . Constipation 12/18/2013  . Low back pain 12/16/2013  . Nausea alone 12/05/2013  . Tremulousness 08/14/2013  . Splenomegaly 07/18/2013  . Infectious mononucleosis 07/18/2013  . Sinusitis 05/22/2013  . Inguinal hernia 05/12/2012  . Pelvic floor dysfunction 04/28/2012  . Stress reaction 09/21/2011  . Atypical chest pain 01/06/2011  . 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 PREMATURE VENTRICULAR CONTRACTIONS, FREQUENT 04/09/2010  . PROSTATITIS, CHRONIC 04/09/2010   . ELEVATED BLOOD PRESSURE WITHOUT DIAGNOSIS OF HYPERTENSION 04/09/2010  . PFD (pelvic floor dysfunction) 04/09/2010    GRAY,CHERYL,PT 07/05/2014, 1:34 PM  [facility] [address], [address] [place], [place], [address] Phone: [phone]   Fax:  [phone]

## 2014-07-20 ENCOUNTER — Ambulatory Visit: Payer: Federal, State, Local not specified - PPO | Admitting: Physical Therapy

## 2014-08-02 ENCOUNTER — Encounter: Payer: Self-pay | Admitting: Physical Therapy

## 2014-08-02 ENCOUNTER — Ambulatory Visit: Payer: Federal, State, Local not specified - PPO | Attending: Family Medicine | Admitting: Physical Therapy

## 2014-08-02 DIAGNOSIS — M6258 Muscle wasting and atrophy, not elsewhere classified, other site: Secondary | ICD-10-CM | POA: Insufficient documentation

## 2014-08-02 DIAGNOSIS — K5902 Outlet dysfunction constipation: Secondary | ICD-10-CM | POA: Insufficient documentation

## 2014-08-02 DIAGNOSIS — M62838 Other muscle spasm: Secondary | ICD-10-CM

## 2014-08-02 DIAGNOSIS — R300 Dysuria: Secondary | ICD-10-CM | POA: Diagnosis not present

## 2014-08-02 DIAGNOSIS — M25552 Pain in left hip: Secondary | ICD-10-CM

## 2014-08-02 NOTE — Therapy (Signed)
[facility] [address] [place], [place], [address] Phone: [phone]   Fax:  [phone]  Physical Therapy Treatment  Patient Details  Name: Jared Myers MRN: [id number] Date of Birth: [birth year] Referring Provider:  Mosie Lukes, MD  Encounter Date: 08/02/2014      PT End of Session - 08/02/14 1026    Visit Number 7   Date for PT Re-Evaluation 09/21/14   PT Start Time 6659   PT Stop Time 1100   PT Time Calculation (min) 45 min   Activity Tolerance Patient tolerated treatment well   Behavior During Therapy [facility] for tasks assessed/performed      Past Medical History  Diagnosis Date  . Premature ventricular beat   . Pelvic floor dysfunction   . Acute upper respiratory infections of unspecified site 04/28/2012  . 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 Inguinal hernia 05/12/2012  . Splenomegaly 07/18/2013  . Infectious mononucleosis 07/18/2013  . Tremulousness 08/14/2013  . Low back pain 12/16/2013  . Preventative health care 12/18/2013  . Constipation 12/18/2013  . Hyperlipidemia, mixed 06/22/2014  . Overweight 06/22/2014    History reviewed. No pertinent past surgical history.  There were no vitals filed for this visit.  Visit Diagnosis:  Muscle spasm  Pain in joint, pelvic region and thigh, left      Subjective Assessment - 08/02/14 1019    Subjective Last week I was having more pain in left lumbar area.  I had to start sitting more and everything locked up. I felt better the next day.  I have been fine until yesterday and felt pulling the groin. I saw the chiropractor and sore from seeing him .    Currently in Pain? Yes   Pain Score 7    Pain Location Back  left psoas   Pain Orientation Left   Pain Descriptors / Indicators Sharp   Pain Type Chronic pain   Pain Onset More than a month ago   Aggravating Factors  squatting, ejaculation   Pain Relieving Factors stretches and soft tissue   Multiple Pain Sites No                         [facility] Adult PT Treatment/Exercise - 08/02/14 0001    Manual Therapy   Manual Therapy Soft tissue mobilization   Soft tissue mobilization left lumbar paraspinals, quadratus, left hip sdductor, levator ani, obturator internis and medial left obturator internist                PT Education - 08/02/14 1055    Education provided No          PT Short Term Goals - 06/21/14 1102    PT SHORT TERM GOAL #5   Time 4   Period Weeks   Status Achieved           PT Long Term Goals - 08/02/14 1025    PT LONG TERM GOAL #2   Title be independent with advanced HEP for pelvic floor strengthening   Period Weeks   Status On-going   PT LONG TERM GOAL #3   Title pain after ejaculation decreased >/= 75%   Time 12   Period Weeks   Status On-going   PT LONG TERM GOAL #4   Title pain with squatting decreased >/= 75%   Time 12   Period Weeks   Status On-going   PT LONG TERM GOAL #7  Title return to roller skating with minimal pain   Time 12   Period Weeks   Status Achieved  tried one time with no increased pain               Plan - 08/02/14 1055    Clinical Impression Statement Patient has had a flare-up form sitting too long.  Patient has not met goals due to flare-up.  Patient has seen the chiropractor to work on the lumbar spine.  Patient has increased trigger points inthe pelvic floor muscle and lumbar parssponial.  After treatment pain decreased to 3/10.    Pt will benefit from skilled therapeutic intervention in order to improve on the following deficits Impaired flexibility;Decreased strength;Decreased mobility;Pain;Increased muscle spasms;Decreased activity tolerance   Clinical Impairments Affecting Rehab Potential None   PT Frequency 1x / week   PT Duration 12 weeks   PT Treatment/Interventions Moist Heat;Therapeutic activities;Patient/family education;Passive range of motion;Therapeutic  exercise;Biofeedback;Ultrasound;Manual techniques;Neuromuscular re-education;Cryotherapy;Electrical Stimulation;Functional mobility training   PT Next Visit Plan soft tissue work   PT Home Exercise Plan current HEP   Consulted and Agree with Plan of Care Patient        Problem List Patient Active Problem List   Diagnosis Date Noted  . Hyperlipidemia, mixed 06/22/2014  . Overweight 06/22/2014  . Preventative health care 12/18/2013  . Constipation 12/18/2013  . Low back pain 12/16/2013  . Nausea alone 12/05/2013  . Tremulousness 08/14/2013  . Splenomegaly 07/18/2013  . Infectious mononucleosis 07/18/2013  . Sinusitis 05/22/2013  . Inguinal hernia 05/12/2012  . Pelvic floor dysfunction 04/28/2012  . Stress reaction 09/21/2011  . Atypical chest pain 01/06/2011  . 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 PREMATURE VENTRICULAR CONTRACTIONS, FREQUENT 04/09/2010  . PROSTATITIS, CHRONIC 04/09/2010  . ELEVATED BLOOD PRESSURE WITHOUT DIAGNOSIS OF HYPERTENSION 04/09/2010  . PFD (pelvic floor dysfunction) 04/09/2010    Johnanna Bakke,PT 08/02/2014, 10:58 AM  [facility] [address], [place], [place], [address] Phone: [phone]   Fax:  [phone]

## 2014-08-16 ENCOUNTER — Encounter: Payer: Self-pay | Admitting: Physical Therapy

## 2014-08-16 ENCOUNTER — Ambulatory Visit: Payer: Federal, State, Local not specified - PPO | Attending: Family Medicine | Admitting: Physical Therapy

## 2014-08-16 DIAGNOSIS — M62838 Other muscle spasm: Secondary | ICD-10-CM | POA: Insufficient documentation

## 2014-08-16 DIAGNOSIS — M25552 Pain in left hip: Secondary | ICD-10-CM | POA: Diagnosis present

## 2014-08-16 NOTE — Patient Instructions (Signed)
  PNF Strengthening: Resisted   Standing with resistive band around each hand, bring right arm up and away, thumb back. Repeat _10___ times per set. Do _2___ sets per session. Do _1-2___ sessions per day.      Resisted Horizontal Abduction: Bilateral   Sit or stand, tubing in both hands, arms out in front. Keeping arms straight, pinch shoulder blades together and stretch arms out. Repeat _10___ times per set. Do 2____ sets per session. Do _1-2___ sessions per day.                  Scapular Retraction: Elbow Flexion (Standing)   With elbows bent to 90, pinch shoulder blades together and rotate arms out, keeping elbows bent. Use greenband.  Repeat _10___ times per set. Do _1___ sets per session. Do many__1__ sessions per day.    Strengthening: Resisted Extension   Hold tubing in right hand, arm forward. Pull arm back, elbow straight. Repeat _10___ times per set. Do _2___ sets per session. Do _1-2___ sessions per day.  Can place band around the front of your body and perform with both arms at the same time.  Unsupported Pelvic Tilt   Gently rock pelvis forward and backward. Do _1__ sets of _10__ repetitions.  Copyright  VHI. All rights reserved.  Unsupported Lateral Pelvic Tilt   Gently move hips from side to side. Do __1_ sets of _10__ repetitions.  Copyright  VHI. All rights reserved.  Unsupported Pelvic Circle   Gently rotate pelvis clockwise _10__ times, then counterclockwise __10_ times. Do _1__ sets of _1__ repetitions.  Copyright  VHI. All rights reserved.  Bridge With [employer]ocoPhillipsKnee Bend   Lie on back, both feet on sit fit, knees slightly flexed, arms on floor. Tighten buttocks while keeping abdominals tight and raise hips _6__ inches off floor. Do _15__ sets of _1__ repetitions. Advanced: Do not use arms for support. Then can do one leg 10 times Copyright  VHI. All rights reserved.  One-Leg Bridge With Knee BadinBend   Lie on back, one leg on  top of ball, knee slightly flexed, other leg lifted toward ceiling, arms on floor. Tighten buttocks, keeping abdominals tight and raise hips _6__ inches off floor. Do 10___ sets of _1__ repetitions. Advanced: Do not use arms for support.  Copyright  VHI. All rights reserved.  [facility] [address], [address] [place], [place] [address] Phone # [phone] Fax [phone]

## 2014-08-16 NOTE — Therapy (Signed)
[facility] [address], [address] [place], [place], [address] Phone: [phone]   Fax:  [phone]  Physical Therapy Treatment  Patient Details  Name: Jared Myers MRN: [id number] Date of Birth: [birth year] Referring Provider:  Bradd Canary, MD  Encounter Date: 08/16/2014      PT End of Session - 08/16/14 1022    Visit Number 8   Date for PT Re-Evaluation 09/21/14   PT Start Time 1015   PT Stop Time 1100   PT Time Calculation (min) 45 min   Activity Tolerance Patient tolerated treatment well   Behavior During Therapy [facility] for tasks assessed/performed      Past Medical History  Diagnosis Date  . Premature ventricular beat   . Pelvic floor dysfunction   . Acute upper respiratory infections of unspecified site 04/28/2012  . 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 Inguinal hernia 05/12/2012  . Splenomegaly 07/18/2013  . Infectious mononucleosis 07/18/2013  . Tremulousness 08/14/2013  . Low back pain 12/16/2013  . Preventative health care 12/18/2013  . Constipation 12/18/2013  . Hyperlipidemia, mixed 06/22/2014  . Overweight 06/22/2014    History reviewed. No pertinent past surgical history.  There were no vitals filed for this visit.  Visit Diagnosis:  Muscle spasm  Pain in joint, pelvic region and thigh, left      Subjective Assessment - 08/16/14 1022    Subjective I was able to dig for the first time in the yard in 2 years with no increase in pain. I used good body mechanics. I have seen a chiropractor to be adjusted.    Limitations Standing   How long can you sit comfortably? 3 hours.    How long can you stand comfortably? pain level 4/10   How long can you walk comfortably? 3-4 hours   Patient Stated Goals reduce pain, learn ways to manage pain   Currently in Pain? Yes   Pain Score 5   2-3/10 twinge in pelvic   Pain Location Back   Pain Orientation Left   Pain Descriptors / Indicators Throbbing   twinge with pelvic   Pain Type Chronic pain   Pain Onset More than a month ago   Pain Frequency Intermittent   Aggravating Factors  sitting   Pain Relieving Factors stretches and soft tissue work   Multiple Pain Sites No                         [facility] Adult PT Treatment/Exercise - 08/16/14 0001    Lumbar Exercises: Seated   Other Seated Lumbar Exercises sit on sit fit alternate hip flexion, pelvic sway, pelvic circles, pelvic tilt   Other Seated Lumbar Exercises sit on sit fit horizontal abd and diagonals with shoulder 15 x each                PT Education - 08/16/14 1028    Education Details sit on a U shaped towel; gave patient informaiton on sitting on sit fit prickly side; stability exercises on sit fit   Person(s) Educated Patient   Methods Explanation   Comprehension Verbalized understanding;Returned demonstration          PT Short Term Goals - 06/21/14 1102    PT SHORT TERM GOAL #5   Time 4   Period Weeks   Status Achieved           PT Long Term Goals - 08/16/14 1056  PT LONG TERM GOAL #2   Title be independent with advanced HEP for pelvic floor strengthening   Time 12   Period Weeks   Status On-going  continues to learn exercises   PT LONG TERM GOAL #3   Title pain after ejaculation decreased >/= 75%   Time 12   Period Weeks   Status On-going  still has pain   PT LONG TERM GOAL #4   Title pain with squatting decreased >/= 75%   Time 12   Period Weeks   Status On-going  still has pain   PT LONG TERM GOAL #5   Title constipation reduced >/=60%   Time 12   Period Weeks   Status Achieved   PT LONG TERM GOAL #6   Title pain with home activiies decresaed >/= 75%   Time 12   Period Weeks   Status Achieved   PT LONG TERM GOAL #7   Title return to roller skating with minimal pain   Time 12   Period Weeks   Status Achieved   PT LONG TERM GOAL #8   Title pain with urination decreased >/= 60%   Time 12   Period Weeks    Status Achieved               Plan - 08/16/14 1057    Clinical Impression Statement Patient has decreased pain this week compared to last visit.  Patient was able to dig in the yard without increase in pelvic pain for first time.  Patient is concerned about taking a new position due to sitting in a mail truck and the back and forth movement of the trunk. PT worked with patient on ways to sit on a towel roll and sit fit to manage his pain and it helped while in therapy. Patient will be purchasing a sit fit.    Pt will benefit from skilled therapeutic intervention in order to improve on the following deficits Impaired flexibility;Decreased strength;Decreased mobility;Pain;Increased muscle spasms;Decreased activity tolerance   Clinical Impairments Affecting Rehab Potential None   PT Frequency 1x / week   PT Duration 12 weeks   PT Treatment/Interventions Moist Heat;Therapeutic activities;Patient/family education;Passive range of motion;Therapeutic exercise;Biofeedback;Ultrasound;Manual techniques;Neuromuscular re-education;Cryotherapy;Electrical Stimulation;Functional mobility training   PT Next Visit Plan soft tissue work if needed, core strengthening   PT Home Exercise Plan current HEP   Consulted and Agree with Plan of Care Patient        Problem List Patient Active Problem List   Diagnosis Date Noted  . Hyperlipidemia, mixed 06/22/2014  . Overweight 06/22/2014  . Preventative health care 12/18/2013  . Constipation 12/18/2013  . Low back pain 12/16/2013  . Nausea alone 12/05/2013  . Tremulousness 08/14/2013  . Splenomegaly 07/18/2013  . Infectious mononucleosis 07/18/2013  . Sinusitis 05/22/2013  . Inguinal hernia 05/12/2012  . Pelvic floor dysfunction 04/28/2012  . Stress reaction 09/21/2011  . Atypical chest pain 01/06/2011  . 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 PREMATURE VENTRICULAR CONTRACTIONS, FREQUENT 04/09/2010  . PROSTATITIS, CHRONIC 04/09/2010  .  ELEVATED BLOOD PRESSURE WITHOUT DIAGNOSIS OF HYPERTENSION 04/09/2010  . PFD (pelvic floor dysfunction) 04/09/2010    Reason Helzer,PT 08/16/2014, 11:00 AM  [facility] [address], [address] [place], [place], [address] Phone: [phone]   Fax:  [phone]

## 2014-08-30 ENCOUNTER — Encounter: Payer: Self-pay | Admitting: Physical Therapy

## 2014-08-30 ENCOUNTER — Ambulatory Visit: Payer: Federal, State, Local not specified - PPO | Admitting: Physical Therapy

## 2014-08-30 DIAGNOSIS — M62838 Other muscle spasm: Secondary | ICD-10-CM | POA: Diagnosis not present

## 2014-08-30 DIAGNOSIS — M25552 Pain in left hip: Secondary | ICD-10-CM

## 2014-08-30 NOTE — Patient Instructions (Addendum)
Strengthening: Hip Abduction (Side-Lying)   Tighten muscles on front of left thigh, then lift leg _6___ inches from surface, keeping knee locked.  Repeat _15___ times per set. Do _1___ sets per session. Do _1___ sessions per day. As you get stronger work on increasing repetitions.  http://orth.exer.us/622   Copyright  VHI. All rights reserved.  Strengthening: Hip Extension (Prone)   Tighten muscles on front of left thigh,left knee bent,  then lift leg _2___ inches from surface, do not let left knee go out to the side close to right leg Repeat __10__ times per set. Do __1__ sets per session. Do __1__ sessions per day.  http://orth.exer.us/620   Copyright  VHI. All rights reserved.  [facility] [address] [place], [place] [address] Phone # [phone] Fax [phone]

## 2014-08-30 NOTE — Therapy (Signed)
[facility] [address], [place], [place], [address] Phone: [phone]   Fax:  [phone]  Physical Therapy Treatment  Patient Details  Name: Jared Myers MRN: [id number] Date of Birth: 05/04/1980 Referring Provider:  Mosie Lukes, MD  Encounter Date: 08/30/2014      PT End of Session - 08/30/14 1056    Visit Number 9   Date for PT Re-Evaluation 09/21/14   PT Start Time 2376   PT Stop Time 1056   PT Time Calculation (min) 41 min   Activity Tolerance Patient tolerated treatment well   Behavior During Therapy [facility] for tasks assessed/performed      Past Medical History  Diagnosis Date  . Premature ventricular beat   . Pelvic floor dysfunction   . Acute upper respiratory infections of unspecified site 04/28/2012  . 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 Inguinal hernia 05/12/2012  . Splenomegaly 07/18/2013  . Infectious mononucleosis 07/18/2013  . Tremulousness 08/14/2013  . Low back pain 12/16/2013  . Preventative health care 12/18/2013  . Constipation 12/18/2013  . Hyperlipidemia, mixed 06/22/2014  . Overweight 06/22/2014    History reviewed. No pertinent past surgical history.  There were no vitals filed for this visit.  Visit Diagnosis:  Muscle spasm  Pain in joint, pelvic region and thigh, left      Subjective Assessment - 08/30/14 1020    Subjective I went to the chiropractor and I am sore. I feel good with the pelvic floor and 2 days I did not have to take pain medication. Sitting is 70% better. I am having more good days than bad. I have pain when I walk from my work area then to the bathroom. I have not been using my heel lift.    Limitations Standing   How long can you sit comfortably? 3 hours.    How long can you stand comfortably? pain level 4/10   How long can you walk comfortably? 3-4 hours   Patient Stated Goals reduce pain, learn ways to manage pain   Currently in Pain? Yes   Pain Score 5    Pain Location Back  right lower abdomen   Pain Orientation Left   Pain Descriptors / Indicators Sharp   Pain Type Chronic pain   Pain Onset More than a month ago   Pain Frequency Intermittent   Aggravating Factors  walking to rest room at work   Pain Relieving Factors stretches and soft tissue work   Multiple Pain Sites No            [facility] PT Assessment - 08/30/14 0001    ROM / Strength   AROM / PROM / Strength Strength   Strength   Overall Strength Comments left hip abd and extension 4+/5                     [facility] Adult PT Treatment/Exercise - 08/30/14 0001    Knee/Hip Exercises: Prone   Hip Extension Strengthening;Left;3 sets;15 reps  therpist working on gluteal contraction    Manual Therapy   Manual Therapy Passive ROM   Myofascial Release left anterior left hip adductor and obturator internist   Passive ROM prone hip ER  moving through restrictions with therapist putting deep pressure into left lower abdominal                PT Education - 08/30/14 1055    Education provided Yes   Education Details hip  abduction and extension    Person(s) Educated Patient   Methods Explanation;Demonstration;Verbal cues;Handout;Tactile cues   Comprehension Verbalized understanding;Returned demonstration          PT Short Term Goals - 06/21/14 1102    PT SHORT TERM GOAL #5   Time 4   Period Weeks   Status Achieved           PT Long Term Goals - 08/30/14 1057    PT LONG TERM GOAL #2   Title be independent with advanced HEP for pelvic floor strengthening   Time 12   Period Weeks   Status On-going  continues to learn exercises   PT LONG TERM GOAL #3   Title pain after ejaculation decreased >/= 75%   Time 12   Period Weeks   Status On-going  has pain afterwards and next day               Plan - 08/30/14 1058    Clinical Impression Statement Patient reports he continues to have pain after ejaculation and the next day.  Patient has not met goals due to pain with ejaculation and still learning exercises.  Patient has difficulty with hip extension while contracting the gluteals.  the left hip flexors were tight but afterwards had increased length.  Patient has tight hip ER with trigger points in anterior lower abdominal area.  After restrictions were released there was a reduction of pain.  Patient left hi pabdution and extension strength is 4+/5.    Pt will benefit from skilled therapeutic intervention in order to improve on the following deficits Impaired flexibility;Decreased strength;Decreased mobility;Pain;Increased muscle spasms;Decreased activity tolerance   Clinical Impairments Affecting Rehab Potential None   PT Frequency 1x / week   PT Duration 12 weeks   PT Treatment/Interventions Moist Heat;Therapeutic activities;Patient/family education;Passive range of motion;Therapeutic exercise;Biofeedback;Ultrasound;Manual techniques;Neuromuscular re-education;Cryotherapy;Electrical Stimulation;Functional mobility training   PT Next Visit Plan work on pain with ejaculation   PT Home Exercise Plan current HEP   Consulted and Agree with Plan of Care Patient        Problem List Patient Active Problem List   Diagnosis Date Noted  . Hyperlipidemia, mixed 06/22/2014  . Overweight 06/22/2014  . Preventative health care 12/18/2013  . Constipation 12/18/2013  . Low back pain 12/16/2013  . Nausea alone 12/05/2013  . Tremulousness 08/14/2013  . Splenomegaly 07/18/2013  . Infectious mononucleosis 07/18/2013  . Sinusitis 05/22/2013  . Inguinal hernia 05/12/2012  . Pelvic floor dysfunction 04/28/2012  . Stress reaction 09/21/2011  . Atypical chest pain 01/06/2011  . 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 PREMATURE VENTRICULAR CONTRACTIONS, FREQUENT 04/09/2010  . PROSTATITIS, CHRONIC 04/09/2010  . ELEVATED BLOOD PRESSURE WITHOUT DIAGNOSIS OF HYPERTENSION 04/09/2010  . PFD (pelvic floor dysfunction)  04/09/2010    GRAY,CHERYL,PT 08/30/2014, 11:02 AM  [facility] [address] [place], [place], [address] Phone: [phone]   Fax:  [phone]

## 2014-09-20 ENCOUNTER — Ambulatory Visit: Payer: Federal, State, Local not specified - PPO | Attending: Family Medicine | Admitting: Physical Therapy

## 2014-09-20 ENCOUNTER — Encounter: Payer: Self-pay | Admitting: Physical Therapy

## 2014-09-20 DIAGNOSIS — M62838 Other muscle spasm: Secondary | ICD-10-CM | POA: Insufficient documentation

## 2014-09-20 DIAGNOSIS — M25552 Pain in left hip: Secondary | ICD-10-CM | POA: Insufficient documentation

## 2014-09-20 NOTE — Therapy (Signed)
[facility] [address], [place], [place], [address] Phone: [phone]   Fax:  [phone]  Physical Therapy Treatment  Patient Details  Name: Jared Myers MRN: [id number] Date of Birth: 06-18-1980 Referring Provider:  Mosie Lukes, MD  Encounter Date: 09/20/2014      PT End of Session - 09/20/14 0944    Visit Number 10   Date for PT Re-Evaluation 12/13/14   PT Start Time 0930   PT Stop Time 1010   PT Time Calculation (min) 40 min   Activity Tolerance Patient tolerated treatment well   Behavior During Therapy [facility] for tasks assessed/performed      Past Medical History  Diagnosis Date  . Premature ventricular beat   . Pelvic floor dysfunction   . Acute upper respiratory infections of unspecified site 04/28/2012  . 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 Inguinal hernia 05/12/2012  . Splenomegaly 07/18/2013  . Infectious mononucleosis 07/18/2013  . Tremulousness 08/14/2013  . Low back pain 12/16/2013  . Preventative health care 12/18/2013  . Constipation 12/18/2013  . Hyperlipidemia, mixed 06/22/2014  . Overweight 06/22/2014    History reviewed. No pertinent past surgical history.  There were no vitals filed for this visit.  Visit Diagnosis:  Muscle spasm - Plan: PT plan of care cert/re-cert  Pain in joint, pelvic region and thigh, left - Plan: PT plan of care cert/re-cert      Subjective Assessment - 09/20/14 0936    Subjective I am just a little sore today but I have been more active. I have been to the skating rink. At work I am alternating my position.    How long can you sit comfortably? improved   How long can you stand comfortably? no difficulty    How long can you walk comfortably? walking down hallway to urinate   Patient Stated Goals reduce pain, learn ways to manage pain   Currently in Pain? Yes   Pain Score 5    Pain Location Pelvis  back   Pain Orientation Right;Left;Lower    Pain Descriptors / Indicators Sore   Pain Type Chronic pain   Pain Onset More than a month ago   Pain Frequency Intermittent   Aggravating Factors  not sure   Pain Relieving Factors stretches and take a 6 min break   Multiple Pain Sites No            [facility] PT Assessment - 09/20/14 0001    Assessment   Medical Diagnosis N81.84 pelvic floor dysfunction   Precautions   Precautions None   Balance Screen   Has the patient fallen in the past 6 months No   Has the patient had a decrease in activity level because of a fear of falling?  No   Is the patient reluctant to leave their home because of a fear of falling?  No   Prior Function   Level of Independence Independent with basic ADLs   Strength   Overall Strength Comments left hip abduction 4+/5 and extension 5/5                     [facility] Adult PT Treatment/Exercise - 09/20/14 0001    Manual Therapy   Manual Therapy Soft tissue mobilization   Soft tissue mobilization tissue rolling of left thigh, left inner groin   Myofascial Release to left ishiocavernosus and bulbocavernosus and obturator internist  PT Education - 09/20/14 1013    Education provided Yes   Education Details educated patient on tissue rolling of the thighs and pelvic floor, educated patient on how to make a seat cushion to sit on    Person(s) Educated Patient   Methods Explanation;Demonstration;Tactile cues;Verbal cues   Comprehension Returned demonstration;Verbalized understanding          PT Short Term Goals - 06/21/14 1102    PT SHORT TERM GOAL #5   Time 4   Period Weeks   Status Achieved           PT Long Term Goals - 09/20/14 9038    PT LONG TERM GOAL #3   Title pain after ejaculation decreased >/= 75%   Time 12   Period Weeks   Status On-going  No change   PT LONG TERM GOAL #4   Title pain with squatting decreased >/= 75%   Time 12   Period Weeks   Status On-going  60%                Plan - 09/20/14 1015    Clinical Impression Statement Patient is a 34 year old male with pelvic pain.  Patient has met all of his STG's.  Patient has met all of LTG's with exception of pain with ejaculation and squating with pain.  Patient can squat with pain decreased by 60% and no change with pain with ejaculation.  Lef thip abduction is 4+/5 and hip extension 5/5. Patient does not have pain with urination.  Patient has tight soft tissue in the pelvic floor and thigh musculature.  Patient has learned techniques to do at home to manage his pain. Patient will benefit from therapy to reduce pelvic pain with ejaculation and squatting.     Pt will benefit from skilled therapeutic intervention in order to improve on the following deficits Impaired flexibility;Decreased strength;Decreased mobility;Pain;Increased muscle spasms;Decreased activity tolerance   Rehab Potential Good   Clinical Impairments Affecting Rehab Potential None   PT Frequency 1x / week   PT Duration 12 weeks   PT Treatment/Interventions Moist Heat;Therapeutic activities;Patient/family education;Passive range of motion;Therapeutic exercise;Biofeedback;Ultrasound;Manual techniques;Neuromuscular re-education;Cryotherapy;Electrical Stimulation;Functional mobility training   PT Next Visit Plan tissue rolling   PT Home Exercise Plan progress as needed   Consulted and Agree with Plan of Care Patient        Problem List Patient Active Problem List   Diagnosis Date Noted  . Hyperlipidemia, mixed 06/22/2014  . Overweight 06/22/2014  . Preventative health care 12/18/2013  . Constipation 12/18/2013  . Low back pain 12/16/2013  . Nausea alone 12/05/2013  . Tremulousness 08/14/2013  . Splenomegaly 07/18/2013  . Infectious mononucleosis 07/18/2013  . Sinusitis 05/22/2013  . Inguinal hernia 05/12/2012  . Pelvic floor dysfunction 04/28/2012  . Stress reaction 09/21/2011  . Atypical chest pain 01/06/2011  . Anxiety in acute stress  reaction 01/06/2011  . PREMATURE VENTRICULAR CONTRACTIONS, FREQUENT 04/09/2010  . PROSTATITIS, CHRONIC 04/09/2010  . ELEVATED BLOOD PRESSURE WITHOUT DIAGNOSIS OF HYPERTENSION 04/09/2010  . PFD (pelvic floor dysfunction) 04/09/2010    [name],PT 09/20/2014, 10:20 AM  [facility] [address], [place], [place], [address] Phone: [phone]   Fax:  [phone]

## 2014-09-28 ENCOUNTER — Ambulatory Visit (INDEPENDENT_AMBULATORY_CARE_PROVIDER_SITE_OTHER): Payer: Federal, State, Local not specified - PPO | Admitting: Family Medicine

## 2014-09-28 ENCOUNTER — Encounter: Payer: Self-pay | Admitting: Family Medicine

## 2014-09-28 VITALS — BP 128/88 | HR 74 | Temp 98.0°F | Ht 68.0 in | Wt 183.4 lb

## 2014-09-28 DIAGNOSIS — R739 Hyperglycemia, unspecified: Secondary | ICD-10-CM | POA: Diagnosis not present

## 2014-09-28 DIAGNOSIS — K599 Functional intestinal disorder, unspecified: Secondary | ICD-10-CM | POA: Diagnosis not present

## 2014-09-28 DIAGNOSIS — E663 Overweight: Secondary | ICD-10-CM

## 2014-09-28 DIAGNOSIS — K5900 Constipation, unspecified: Secondary | ICD-10-CM | POA: Diagnosis not present

## 2014-09-28 DIAGNOSIS — E782 Mixed hyperlipidemia: Secondary | ICD-10-CM | POA: Diagnosis not present

## 2014-09-28 DIAGNOSIS — M6289 Other specified disorders of muscle: Secondary | ICD-10-CM

## 2014-09-28 DIAGNOSIS — M545 Low back pain: Secondary | ICD-10-CM

## 2014-09-28 DIAGNOSIS — R030 Elevated blood-pressure reading, without diagnosis of hypertension: Secondary | ICD-10-CM | POA: Diagnosis not present

## 2014-09-28 HISTORY — DX: Hyperglycemia, unspecified: R73.9

## 2014-09-28 LAB — COMPREHENSIVE METABOLIC PANEL
ALT: 17 U/L (ref 0–53)
AST: 16 U/L (ref 0–37)
Albumin: 4.6 g/dL (ref 3.5–5.2)
Alkaline Phosphatase: 66 U/L (ref 39–117)
BUN: 11 mg/dL (ref 6–23)
CO2: 30 meq/L (ref 19–32)
CREATININE: 0.96 mg/dL (ref 0.40–1.50)
Calcium: 9.6 mg/dL (ref 8.4–10.5)
Chloride: 102 mEq/L (ref 96–112)
GFR: 95.03 mL/min (ref >60.00)
Glucose, Bld: 83 mg/dL (ref 70–99)
Potassium: 3.9 mEq/L (ref 3.5–5.1)
Sodium: 139 mEq/L (ref 135–145)
TOTAL PROTEIN: 7.3 g/dL (ref 6.0–8.3)
Total Bilirubin: 0.6 mg/dL (ref 0.2–1.2)

## 2014-09-28 LAB — CBC
HEMATOCRIT: 45.1 % (ref 39.0–52.0)
HEMOGLOBIN: 15.4 g/dL (ref 13.0–17.0)
MCHC: 34.2 g/dL (ref 30.0–36.0)
MCV: 88.1 fl (ref 78.0–100.0)
Platelets: 251 10*3/uL (ref 150.0–400.0)
RBC: 5.12 Mil/uL (ref 4.22–5.81)
RDW: 13.4 % (ref 11.5–15.5)
WBC: 6.8 10*3/uL (ref 4.0–10.5)

## 2014-09-28 LAB — LIPID PANEL
Cholesterol: 182 mg/dL (ref 0–200)
HDL: 40.8 mg/dL (ref >39.00)
LDL CALC: 105 mg/dL — AB (ref 0–99)
NonHDL: 141.2
Total CHOL/HDL Ratio: 4
Triglycerides: 182 mg/dL — ABNORMAL HIGH (ref 0.0–149.0)
VLDL: 36.4 mg/dL (ref 0.0–40.0)

## 2014-09-28 LAB — HEMOGLOBIN A1C: HEMOGLOBIN A1C: 4.9 % (ref 4.6–6.5)

## 2014-09-28 LAB — TSH: TSH: 1.43 u[IU]/mL (ref 0.35–4.50)

## 2014-09-28 MED ORDER — CARISOPRODOL 350 MG PO TABS: 350.0000 mg | ORAL_TABLET | Freq: Three times a day (TID) | ORAL | Status: DC | PRN

## 2014-09-28 NOTE — Assessment & Plan Note (Signed)
Continues with Physical therapy with Nanine Means in [place] and that is helping some. Needs to continue to do therapy and stay as active as possible

## 2014-09-28 NOTE — Assessment & Plan Note (Signed)
Well controlled. Encouraged heart healthy diet such as the DASH diet and exercise as tolerated.  

## 2014-09-28 NOTE — Assessment & Plan Note (Addendum)
Encouraged DASH diet, decrease po intake and increase exercise as tolerated. Needs 7-8 hours of sleep nightly. Avoid trans fats, eat small, frequent meals every 4-5 hours with lean proteins, complex carbs and healthy fats. Minimize simple carbs. Encouraged to increase exercise and add back his skating to help with his weight and general health

## 2014-09-28 NOTE — Progress Notes (Signed)
Jared Myers  [id number] 22-Feb-1981 09/28/2014      Progress Note-Follow Up  Subjective  Chief Complaint  Chief Complaint  Patient presents with  . Follow-up    3 months-pt states doing good    HPI  Patient is a 34 y.o. male in today for routine medical care. Patient in for follow-up. generally doing well. Has doing physical therapy  Does note with increashis abdominal back and leg pains to worsen again. No incontinence or new symptoms. No fevers or chills. Denies CP/palp/SOB/HA/congestion/fevers/GI or GU c/o. Taking meds as prescribed  Past Medical History  Diagnosis Date  . Premature ventricular beat   . Pelvic floor dysfunction   . Acute upper respiratory infections of unspecified site 04/28/2012  . 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 Inguinal hernia 05/12/2012  . Splenomegaly 07/18/2013  . Infectious mononucleosis 07/18/2013  . Tremulousness 08/14/2013  . Low back pain 12/16/2013  . Preventative health care 12/18/2013  . Constipation 12/18/2013  . Hyperlipidemia, mixed 06/22/2014  . Overweight 06/22/2014    History reviewed. No pertinent past surgical history.  Family History  Problem Relation Age of Onset  . Cancer Mother     Non Hodkins lymphoma, breast cancer  . Other Father     pituatary adenoma  . Cancer Paternal Uncle     recurrent prostate   . Heart disease Maternal Grandmother     chf  . Heart disease Maternal Grandfather     chf  . Heart disease Paternal Grandfather     chf  . Anemia Sister     History   Social History  . Marital Status: Divorced    Spouse Name: N/A  . Number of Children: N/A  . Years of Education: N/A   Occupational History  . Not on file.   Social History Main Topics  . Smoking status: Never Smoker   . Smokeless tobacco: Not on file  . Alcohol Use: No  . Drug Use: No  . Sexual Activity: No     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, works at [employer], 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   Other Topics Concern    . Not on file   Social History Narrative    Current Outpatient Prescriptions on File Prior to Visit  Medication Sig Dispense Refill  . carisoprodol (SOMA) 350 MG tablet Take 1 tablet (350 mg total) by mouth 3 (three) times daily as needed for muscle spasms. 60 tablet 1   No current facility-administered medications on file prior to visit.    Allergies  Allergen Reactions  . Guaifenesin     REACTION: PVCs  . Prednisone     PVC    Review of Systems  Review of Systems  Constitutional: Negative for fever and malaise/fatigue.  HENT: Negative for congestion.   Eyes: Negative for discharge.  Respiratory: Negative for shortness of breath.   Cardiovascular: Negative for chest pain, palpitations and leg swelling.  Gastrointestinal: Positive for abdominal pain. Negative for nausea and diarrhea.  Genitourinary: Positive for frequency. Negative for dysuria.  Musculoskeletal: Positive for back pain. Negative for falls.  Skin: Negative for rash.  Neurological: Negative for loss of consciousness and headaches.  Endo/Heme/Allergies: Negative for polydipsia.  Psychiatric/Behavioral: Negative for depression and suicidal ideas. The patient is not nervous/anxious and does not have insomnia.     Objective  BP 128/88 mmHg  Pulse 74  Temp(Src) 98 F (36.7 C) (Oral)  Ht  (1.727 m)  Wt 183 lb 6.4 oz (  83.19 kg)  BMI 27.89 kg/m2  SpO2 100%  Physical Exam  Physical Exam  Constitutional: He is oriented to person, place, and time and well-developed, well-nourished, and in no distress. No distress.  HENT:  Head: Normocephalic and atraumatic.  Eyes: Conjunctivae are normal.  Neck: Neck supple. No thyromegaly present.  Cardiovascular: Normal rate, regular rhythm and normal heart sounds.   No murmur heard. Pulmonary/Chest: Effort normal and breath sounds normal. No respiratory distress.  Abdominal: He exhibits no distension and no mass. There is no tenderness.  Musculoskeletal: He  exhibits no edema.  Neurological: He is alert and oriented to person, place, and time.  Skin: Skin is warm.  Psychiatric: Memory, affect and judgment normal.    Lab Results  Component Value Date   TSH 0.993 12/16/2013   Lab Results  Component Value Date   WBC 6.4 06/06/2014   HGB 15.7 06/06/2014   HCT 43.6 06/06/2014   MCV 83.7 06/06/2014   PLT 246 06/06/2014   Lab Results  Component Value Date   CREATININE 0.95 06/06/2014   BUN 14 06/06/2014   NA 138 06/06/2014   K 3.8 06/06/2014   CL 104 06/06/2014   CO2 27 06/06/2014   Lab Results  Component Value Date   ALT 17 01/24/2014   AST 18 01/24/2014   ALKPHOS 56 01/24/2014   BILITOT 0.8 01/24/2014   Lab Results  Component Value Date   CHOL 189 12/16/2013   Lab Results  Component Value Date   HDL 42 12/16/2013   Lab Results  Component Value Date   LDLCALC 127* 12/16/2013   Lab Results  Component Value Date   TRIG 101 12/16/2013   Lab Results  Component Value Date   CHOLHDL 4.5 12/16/2013     Assessment & Plan  Hyperlipidemia, mixed Encouraged heart healthy diet, increase exercise, avoid trans fats, consider a krill oil cap daily  ELEVATED BLOOD PRESSURE WITHOUT DIAGNOSIS OF HYPERTENSION Well controlled. Encouraged heart healthy diet such as the DASH diet and exercise as tolerated.   Constipation Encouraged increased hydration and fiber in diet. Daily probiotics. If bowels not moving can use MOM 2 tbls po in 4 oz of warm prune juice by mouth every 2-3 days.   Pelvic floor dysfunction Continues with Physical therapy with Nanine Means in [place] and that is helping some. Needs to continue to do therapy and stay as active as possible  Overweight Encouraged DASH diet, decrease po intake and increase exercise as tolerated. Needs 7-8 hours of sleep nightly. Avoid trans fats, eat small, frequent meals every 4-5 hours with lean proteins, complex carbs and healthy fats. Minimize simple carbs. Encouraged to  increase exercise and add back his skating to help with his weight and general health  Low back pain Tresa Garter is helpful at work, may continue same as needed. Encouraged moist heat and gentle stretching as tolerated. May try NSAIDs and prescription meds as directed and report if symptoms worsen or seek immediate care  Hyperglycemia minimize simple carbs. Increase exercise as tolerated.

## 2014-09-28 NOTE — Assessment & Plan Note (Signed)
Encouraged increased hydration and fiber in diet. Daily probiotics. If bowels not moving can use MOM 2 tbls po in 4 oz of warm prune juice by mouth every 2-3 days.  

## 2014-09-28 NOTE — Assessment & Plan Note (Signed)
minimize simple carbs. Increase exercise as tolerated.  

## 2014-09-28 NOTE — Progress Notes (Signed)
Pre visit review using our clinic review tool, if applicable. No additional management support is needed unless otherwise documented below in the visit note. 

## 2014-09-28 NOTE — Patient Instructions (Signed)

## 2014-09-28 NOTE — Assessment & Plan Note (Signed)
Jared Myers is helpful at work, may continue same as needed. Encouraged moist heat and gentle stretching as tolerated. May try NSAIDs and prescription meds as directed and report if symptoms worsen or seek immediate care

## 2014-09-28 NOTE — Assessment & Plan Note (Signed)
Encouraged heart healthy diet, increase exercise, avoid trans fats, consider a krill oil cap daily 

## 2014-09-29 ENCOUNTER — Telehealth: Payer: Self-pay

## 2014-09-29 NOTE — Telephone Encounter (Signed)
-----   Message from Bradd Canary, MD sent at 09/28/2014  9:57 PM EDT ----- Notify normal labs except mild elevation of trigycerides, minimize simple carbs and saturated fats.

## 2014-09-29 NOTE — Telephone Encounter (Signed)
Patient returning your call best # [phone]

## 2014-09-29 NOTE — Telephone Encounter (Signed)
Pt. Notified verbalized understanding no questions at this time.

## 2014-09-29 NOTE — Telephone Encounter (Signed)
LMOVM

## 2014-10-04 ENCOUNTER — Ambulatory Visit: Payer: Federal, State, Local not specified - PPO | Admitting: Physical Therapy

## 2014-10-04 ENCOUNTER — Encounter: Payer: Self-pay | Admitting: Physical Therapy

## 2014-10-04 DIAGNOSIS — M25552 Pain in left hip: Secondary | ICD-10-CM

## 2014-10-04 DIAGNOSIS — M62838 Other muscle spasm: Secondary | ICD-10-CM | POA: Diagnosis not present

## 2014-10-04 NOTE — Therapy (Addendum)
[facility] [address] [place], [place], [address] Phone: [phone]   Fax:  [phone]  Physical Therapy Treatment  Patient Details  Name: Jared Myers MRN: [id number] Date of Birth: 08/23/1980 Referring Provider:  Mosie Lukes, MD  Encounter Date: 10/04/2014      PT End of Session - 10/04/14 1008    Visit Number 11   Date for PT Re-Evaluation 12/13/14   PT Start Time 0930   PT Stop Time 1010   PT Time Calculation (min) 40 min   Activity Tolerance Patient tolerated treatment well   Behavior During Therapy [facility] for tasks assessed/performed      Past Medical History  Diagnosis Date  . Premature ventricular beat   . Pelvic floor dysfunction   . Acute upper respiratory infections of unspecified site 04/28/2012  . 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 Inguinal hernia 05/12/2012  . Splenomegaly 07/18/2013  . Infectious mononucleosis 07/18/2013  . Tremulousness 08/14/2013  . Low back pain 12/16/2013  . Preventative health care 12/18/2013  . Constipation 12/18/2013  . Hyperlipidemia, mixed 06/22/2014  . Overweight 06/22/2014  . Hyperglycemia 09/28/2014    History reviewed. No pertinent past surgical history.  There were no vitals filed for this visit.  Visit Diagnosis:  Muscle spasm  Pain in joint, pelvic region and thigh, left      Subjective Assessment - 10/04/14 0932    Subjective I saw Dr. Randel Pigg and wants me to be more active. I am going skating, I am walking. I have a treadmill.    How long can you walk comfortably? walking down hallway to urinate   Patient Stated Goals reduce pain, learn ways to manage pain   Currently in Pain? Yes   Pain Score 7    Pain Location Back   Pain Orientation Right;Left   Pain Descriptors / Indicators Sore;Tightness   Pain Type Chronic pain   Pain Onset More than a month ago   Pain Frequency Intermittent   Aggravating Factors  Not sure   Pain Relieving  Factors stretches   Multiple Pain Sites No                         [facility] Adult PT Treatment/Exercise - 10/04/14 0001    Manual Therapy   Manual Therapy Soft tissue mobilization   Soft tissue mobilization bil. levator ani, obturator internist, transverse , psoas with hip movements                PT Education - 10/04/14 1008    Education provided Yes   Education Details website on Physio yoga for pelvic floor yoga; ways to incorporate cardio into his program   Person(s) Educated Patient   Methods Explanation;Verbal cues   Comprehension Verbalized understanding          PT Short Term Goals - 06/21/14 1102    PT SHORT TERM GOAL #5   Time 4   Period Weeks   Status Achieved           PT Long Term Goals - 10/04/14 0930    PT LONG TERM GOAL #2   Title be independent with advanced HEP for pelvic floor strengthening   Time 12   Period Weeks   Status On-going  still learning   PT LONG TERM GOAL #3   Title pain after ejaculation decreased >/= 75%   Time 12   Period Weeks   Status  On-going  decreased by 50%   PT LONG TERM GOAL #4   Title pain with squatting decreased >/= 75%   Time 12   Period Weeks   Status On-going  60% decreased               Plan - 10/04/14 1008    Clinical Impression Statement Patient had more tightness on right side of pelvic floor muscles compared to left.  Patient pain after ejaculation has decreased by 50% with dull ache compared to sharp pain.  Patient pain with squatting has decreased by 60% .  Discussed with patient on ways to incorporate cardio into his program but no bike riding due to pressure on pelvic floor. Patient continues to benefit from  physical therapy to improve mobility of muscles.     Pt will benefit from skilled therapeutic intervention in order to improve on the following deficits Impaired flexibility;Decreased strength;Decreased mobility;Pain;Increased muscle spasms;Decreased activity tolerance    Rehab Potential Good   Clinical Impairments Affecting Rehab Potential None   PT Frequency 1x / week   PT Duration 12 weeks   PT Treatment/Interventions Moist Heat;Therapeutic activities;Patient/family education;Passive range of motion;Therapeutic exercise;Biofeedback;Ultrasound;Manual techniques;Neuromuscular re-education;Cryotherapy;Electrical Stimulation;Functional mobility training   PT Next Visit Plan soft tissue work and yoga   PT Home Exercise Plan progress as needed   Consulted and Agree with Plan of Care Patient        Problem List Patient Active Problem List   Diagnosis Date Noted  . Hyperglycemia 09/28/2014  . Hyperlipidemia, mixed 06/22/2014  . Overweight 06/22/2014  . Preventative health care 12/18/2013  . Constipation 12/18/2013  . Low back pain 12/16/2013  . Nausea alone 12/05/2013  . Tremulousness 08/14/2013  . Splenomegaly 07/18/2013  . Infectious mononucleosis 07/18/2013  . Sinusitis 05/22/2013  . Inguinal hernia 05/12/2012  . Pelvic floor dysfunction 04/28/2012  . Stress reaction 09/21/2011  . Atypical chest pain 01/06/2011  . 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 PREMATURE VENTRICULAR CONTRACTIONS, FREQUENT 04/09/2010  . PROSTATITIS, CHRONIC 04/09/2010  . ELEVATED BLOOD PRESSURE WITHOUT DIAGNOSIS OF HYPERTENSION 04/09/2010  . PFD (pelvic floor dysfunction) 04/09/2010    GRAY,CHERYL,PT 10/04/2014, 10:13 AM   [facility] [address] [place], [place], [address] Phone: [phone]   Fax:  [phone]     PHYSICAL THERAPY DISCHARGE SUMMARY  Visits from Start of Care: 11  Current functional level related to goals / functional outcomes: See above.  Patient called to cancel appointment on 11/21/2014 and has not scheduled further visits since then.    Remaining deficits: See above.  Unable to assess patient for discharge due to not returning.   Education /  Equipment: HEP Plan: Patient agrees to discharge.  Patient goals were partially met. Patient is being discharged due to not returning since the last visit.  Thank you for the referral. Earlie Counts, PT 01/10/2015 2:37 PM  ?????

## 2014-10-05 ENCOUNTER — Telehealth: Payer: Self-pay | Admitting: Family Medicine

## 2014-10-05 NOTE — Telephone Encounter (Signed)
Caller name:Garrick Jakubiak Relationship to patient:self Can be reached:[phone] Pharmacy:  Reason for call:Having lip and cheek numbness, ongoing since Monday. Is this due to med soma?

## 2014-10-05 NOTE — Telephone Encounter (Signed)
Notified patient per Dr. Abner Greenspan that this was not a common side effect of Soma unless he has facial swelling (denies) and could be caused by a virus.  He states it feels like someone is tickling his face with a cotton ball.  Notified patient that if symptoms worsen or he experiences tingling in arms or legs, severe headache, facial droop, speech slurring to go to ED.  He stated understanding.  He states he will call to schedule an appointment if symptoms do not improve over the next week.

## 2014-10-12 ENCOUNTER — Telehealth: Payer: Self-pay | Admitting: *Deleted

## 2014-10-12 NOTE — Telephone Encounter (Signed)
Pt dropped off FMLA forms needed for intermittent leave due to pelvic floor pain. Filled out as much as possible and forwarded to Dr. Abner Greenspan. JG//CMA

## 2014-10-17 NOTE — Telephone Encounter (Signed)
Faxed to [employer] at [phone] successfully. [employer] placed up front for pt pick up, lm for pt. Copy sent for scanning. JG//CMA

## 2014-10-25 ENCOUNTER — Encounter: Payer: Federal, State, Local not specified - PPO | Admitting: Physical Therapy

## 2014-11-07 ENCOUNTER — Other Ambulatory Visit: Payer: Self-pay | Admitting: Family Medicine

## 2014-11-07 MED ORDER — CARISOPRODOL 350 MG PO TABS: 350.0000 mg | ORAL_TABLET | Freq: Three times a day (TID) | ORAL | Status: DC | PRN

## 2014-11-07 NOTE — Telephone Encounter (Signed)
Requesting: Soma Contract  12/16/13 UDS  none Last OV   09/28/14 Last Refill     09/28/14   #[phone] with 1 refill  Please Advise

## 2014-11-07 NOTE — Telephone Encounter (Signed)
Faxed hardcopy for [employer] to [employer]

## 2014-11-07 NOTE — Telephone Encounter (Signed)
Caller name: Deklin Bieler  Relationship to patient: Self  Can be reached: [phone]  Pharmacy:  Reason for call: pt need a refill on his SOMA Rx. Pt says that he only have 3 left.

## 2014-11-08 ENCOUNTER — Encounter: Payer: Federal, State, Local not specified - PPO | Admitting: Physical Therapy

## 2014-11-22 ENCOUNTER — Encounter: Payer: Federal, State, Local not specified - PPO | Admitting: Physical Therapy

## 2014-12-06 ENCOUNTER — Encounter: Payer: Federal, State, Local not specified - PPO | Admitting: Physical Therapy

## 2014-12-20 ENCOUNTER — Encounter: Payer: Federal, State, Local not specified - PPO | Admitting: Physical Therapy

## 2015-01-02 ENCOUNTER — Encounter (INDEPENDENT_AMBULATORY_CARE_PROVIDER_SITE_OTHER): Payer: Self-pay

## 2015-01-02 ENCOUNTER — Ambulatory Visit (INDEPENDENT_AMBULATORY_CARE_PROVIDER_SITE_OTHER): Payer: Federal, State, Local not specified - PPO | Admitting: Family Medicine

## 2015-01-02 ENCOUNTER — Encounter: Payer: Self-pay | Admitting: Family Medicine

## 2015-01-02 VITALS — BP 120/84 | HR 86 | Temp 98.0°F | Ht 68.0 in | Wt 182.1 lb

## 2015-01-02 DIAGNOSIS — F430 Acute stress reaction: Secondary | ICD-10-CM

## 2015-01-02 DIAGNOSIS — M6289 Other specified disorders of muscle: Secondary | ICD-10-CM

## 2015-01-02 DIAGNOSIS — R030 Elevated blood-pressure reading, without diagnosis of hypertension: Secondary | ICD-10-CM

## 2015-01-02 DIAGNOSIS — Z23 Encounter for immunization: Secondary | ICD-10-CM | POA: Diagnosis not present

## 2015-01-02 DIAGNOSIS — F411 Generalized anxiety disorder: Secondary | ICD-10-CM

## 2015-01-02 DIAGNOSIS — K5900 Constipation, unspecified: Secondary | ICD-10-CM

## 2015-01-02 DIAGNOSIS — F419 Anxiety disorder, unspecified: Secondary | ICD-10-CM

## 2015-01-02 MED ORDER — CARISOPRODOL 350 MG PO TABS
350.0000 mg | ORAL_TABLET | Freq: Three times a day (TID) | ORAL | Status: DC | PRN
Start: 2015-01-02 — End: 2015-04-11

## 2015-01-02 NOTE — Patient Instructions (Signed)

## 2015-01-02 NOTE — Progress Notes (Signed)
Pre visit review using our clinic review tool, if applicable. No additional management support is needed unless otherwise documented below in the visit note. 

## 2015-01-14 NOTE — Progress Notes (Signed)
Subjective:    Patient ID: Jared Myers, male    DOB: 12-04-1980, 34 y.o.   MRN: [id number]  Chief Complaint  Patient presents with  . Follow-up    HPI Patient is in today for follow-up on numerous conditions. Has been following with chiropractic off-and-on as well as massage therapy and physical therapy for his pelvic floor pain. All of the above give him some relief although massage therapy has been more helpful lately. Physical therapy is confirmed some mild scoliosis and thus spasm of his muscles. No associated. Illness. Denies CP/palp/SOB/HA/congestion/fevers/GI or GU c/o. Taking meds as prescribed  Past Medical History  Diagnosis Date  . Premature ventricular beat   . Pelvic floor dysfunction   . Acute upper respiratory infections of unspecified site 04/28/2012  . 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 Inguinal hernia 05/12/2012  . Splenomegaly 07/18/2013  . Infectious mononucleosis 07/18/2013  . Tremulousness 08/14/2013  . Low back pain 12/16/2013  . Preventative health care 12/18/2013  . Constipation 12/18/2013  . Hyperlipidemia, mixed 06/22/2014  . Overweight 06/22/2014  . Hyperglycemia 09/28/2014    History reviewed. No pertinent past surgical history.  Family History  Problem Relation Age of Onset  . Cancer Mother     Non Hodkins lymphoma, breast cancer  . Other Father     pituatary adenoma  . Cancer Paternal Uncle     recurrent prostate   . Heart disease Maternal Grandmother     chf  . Heart disease Maternal Grandfather     chf  . Heart disease Paternal Grandfather     chf  . Anemia Sister     Social History   Social History  . Marital Status: Divorced    Spouse Name: N/A  . Number of Children: N/A  . Years of Education: N/A   Occupational History  . Not on file.   Social History Main Topics  . Smoking status: Never Smoker   . Smokeless tobacco: Not on file  . Alcohol Use: No  . Drug Use: No  . Sexual Activity: No     Comment: lives with  wife and parents, works at post [employer], 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   Other Topics Concern  . Not on file   Social History Narrative    Outpatient Prescriptions Prior to Visit  Medication Sig Dispense Refill  . carisoprodol (SOMA) 350 MG tablet Take 1 tablet (350 mg total) by mouth 3 (three) times daily as needed for muscle spasms. 60 tablet 1   No facility-administered medications prior to visit.    Allergies  Allergen Reactions  . Guaifenesin     REACTION: PVCs  . Prednisone     PVC    Review of Systems  Constitutional: Negative for fever and malaise/fatigue.  HENT: Negative for congestion.   Eyes: Negative for discharge.  Respiratory: Negative for shortness of breath.   Cardiovascular: Negative for chest pain, palpitations and leg swelling.  Gastrointestinal: Positive for abdominal pain. Negative for nausea.  Genitourinary: Negative for dysuria.  Musculoskeletal: Negative for falls.  Skin: Negative for rash.  Neurological: Negative for loss of consciousness and headaches.  Endo/Heme/Allergies: Negative for environmental allergies.  Psychiatric/Behavioral: Negative for depression. The patient is not nervous/anxious.        Objective:    Physical Exam  Constitutional: He is oriented to person, place, and time. He appears well-developed and well-nourished. No distress.  HENT:  Head: Normocephalic and atraumatic.  Right Ear: External ear normal.  Left Ear:  External ear normal.  Nose: Nose normal.  Mouth/Throat: Oropharynx is clear and moist. No oropharyngeal exudate.  Eyes: Conjunctivae and EOM are normal. Pupils are equal, round, and reactive to light. Right eye exhibits no discharge. Left eye exhibits no discharge.  Neck: Normal range of motion. Neck supple. No JVD present. No thyromegaly present.  Cardiovascular: Normal rate, regular rhythm and intact distal pulses.   No murmur heard. Pulmonary/Chest: Effort normal and breath sounds normal. No respiratory  distress. He has no wheezes. He has no rales. He exhibits no tenderness.  Abdominal: Soft. Bowel sounds are normal. He exhibits no distension and no mass. There is no tenderness. There is no rebound and no guarding.  Genitourinary: Prostate normal and penis normal.  Musculoskeletal: Normal range of motion. He exhibits no edema or tenderness.  Lymphadenopathy:    He has no cervical adenopathy.  Neurological: He is alert and oriented to person, place, and time. He displays normal reflexes. No cranial nerve deficit. He exhibits normal muscle tone.  Skin: Skin is warm and dry. No rash noted. He is not diaphoretic. No erythema.  Psychiatric: He has a normal mood and affect. His behavior is normal. Judgment and thought content normal.    BP 120/84 mmHg  Pulse 86  Temp(Src) 98 F (36.7 C) (Oral)  Ht 5\' 8"  (1.727 m)  Wt 182 lb 2 oz (82.611 kg)  BMI 27.70 kg/m2  SpO2 96% Wt Readings from Last 3 Encounters:  01/02/15 182 lb 2 oz (82.611 kg)  09/28/14 183 lb 6.4 oz (83.19 kg)  06/22/14 183 lb 8 oz (83.235 kg)     Lab Results  Component Value Date   WBC 6.8 09/28/2014   HGB 15.4 09/28/2014   HCT 45.1 09/28/2014   PLT 251.0 09/28/2014   GLUCOSE 83 09/28/2014   CHOL 182 09/28/2014   TRIG 182.0* 09/28/2014   HDL 40.80 09/28/2014   LDLCALC 105* 09/28/2014   ALT 17 09/28/2014   AST 16 09/28/2014   NA 139 09/28/2014   K 3.9 09/28/2014   CL 102 09/28/2014   CREATININE 0.96 09/28/2014   BUN 11 09/28/2014   CO2 30 09/28/2014   TSH 1.43 09/28/2014   PSA 0.52 01/24/2014   HGBA1C 4.9 09/28/2014    Lab Results  Component Value Date   TSH 1.43 09/28/2014   Lab Results  Component Value Date   WBC 6.8 09/28/2014   HGB 15.4 09/28/2014   HCT 45.1 09/28/2014   MCV 88.1 09/28/2014   PLT 251.0 09/28/2014   Lab Results  Component Value Date   NA 139 09/28/2014   K 3.9 09/28/2014   CO2 30 09/28/2014   GLUCOSE 83 09/28/2014   BUN 11 09/28/2014   CREATININE 0.96 09/28/2014   BILITOT  0.6 09/28/2014   ALKPHOS 66 09/28/2014   AST 16 09/28/2014   ALT 17 09/28/2014   PROT 7.3 09/28/2014   ALBUMIN 4.6 09/28/2014   CALCIUM 9.6 09/28/2014   ANIONGAP 7 06/06/2014   GFR 95.03 09/28/2014   Lab Results  Component Value Date   CHOL 182 09/28/2014   Lab Results  Component Value Date   HDL 40.80 09/28/2014   Lab Results  Component Value Date   LDLCALC 105* 09/28/2014   Lab Results  Component Value Date   TRIG 182.0* 09/28/2014   Lab Results  Component Value Date   CHOLHDL 4 09/28/2014   Lab Results  Component Value Date   HGBA1C 4.9 09/28/2014       Assessment &  Plan:   Problem List Items Addressed This Visit    PFD (pelvic floor dysfunction)    Has been following with Chiropractor, Dr Fredric Mare, will continue PT as well. Continue work place restrictions.       ELEVATED BLOOD PRESSURE WITHOUT DIAGNOSIS OF HYPERTENSION    Well controlled. Encouraged heart healthy diet such as the DASH diet and exercise as tolerated.       Constipation    Encouraged increased hydration and fiber in diet. Daily probiotics. If bowels not moving can use MOM 2 tbls po in 4 oz of warm prune juice by mouth every 2-3 days. If no results then repeat in 4 hours with  Dulcolax suppository pr, may repeat again in 4 more hours as needed. Seek care if symptoms worsen. Consider daily Miralax and/or Dulcolax if symptoms persist.       Anxiety in acute stress reaction    Doing fairly well with work stressors at this time. No changes, will monitor       Other Visit Diagnoses    Encounter for immunization    -  Primary       I am having Mr. Derner maintain his carisoprodol.  Meds ordered this encounter  Medications  . carisoprodol (SOMA) 350 MG tablet    Sig: Take 1 tablet (350 mg total) by mouth 3 (three) times daily as needed for muscle spasms.    Dispense:  60 tablet    Refill:  3     Danise Edge, MD

## 2015-01-14 NOTE — Assessment & Plan Note (Signed)
Well controlled. Encouraged heart healthy diet such as the DASH diet and exercise as tolerated.  

## 2015-01-14 NOTE — Assessment & Plan Note (Signed)
Encouraged increased hydration and fiber in diet. Daily probiotics. If bowels not moving can use MOM 2 tbls po in 4 oz of warm prune juice by mouth every 2-3 days. If no results then repeat in 4 hours with  Dulcolax suppository pr, may repeat again in 4 more hours as needed. Seek care if symptoms worsen. Consider daily Miralax and/or Dulcolax if symptoms persist.  

## 2015-01-14 NOTE — Assessment & Plan Note (Signed)
Has been following with Chiropractor, Dr Fredric MareBailey, will continue PT as well. Continue work place restrictions.

## 2015-01-14 NOTE — Assessment & Plan Note (Signed)
Doing fairly well with work stressors at this time. No changes, will monitor

## 2015-03-12 MED FILL — BENZONATATE 100 MG CAPSULE: 100 | 10 days supply | Qty: 30 | Fill #0

## 2015-03-12 MED FILL — AZITHROMYCIN 250 MG TABLET: 250 | 5 days supply | Qty: 6 | Fill #0

## 2015-04-05 ENCOUNTER — Ambulatory Visit: Payer: Federal, State, Local not specified - PPO | Admitting: Family Medicine

## 2015-04-05 MED FILL — CARISOPRODOL 350 MG TABLET: 350 | 30 days supply | Qty: 60 | Fill #1

## 2015-04-11 ENCOUNTER — Ambulatory Visit (INDEPENDENT_AMBULATORY_CARE_PROVIDER_SITE_OTHER): Payer: No Typology Code available for payment source | Admitting: Family Medicine

## 2015-04-11 ENCOUNTER — Encounter: Payer: Self-pay | Admitting: Family Medicine

## 2015-04-11 VITALS — BP 131/86 | HR 76 | Temp 98.1°F | Ht 68.0 in | Wt 184.1 lb

## 2015-04-11 DIAGNOSIS — E782 Mixed hyperlipidemia: Secondary | ICD-10-CM

## 2015-04-11 DIAGNOSIS — E663 Overweight: Secondary | ICD-10-CM

## 2015-04-11 DIAGNOSIS — M6289 Other specified disorders of muscle: Secondary | ICD-10-CM

## 2015-04-11 DIAGNOSIS — F430 Acute stress reaction: Secondary | ICD-10-CM

## 2015-04-11 DIAGNOSIS — Z0000 Encounter for general adult medical examination without abnormal findings: Secondary | ICD-10-CM | POA: Diagnosis not present

## 2015-04-11 DIAGNOSIS — R030 Elevated blood-pressure reading, without diagnosis of hypertension: Secondary | ICD-10-CM | POA: Diagnosis not present

## 2015-04-11 MED ORDER — CARISOPRODOL 350 MG PO TABS: 350.0000 mg | ORAL_TABLET | Freq: Three times a day (TID) | ORAL | Status: DC | PRN

## 2015-04-11 NOTE — Progress Notes (Signed)
Subjective:    Patient ID: Jared Myers, male    DOB: [birth year], 35 y.o.   MRN: [id number]  No chief complaint on file.   HPI Patient is in today for follow up. Is doing well. Notes a recent viral gastroenteritis with nausea, vomiting and diarrhea but his symptoms have resolved. No new concerns otherwise. Is managing his pain and stress at work with ongoing restrictions. No other acute concerns. Denies CP/palp/SOB/HA/congestion/fevers or GU c/o. Taking meds as prescribed  Past Medical History  Diagnosis Date  . Premature ventricular beat   . Pelvic floor dysfunction   . Acute upper respiratory infections of unspecified site 04/28/2012  . 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 Inguinal hernia 05/12/2012  . Splenomegaly 07/18/2013  . Infectious mononucleosis 07/18/2013  . Tremulousness 08/14/2013  . Low back pain 12/16/2013  . Preventative health care 12/18/2013  . Constipation 12/18/2013  . Hyperlipidemia, mixed 06/22/2014  . Overweight 06/22/2014  . Hyperglycemia 09/28/2014    History reviewed. No pertinent past surgical history.  Family History  Problem Relation Age of Onset  . Cancer Mother     Non Hodkins lymphoma, breast cancer  . Other Father     pituatary adenoma  . Cancer Paternal Uncle     recurrent prostate   . Heart disease Maternal Grandmother     chf  . Heart disease Maternal Grandfather     chf  . Heart disease Paternal Grandfather     chf  . Anemia Sister     Social History   Social History  . Marital Status: Divorced    Spouse Name: N/A  . Number of Children: N/A  . Years of Education: N/A   Occupational History  . Not on file.   Social History Main Topics  . Smoking status: Never Smoker   . Smokeless tobacco: Not on file  . Alcohol Use: No  . Drug Use: No  . 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 Not on file   Social History  Narrative    Outpatient Prescriptions Prior to Visit  Medication Sig Dispense Refill  . carisoprodol (SOMA) 350 MG tablet Take 1 tablet (350 mg total) by mouth 3 (three) times daily as needed for muscle spasms. 60 tablet 3   No facility-administered medications prior to visit.    Allergies  Allergen Reactions  . Guaifenesin     REACTION: PVCs  . Prednisone     PVC    Review of Systems  Constitutional: Negative for fever and malaise/fatigue.  HENT: Negative for congestion.   Eyes: Negative for discharge.  Respiratory: Negative for shortness of breath.   Cardiovascular: Negative for chest pain, palpitations and leg swelling.  Gastrointestinal: Negative for nausea and abdominal pain.  Genitourinary: Negative for dysuria.  Musculoskeletal: Negative for falls.  Skin: Negative for rash.  Neurological: Negative for loss of consciousness and headaches.  Endo/Heme/Allergies: Negative for environmental allergies.  Psychiatric/Behavioral: Negative for depression. The patient is not nervous/anxious.        Objective:    Physical Exam  Constitutional: He is oriented to person, place, and time. He appears well-developed and well-nourished. No distress.  HENT:  Head: Normocephalic and atraumatic.  Nose: Nose normal.  Eyes: Right eye exhibits no discharge. Left eye exhibits no discharge.  Neck: Normal range of motion. Neck supple.  Cardiovascular: Normal rate and regular rhythm.  No murmur heard. Pulmonary/Chest: Effort normal and breath sounds normal.  Abdominal: Soft. Bowel sounds are normal. There is no tenderness.  Musculoskeletal: He exhibits no edema.  Neurological: He is alert and oriented to person, place, and time.  Skin: Skin is warm and dry.  Psychiatric: He has a normal mood and affect.  Nursing note and vitals reviewed.   BP 131/86 mmHg  Pulse 76  Temp(Src) 98.1 F (36.7 C) (Oral)  Ht  (1.727 m)  Wt 184 lb 1.6 oz (83.507 kg)  BMI 28.00 kg/m2  SpO2  99% Wt Readings from Last 3 Encounters:  04/11/15 184 lb 1.6 oz (83.507 kg)  01/02/15 182 lb 2 oz (82.611 kg)  09/28/14 183 lb 6.4 oz (83.19 kg)     Lab Results  Component Value Date   WBC 6.8 09/28/2014   HGB 15.4 09/28/2014   HCT 45.1 09/28/2014   PLT 251.0 09/28/2014   GLUCOSE 83 09/28/2014   CHOL 182 09/28/2014   TRIG 182.0* 09/28/2014   HDL 40.80 09/28/2014   LDLCALC 105* 09/28/2014   ALT 17 09/28/2014   AST 16 09/28/2014   NA 139 09/28/2014   K 3.9 09/28/2014   CL 102 09/28/2014   CREATININE 0.96 09/28/2014   BUN 11 09/28/2014   CO2 30 09/28/2014   TSH 1.43 09/28/2014   PSA 0.52 01/24/2014   HGBA1C 4.9 09/28/2014    Lab Results  Component Value Date   TSH 1.43 09/28/2014   Lab Results  Component Value Date   WBC 6.8 09/28/2014   HGB 15.4 09/28/2014   HCT 45.1 09/28/2014   MCV 88.1 09/28/2014   PLT 251.0 09/28/2014   Lab Results  Component Value Date   NA 139 09/28/2014   K 3.9 09/28/2014   CO2 30 09/28/2014   GLUCOSE 83 09/28/2014   BUN 11 09/28/2014   CREATININE 0.96 09/28/2014   BILITOT 0.6 09/28/2014   ALKPHOS 66 09/28/2014   AST 16 09/28/2014   ALT 17 09/28/2014   PROT 7.3 09/28/2014   ALBUMIN 4.6 09/28/2014   CALCIUM 9.6 09/28/2014   ANIONGAP 7 06/06/2014   GFR 95.03 09/28/2014   Lab Results  Component Value Date   CHOL 182 09/28/2014   Lab Results  Component Value Date   HDL 40.80 09/28/2014   Lab Results  Component Value Date   LDLCALC 105* 09/28/2014   Lab Results  Component Value Date   TRIG 182.0* 09/28/2014   Lab Results  Component Value Date   CHOLHDL 4 09/28/2014   Lab Results  Component Value Date   HGBA1C 4.9 09/28/2014       Assessment & Plan:   Problem List Items Addressed This Visit    ELEVATED BLOOD PRESSURE WITHOUT DIAGNOSIS OF HYPERTENSION    Well controlled. Encouraged heart healthy diet such as the DASH diet and exercise as tolerated.       Hyperlipidemia, mixed   Relevant Orders   Lipid  panel   Overweight    Encouraged DASH diet, decrease po intake and increase exercise as tolerated. Needs 7-8 hours of sleep nightly. Avoid trans fats, eat small, frequent meals every 4-5 hours with lean proteins, complex carbs and healthy fats. Minimize simple carbs      Pelvic floor dysfunction    Symptoms manageable at present with restrictions. His mother has just been daignosed with interstitial cystitis. Patient encouraged to maintain adequate hydration, probiotics and zinc      Preventative health care - Primary   Relevant  Orders   TSH   CBC   Comprehensive metabolic panel   Lipid panel   Stress reaction    Still doing well at work with his restrictions. No trouble completing his job this year.         I am having Jared Myers maintain his carisoprodol.  Meds ordered this encounter  Medications  . carisoprodol (SOMA) 350 MG tablet    Sig: Take 1 tablet (350 mg total) by mouth 3 (three) times daily as needed for muscle spasms.    Dispense:  60 tablet    Refill:  3     Reuel Derby, MD

## 2015-04-11 NOTE — Patient Instructions (Signed)
Zinc 20-50 mg daily Probiotic daily such as 10 strain NOW cap daily  Interstitial Cystitis Interstitial cystitis is a condition that causes inflammation of the bladder. The bladder is a hollow organ in the lower part of your abdomen. It stores urine after the urine is made by your kidneys. With interstitial cystitis, you may have pain in the bladder area. You may also have a frequent and urgent need to urinate. The severity of interstitial cystitis can vary from person to person. You may have flare-ups of the condition, and then it may go away for a while. For many people who have this condition, it becomes a long-term problem. CAUSES The cause of this condition is not known. RISK FACTORS This condition is more likely to develop in women. SYMPTOMS Symptoms of interstitial cystitis vary, and they can change over time. Symptoms may include:  Discomfort or pain in the bladder area. This can range from mild to severe. The pain may change in intensity as the bladder fills with urine or as it empties.  Pelvic pain.  An urgent need to urinate.  Frequent urination.  Pain during sexual intercourse.  Pinpoint bleeding on the bladder wall. For women, the symptoms often get worse during menstruation. DIAGNOSIS This condition is diagnosed by evaluating your symptoms and ruling out other causes. A physical exam will be done. Various tests may be done to rule out other conditions. Common tests include:  Urine tests.  Cystoscopy. In this test, a tool that is like a very thin telescope is used to look into your bladder.  Biopsy. This involves taking a sample of tissue from the bladder wall to be examined under a microscope. TREATMENT There is no cure for interstitial cystitis, but treatment methods are available to control your symptoms. Work closely with your health care provider to find the treatments that will be most effective for you. Treatment options may include:  Medicines to relieve pain  and to help reduce the number of times that you feel the need to urinate.  Bladder training. This involves learning ways to control when you urinate, such as:  Urinating at scheduled times.  Training yourself to delay urination.  Doing exercises (Kegel exercises) to strengthen the muscles that control urine flow.  Lifestyle changes, such as changing your diet or taking steps to control stress.  Use of a device that provides electrical stimulation in order to reduce pain.  A procedure that stretches your bladder by filling it with air or fluid.  Surgery. This is rare. It is only done for extreme cases if other treatments do not help. HOME CARE INSTRUCTIONS  Take medicines only as directed by your health care provider.  Use bladder training techniques as directed.  Keep a bladder diary to find out which foods, liquids, or activities make your symptoms worse.  Use your bladder diary to schedule bathroom trips. If you are away from home, plan to be near a bathroom at each of your scheduled times.  Make sure you urinate just before you leave the house and just before you go to bed.  Do Kegel exercises as directed by your health care provider.  Do not drink alcohol.  Do not use any tobacco products, including cigarettes, chewing tobacco, or electronic cigarettes. If you need help quitting, ask your health care provider.  Make dietary changes as directed by your health care provider. You may need to avoid spicy foods and foods that contain a high amount of potassium.  Limit your drinking of  beverages that stimulate urination. These include soda, coffee, and tea.  Keep all follow-up visits as directed by your health care provider. This is important. SEEK MEDICAL CARE IF:  Your symptoms do not get better after treatment.  Your pain and discomfort are getting worse.  You have more frequent urges to urinate.  You have a fever. SEEK IMMEDIATE MEDICAL CARE IF:  You are not able  to control your bladder at all.   This information is not intended to replace advice given to you by your health care provider. Make sure you discuss any questions you have with your health care provider.   Document Released: 10/26/2003 Document Revised: 03/17/2014 Document Reviewed: 11/01/2013 Elsevier Interactive Patient Education [employer]

## 2015-04-11 NOTE — Progress Notes (Signed)
Pre visit review using our clinic review tool, if applicable. No additional management support is needed unless otherwise documented below in the visit note. 

## 2015-04-15 NOTE — Assessment & Plan Note (Signed)
Encouraged DASH diet, decrease po intake and increase exercise as tolerated. Needs 7-8 hours of sleep nightly. Avoid trans fats, eat small, frequent meals every 4-5 hours with lean proteins, complex carbs and healthy fats. Minimize simple carbs 

## 2015-04-15 NOTE — Assessment & Plan Note (Signed)
Symptoms manageable at present with restrictions. His mother has just been daignosed with interstitial cystitis. Patient encouraged to maintain adequate hydration, probiotics and zinc

## 2015-04-15 NOTE — Assessment & Plan Note (Signed)
Still doing well at work with his restrictions. No trouble completing his job this year.

## 2015-04-15 NOTE — Assessment & Plan Note (Signed)
Well controlled. Encouraged heart healthy diet such as the DASH diet and exercise as tolerated.  

## 2015-05-11 MED FILL — CARISOPRODOL 350 MG TABLET: 350 | 20 days supply | Qty: 60 | Fill #0

## 2015-06-21 MED FILL — CARISOPRODOL 350 MG TABLET: 350 | 20 days supply | Qty: 60 | Fill #1

## 2015-07-27 MED FILL — CARISOPRODOL 350 MG TABLET: 350 | 20 days supply | Qty: 60 | Fill #2

## 2015-07-30 ENCOUNTER — Other Ambulatory Visit: Payer: Self-pay | Admitting: Family Medicine

## 2015-07-30 MED ORDER — CARISOPRODOL 350 MG PO TABS: 350.0000 mg | ORAL_TABLET | Freq: Three times a day (TID) | ORAL | Status: DC | PRN

## 2015-07-30 NOTE — Telephone Encounter (Signed)
Faxed hardcopy for Jared Myers

## 2015-09-07 MED FILL — CARISOPRODOL 350 MG TABLET: 350 | 20 days supply | Qty: 60 | Fill #3

## 2015-10-09 ENCOUNTER — Other Ambulatory Visit (INDEPENDENT_AMBULATORY_CARE_PROVIDER_SITE_OTHER): Payer: No Typology Code available for payment source

## 2015-10-09 DIAGNOSIS — E782 Mixed hyperlipidemia: Secondary | ICD-10-CM | POA: Diagnosis not present

## 2015-10-09 DIAGNOSIS — Z0000 Encounter for general adult medical examination without abnormal findings: Secondary | ICD-10-CM

## 2015-10-10 LAB — CBC
HCT: 42.5 % (ref 39.0–52.0)
HEMOGLOBIN: 14.7 g/dL (ref 13.0–17.0)
MCHC: 34.5 g/dL (ref 30.0–36.0)
MCV: 87.5 fl (ref 78.0–100.0)
PLATELETS: 258 10*3/uL (ref 150.0–400.0)
RBC: 4.86 Mil/uL (ref 4.22–5.81)
RDW: 12.9 % (ref 11.5–15.5)
WBC: 5.7 10*3/uL (ref 4.0–10.5)

## 2015-10-10 LAB — LIPID PANEL
CHOL/HDL RATIO: 4
Cholesterol: 177 mg/dL (ref 0–200)
HDL: 41.1 mg/dL (ref >39.00)
LDL Cholesterol: 110 mg/dL — ABNORMAL HIGH (ref 0–99)
NONHDL: 136.07
Triglycerides: 131 mg/dL (ref 0.0–149.0)
VLDL: 26.2 mg/dL (ref 0.0–40.0)

## 2015-10-10 LAB — COMPREHENSIVE METABOLIC PANEL
ALK PHOS: 58 U/L (ref 39–117)
ALT: 20 U/L (ref 0–53)
AST: 18 U/L (ref 0–37)
Albumin: 4.4 g/dL (ref 3.5–5.2)
BUN: 14 mg/dL (ref 6–23)
CALCIUM: 9.5 mg/dL (ref 8.4–10.5)
CO2: 27 meq/L (ref 19–32)
CREATININE: 0.96 mg/dL (ref 0.40–1.50)
Chloride: 103 mEq/L (ref 96–112)
GFR: 94.46 mL/min (ref >60.00)
GLUCOSE: 85 mg/dL (ref 70–99)
Potassium: 3.9 mEq/L (ref 3.5–5.1)
Sodium: 139 mEq/L (ref 135–145)
Total Bilirubin: 0.4 mg/dL (ref 0.2–1.2)
Total Protein: 7.3 g/dL (ref 6.0–8.3)

## 2015-10-10 LAB — TSH: TSH: 0.98 u[IU]/mL (ref 0.35–4.50)

## 2015-10-11 ENCOUNTER — Ambulatory Visit (INDEPENDENT_AMBULATORY_CARE_PROVIDER_SITE_OTHER): Payer: No Typology Code available for payment source | Admitting: Family Medicine

## 2015-10-11 ENCOUNTER — Encounter: Payer: Self-pay | Admitting: Family Medicine

## 2015-10-11 DIAGNOSIS — K5900 Constipation, unspecified: Secondary | ICD-10-CM

## 2015-10-11 DIAGNOSIS — R739 Hyperglycemia, unspecified: Secondary | ICD-10-CM | POA: Diagnosis not present

## 2015-10-11 DIAGNOSIS — Z0000 Encounter for general adult medical examination without abnormal findings: Secondary | ICD-10-CM | POA: Diagnosis not present

## 2015-10-11 DIAGNOSIS — R030 Elevated blood-pressure reading, without diagnosis of hypertension: Secondary | ICD-10-CM

## 2015-10-11 DIAGNOSIS — F430 Acute stress reaction: Secondary | ICD-10-CM

## 2015-10-11 DIAGNOSIS — M6289 Other specified disorders of muscle: Secondary | ICD-10-CM

## 2015-10-11 DIAGNOSIS — F419 Anxiety disorder, unspecified: Secondary | ICD-10-CM

## 2015-10-11 DIAGNOSIS — F411 Generalized anxiety disorder: Secondary | ICD-10-CM

## 2015-10-11 DIAGNOSIS — E663 Overweight: Secondary | ICD-10-CM

## 2015-10-11 DIAGNOSIS — E782 Mixed hyperlipidemia: Secondary | ICD-10-CM

## 2015-10-11 NOTE — Progress Notes (Signed)
Patient ID: Jared Myers, male   DOB: 26-Feb-1981, 35 y.o.   MRN: [id number]   Subjective:    Patient ID: Jared Myers, male    DOB: [birth year], 35 y.o.   MRN: [id number]  Chief Complaint  Patient presents with  . Annual Exam    pt. would like to discuss fluctuation in mood after taking Soma medication; pelvic floor pain has worsened    HPI Patient is in today for annual exam. He is doing welll. No recent acute illness or hospitalization. Continues to have intermittent pelvic pain but with physical therapy and muscle relaxers is noting adequate relief most of time. He and his wife are considering starting a family and he is happy but also anxious about this. His work continues to abide by his accommodations for his pain which is helpful. Denies CP/palp/SOB/HA/congestion/fevers/GI or GU c/o. Taking meds as prescribed  Past Medical History:  Diagnosis Date  . Acute upper respiratory infections of unspecified site 04/28/2012  . 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 Constipation 12/18/2013  . Hyperglycemia 09/28/2014  . Hyperlipidemia, mixed 06/22/2014  . Infectious mononucleosis 07/18/2013  . Inguinal hernia 05/12/2012  . Low back pain 12/16/2013  . Overweight 06/22/2014  . Pelvic floor dysfunction   . Premature ventricular beat   . Preventative health care 12/18/2013  . Splenomegaly 07/18/2013  . Tremulousness 08/14/2013    No past surgical history on file.  Family History  Problem Relation Age of Onset  . Cancer Mother     Non Hodkins lymphoma, breast cancer  . Other Father     pituatary adenoma  . Cancer Paternal Uncle     recurrent prostate   . Heart disease Maternal Grandmother     chf  . Heart disease Maternal Grandfather     chf  . Heart disease Paternal Grandfather     chf  . Anemia Sister     Social History   Social History  . Marital status: Divorced    Spouse name: N/A  . Number of children: N/A  . Years of education: N/A   Occupational History  .  Not on file.   Social History Main Topics  . Smoking status: Never Smoker  . Smokeless tobacco: Not on file  . Alcohol use No  . Drug use: No  . Sexual activity: No     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, works at [employer], 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   Other Topics Concern  . Not on file   Social History Narrative  . No narrative on file    Outpatient Medications Prior to Visit  Medication Sig Dispense Refill  . carisoprodol (SOMA) 350 MG tablet Take 1 tablet (350 mg total) by mouth 3 (three) times daily as needed for muscle spasms. 60 tablet 3   No facility-administered medications prior to visit.     Allergies  Allergen Reactions  . Guaifenesin     REACTION: PVCs  . Prednisone     PVC    Review of Systems  Constitutional: Negative for fever and malaise/fatigue.  HENT: Negative for congestion.   Eyes: Negative for blurred vision.  Respiratory: Negative for shortness of breath.   Cardiovascular: Negative for chest pain, palpitations and leg swelling.  Gastrointestinal: Positive for abdominal pain. Negative for blood in stool and nausea.  Genitourinary: Negative for dysuria and frequency.  Musculoskeletal: Positive for back pain. Negative for falls.  Skin: Negative for rash.  Neurological: Negative for dizziness, loss of consciousness  and headaches.  Endo/Heme/Allergies: Negative for environmental allergies.  Psychiatric/Behavioral: Negative for depression. The patient is not nervous/anxious.        Objective:    Physical Exam  Constitutional: He is oriented to person, place, and time. He appears well-developed and well-nourished. No distress.  HENT:  Head: Normocephalic and atraumatic.  Eyes: Conjunctivae are normal.  Neck: Neck supple. No thyromegaly present.  Cardiovascular: Normal rate, regular rhythm and normal heart sounds.   No murmur heard. Pulmonary/Chest: Effort normal and breath sounds normal. No respiratory distress. He has no wheezes.    Abdominal: Soft. Bowel sounds are normal. He exhibits no mass. There is no tenderness.  Musculoskeletal: He exhibits no edema.  Lymphadenopathy:    He has no cervical adenopathy.  Neurological: He is alert and oriented to person, place, and time.  Skin: Skin is warm and dry.  Psychiatric: He has a normal mood and affect. His behavior is normal.    BP 124/82 (BP Location: Right Arm)   Pulse 72   Temp 97.8 F (36.6 C) (Oral)   Ht 5' 8.11" (1.73 m)   Wt 183 lb (83 kg)   SpO2 99%   BMI 27.74 kg/m  Wt Readings from Last 3 Encounters:  10/11/15 183 lb (83 kg)  04/11/15 184 lb 1.6 oz (83.5 kg)  01/02/15 182 lb 2 oz (82.6 kg)     Lab Results  Component Value Date   WBC 5.7 10/09/2015   HGB 14.7 10/09/2015   HCT 42.5 10/09/2015   PLT 258.0 10/09/2015   GLUCOSE 85 10/09/2015   CHOL 177 10/09/2015   TRIG 131.0 10/09/2015   HDL 41.10 10/09/2015   LDLCALC 110 (H) 10/09/2015   ALT 20 10/09/2015   AST 18 10/09/2015   NA 139 10/09/2015   K 3.9 10/09/2015   CL 103 10/09/2015   CREATININE 0.96 10/09/2015   BUN 14 10/09/2015   CO2 27 10/09/2015   TSH 0.98 10/09/2015   PSA 0.52 01/24/2014   HGBA1C 4.9 09/28/2014    Lab Results  Component Value Date   TSH 0.98 10/09/2015   Lab Results  Component Value Date   WBC 5.7 10/09/2015   HGB 14.7 10/09/2015   HCT 42.5 10/09/2015   MCV 87.5 10/09/2015   PLT 258.0 10/09/2015   Lab Results  Component Value Date   NA 139 10/09/2015   K 3.9 10/09/2015   CO2 27 10/09/2015   GLUCOSE 85 10/09/2015   BUN 14 10/09/2015   CREATININE 0.96 10/09/2015   BILITOT 0.4 10/09/2015   ALKPHOS 58 10/09/2015   AST 18 10/09/2015   ALT 20 10/09/2015   PROT 7.3 10/09/2015   ALBUMIN 4.4 10/09/2015   CALCIUM 9.5 10/09/2015   ANIONGAP 7 06/06/2014   GFR 94.46 10/09/2015   Lab Results  Component Value Date   CHOL 177 10/09/2015   Lab Results  Component Value Date   HDL 41.10 10/09/2015   Lab Results  Component Value Date   LDLCALC 110  (H) 10/09/2015   Lab Results  Component Value Date   TRIG 131.0 10/09/2015   Lab Results  Component Value Date   CHOLHDL 4 10/09/2015   Lab Results  Component Value Date   HGBA1C 4.9 09/28/2014       Assessment & Plan:   Problem List Items Addressed This Visit    ELEVATED BLOOD PRESSURE WITHOUT DIAGNOSIS OF HYPERTENSION    Well controlled. Encouraged heart healthy diet such as the DASH diet and exercise as tolerated.  Anxiety in acute stress reaction    Notes periods of increased irritability at work recently. Usually with stressors at work. Consider further meds or counselors.       Pelvic floor dysfunction    Has had a flare in pain  Recently and has had to leave work early a few times. Is having more pain in left lower quadrant. Continues to go to chiropractor, Dr Fredric Mare for adjustments which has helped some, encouraged to continue this care.      Preventative health care    Patient encouraged to maintain heart healthy diet, regular exercise, adequate sleep. Consider daily probiotics. Take medications as prescribed. Given and reviewed copy of ACP documents from [facility] and encouraged to complete and return      Constipation    No recent difficulties. Maintain adequate fiber, exercise and fluids. Daily probiotics.      Hyperlipidemia, mixed    Encouraged heart healthy diet, increase exercise, avoid trans fats, consider a krill oil cap daily      Overweight    Encouraged DASH diet, decrease po intake and increase exercise as tolerated. Needs 7-8 hours of sleep nightly. Avoid trans fats, eat small, frequent meals every 4-5 hours with lean proteins, complex carbs and healthy fats. Minimize simple carbs      Hyperglycemia    minimize simple carbs. Increase exercise as tolerated.        Other Visit Diagnoses   None.     I am having Mr. Coody maintain his carisoprodol.  No orders of the defined types were placed in this  encounter.    Danise Edge, MD

## 2015-10-11 NOTE — Assessment & Plan Note (Signed)
Patient encouraged to maintain heart healthy diet, regular exercise, adequate sleep. Consider daily probiotics. Take medications as prescribed. Given and reviewed copy of ACP documents from [place] Secretary of State and encouraged to complete and return 

## 2015-10-11 NOTE — Progress Notes (Signed)
Pre visit review using our clinic review tool, if applicable. No additional management support is needed unless otherwise documented below in the visit note. 

## 2015-10-11 NOTE — Patient Instructions (Signed)

## 2015-10-11 NOTE — Assessment & Plan Note (Signed)
minimize simple carbs. Increase exercise as tolerated.  

## 2015-10-11 NOTE — Assessment & Plan Note (Signed)
Notes periods of increased irritability at work recently. Usually with stressors at work. Consider further meds or counselors.

## 2015-10-11 NOTE — Assessment & Plan Note (Signed)
Encouraged heart healthy diet, increase exercise, avoid trans fats, consider a krill oil cap daily 

## 2015-10-11 NOTE — Assessment & Plan Note (Signed)
Has had a flare in pain  Recently and has had to leave work early a few times. Is having more pain in left lower quadrant. Continues to go to chiropractor, Dr Fredric Mare for adjustments which has helped some, encouraged to continue this care.

## 2015-10-11 NOTE — Assessment & Plan Note (Signed)
Well controlled. Encouraged heart healthy diet such as the DASH diet and exercise as tolerated.  

## 2015-10-16 MED FILL — CARISOPRODOL 350 MG TABLET: 350 | 20 days supply | Qty: 60 | Fill #0

## 2015-10-21 NOTE — Assessment & Plan Note (Signed)
No recent difficulties. Maintain adequate fiber, exercise and fluids. Daily probiotics.

## 2015-10-21 NOTE — Assessment & Plan Note (Signed)
Encouraged DASH diet, decrease po intake and increase exercise as tolerated. Needs 7-8 hours of sleep nightly. Avoid trans fats, eat small, frequent meals every 4-5 hours with lean proteins, complex carbs and healthy fats. Minimize simple carbs 

## 2015-10-29 ENCOUNTER — Telehealth: Payer: Self-pay | Admitting: Family Medicine

## 2015-10-29 NOTE — Telephone Encounter (Signed)
Relation to pt:self Call back number:[phone]   Reason for call:  Patient dropped off FMLA paperwork placed in [employer] mailbox. Paperwork due 11/11/15, informed patient turn around time is 5 to 7 business day.

## 2015-10-30 NOTE — Telephone Encounter (Signed)
Paperwork received. Paperwork process initiated.   

## 2015-11-02 NOTE — Telephone Encounter (Signed)
Paperwork filled in as much as possible and forwarded to PCP to review and completion.

## 2015-11-06 NOTE — Telephone Encounter (Signed)
FMLA paperwork completed and mailed using envelope provided.  Paperwork copied, numbered and sent for scanning.

## 2015-11-13 ENCOUNTER — Encounter (HOSPITAL_BASED_OUTPATIENT_CLINIC_OR_DEPARTMENT_OTHER): Payer: Self-pay | Admitting: Emergency Medicine

## 2015-11-13 ENCOUNTER — Emergency Department (HOSPITAL_BASED_OUTPATIENT_CLINIC_OR_DEPARTMENT_OTHER)
Admission: EM | Admit: 2015-11-13 | Discharge: 2015-11-13 | Disposition: A | Discharge status: Home / Self Care | Payer: No Typology Code available for payment source | Attending: Dermatology | Admitting: Dermatology

## 2015-11-13 DIAGNOSIS — Y999 Unspecified external cause status: Secondary | ICD-10-CM | POA: Insufficient documentation

## 2015-11-13 DIAGNOSIS — R51 Headache: Secondary | ICD-10-CM | POA: Insufficient documentation

## 2015-11-13 DIAGNOSIS — S80862A Insect bite (nonvenomous), left lower leg, initial encounter: Secondary | ICD-10-CM | POA: Diagnosis not present

## 2015-11-13 DIAGNOSIS — W57XXXA Bitten or stung by nonvenomous insect and other nonvenomous arthropods, initial encounter: Secondary | ICD-10-CM | POA: Diagnosis not present

## 2015-11-13 DIAGNOSIS — Y939 Activity, unspecified: Secondary | ICD-10-CM | POA: Diagnosis not present

## 2015-11-13 DIAGNOSIS — Z5321 Procedure and treatment not carried out due to patient leaving prior to being seen by health care provider: Secondary | ICD-10-CM | POA: Insufficient documentation

## 2015-11-13 DIAGNOSIS — R110 Nausea: Secondary | ICD-10-CM | POA: Insufficient documentation

## 2015-11-13 DIAGNOSIS — Y929 Unspecified place or not applicable: Secondary | ICD-10-CM | POA: Diagnosis not present

## 2015-11-13 NOTE — ED Notes (Signed)
Pt to nse desk with ? cont'd wait time-advised no available rooms but he appears to be next for tx room unless higher acuity presents first-NAD-steady gait

## 2015-11-13 NOTE — ED Triage Notes (Signed)
Pt c/o headache and nausea x 3 days. Pt noticed numbness to area on left lower leg and has since noticed a possible insect bite. Pt states he has had chest pain intermittent x 2 days.

## 2015-11-13 NOTE — ED Notes (Signed)
Pt states he is leaving-NAD-steady gait

## 2015-11-23 MED FILL — CARISOPRODOL 350 MG TABLET: 350 | 20 days supply | Qty: 60 | Fill #1

## 2016-01-15 ENCOUNTER — Ambulatory Visit (INDEPENDENT_AMBULATORY_CARE_PROVIDER_SITE_OTHER): Payer: No Typology Code available for payment source | Admitting: Family Medicine

## 2016-01-15 ENCOUNTER — Encounter: Payer: Self-pay | Admitting: Family Medicine

## 2016-01-15 VITALS — BP 108/70 | HR 85 | Temp 98.5°F | Ht 67.0 in | Wt 186.2 lb

## 2016-01-15 DIAGNOSIS — Z23 Encounter for immunization: Secondary | ICD-10-CM

## 2016-01-15 DIAGNOSIS — G8929 Other chronic pain: Secondary | ICD-10-CM

## 2016-01-15 DIAGNOSIS — E663 Overweight: Secondary | ICD-10-CM | POA: Diagnosis not present

## 2016-01-15 DIAGNOSIS — M6289 Other specified disorders of muscle: Secondary | ICD-10-CM

## 2016-01-15 DIAGNOSIS — E782 Mixed hyperlipidemia: Secondary | ICD-10-CM

## 2016-01-15 DIAGNOSIS — M25572 Pain in left ankle and joints of left foot: Secondary | ICD-10-CM

## 2016-01-15 MED ORDER — TIZANIDINE HCL 2 MG PO TABS: 2.0000 mg | ORAL_TABLET | Freq: Three times a day (TID) | ORAL | 3 refills | Status: DC | PRN

## 2016-01-15 MED FILL — tiZANidine HCL 2 MG TABS: 2 | 20 days supply | Qty: 60 | Fill #0

## 2016-01-15 NOTE — Progress Notes (Signed)
Pre visit review using our clinic review tool, if applicable. No additional management support is needed unless otherwise documented below in the visit note. 

## 2016-01-15 NOTE — Assessment & Plan Note (Signed)
[facility] and is doing better at this time. He felt it was making him more irritable.

## 2016-01-15 NOTE — Assessment & Plan Note (Signed)
Encouraged DASH diet, decrease po intake and increase exercise as tolerated. Needs 7-8 hours of sleep nightly. Avoid trans fats, eat small, frequent meals every 4-5 hours with lean proteins, complex carbs and healthy fats. Minimize simple carbs 

## 2016-01-15 NOTE — Assessment & Plan Note (Signed)
Encouraged heart healthy diet, increase exercise, avoid trans fats, consider a krill oil cap daily 

## 2016-01-15 NOTE — Patient Instructions (Addendum)
increase rest and hydration, add probiotics such as NOW 10 strain caps daily, zinc such as Coldeze or Xicam. Treat fevers as needed. Vitamin C 500 to 1000 mg daily, aged or black garlic. Elderberry liquid or caps  [employer]  DASH Eating Plan DASH stands for "Dietary Approaches to Stop Hypertension." The DASH eating plan is a healthy eating plan that has been shown to reduce high blood pressure (hypertension). Additional health benefits may include reducing the risk of type 2 diabetes mellitus, heart disease, and stroke. The DASH eating plan may also help with weight loss. WHAT DO I NEED TO KNOW ABOUT THE DASH EATING PLAN? For the DASH eating plan, you will follow these general guidelines:  Choose foods with a percent daily value for sodium of less than 5% (as listed on the food label).  Use salt-free seasonings or herbs instead of table salt or sea salt.  Check with your health care provider or pharmacist before using salt substitutes.  Eat lower-sodium products, often labeled as "lower sodium" or "no salt added."  Eat fresh foods.  Eat more vegetables, fruits, and low-fat dairy products.  Choose whole grains. Look for the word "whole" as the first word in the ingredient list.  Choose fish and skinless chicken or [place]key more often than red meat. Limit fish, poultry, and meat to 6 oz (170 g) each day.  Limit sweets, desserts, sugars, and sugary drinks.  Choose heart-healthy fats.  Limit cheese to 1 oz (28 g) per day.  Eat more home-cooked food and less restaurant, buffet, and fast food.  Limit fried foods.  Cook foods using methods other than frying.  Limit canned vegetables. If you do use them, rinse them well to decrease the sodium.  When eating at a restaurant, ask that your food be prepared with less salt, or no salt if possible. WHAT FOODS CAN I EAT? Seek help from a dietitian for individual calorie needs. Grains Whole grain or whole wheat  bread. Brown rice. Whole grain or whole wheat pasta. Quinoa, bulgur, and whole grain cereals. Low-sodium cereals. Corn or whole wheat flour tortillas. Whole grain cornbread. Whole grain crackers. Low-sodium crackers. Vegetables Fresh or frozen vegetables (raw, steamed, roasted, or grilled). Low-sodium or reduced-sodium tomato and vegetable juices. Low-sodium or reduced-sodium tomato sauce and paste. Low-sodium or reduced-sodium canned vegetables.  Fruits All fresh, canned (in natural juice), or frozen fruits. Meat and Other Protein Products Ground beef (85% or leaner), grass-fed beef, or beef trimmed of fat. Skinless chicken or [place]key. Ground chicken or [place]key. Pork trimmed of fat. All fish and seafood. Eggs. Dried beans, peas, or lentils. Unsalted nuts and seeds. Unsalted canned beans. Dairy Low-fat dairy products, such as skim or 1% milk, 2% or reduced-fat cheeses, low-fat ricotta or cottage cheese, or plain low-fat yogurt. Low-sodium or reduced-sodium cheeses. Fats and Oils Tub margarines without trans fats. Light or reduced-fat mayonnaise and salad dressings (reduced sodium). Avocado. Safflower, olive, or canola oils. Natural peanut or almond butter. Other Unsalted popcorn and pretzels. The items listed above may not be a complete list of recommended foods or beverages. Contact your dietitian for more options. WHAT FOODS ARE NOT RECOMMENDED? Grains White bread. White pasta. White rice. Refined cornbread. Bagels and croissants. Crackers that contain trans fat. Vegetables Creamed or fried vegetables. Vegetables in a cheese sauce. Regular canned vegetables. Regular canned tomato sauce and paste. Regular tomato and vegetable juices. Fruits Dried fruits. Canned fruit in light or heavy syrup. Fruit juice. Meat and Other Protein  Products Fatty cuts of meat. Ribs, chicken wings, bacon, sausage, bologna, salami, chitterlings, fatback, hot dogs, bratwurst, and packaged luncheon meats. Salted nuts and  seeds. Canned beans with salt. Dairy Whole or 2% milk, cream, half-and-half, and cream cheese. Whole-fat or sweetened yogurt. Full-fat cheeses or blue cheese. Nondairy creamers and whipped toppings. Processed cheese, cheese spreads, or cheese curds. Condiments Onion and garlic salt, seasoned salt, table salt, and sea salt. Canned and packaged gravies. Worcestershire sauce. Tartar sauce. Barbecue sauce. Teriyaki sauce. Soy sauce, including reduced sodium. Steak sauce. Fish sauce. Oyster sauce. Cocktail sauce. Horseradish. Ketchup and mustard. Meat flavorings and tenderizers. Bouillon cubes. Hot sauce. Tabasco sauce. Marinades. Taco seasonings. Relishes. Fats and Oils Butter, stick margarine, lard, shortening, ghee, and bacon fat. Coconut, palm kernel, or palm oils. Regular salad dressings. Other Pickles and olives. Salted popcorn and pretzels. The items listed above may not be a complete list of foods and beverages to avoid. Contact your dietitian for more information. WHERE CAN I FIND MORE INFORMATION? National Heart, Lung, and Blood Institute: [employer].itwww.nhlbi.nih.gov/health/health-topics/topics/dash/   This information is not intended to replace advice given to you by your health care provider. Make sure you discuss any questions you have with your health care provider.   Document Released: 02/13/2011 Document Revised: 03/17/2014 Document Reviewed: 12/29/2012 Elsevier Interactive Patient Education [employer] Elsevier Inc.

## 2016-01-17 ENCOUNTER — Ambulatory Visit (INDEPENDENT_AMBULATORY_CARE_PROVIDER_SITE_OTHER): Payer: No Typology Code available for payment source | Admitting: Family Medicine

## 2016-01-17 ENCOUNTER — Encounter: Payer: Self-pay | Admitting: Family Medicine

## 2016-01-17 DIAGNOSIS — G8929 Other chronic pain: Secondary | ICD-10-CM | POA: Diagnosis not present

## 2016-01-17 DIAGNOSIS — M25572 Pain in left ankle and joints of left foot: Secondary | ICD-10-CM | POA: Diagnosis not present

## 2016-01-17 NOTE — Patient Instructions (Signed)
You are getting an irritation of the tibial nerve likely from scar tissue from your remote injury. Start with arch supports - if you're getting an unusual version of tarsal tunnel syndrome (which also irritates this nerve) these should help tremendously - something like dr. Jari Sportsmanscholls active series, superfeet, spencos. Avoid flat shoes, barefoot walking as much as possible next 6 weeks. Consider nerve-blocking medication like nortriptyline as next step. If you still weren't improving we could consider hydrodissection of the nerve or nerve conduction studies. Follow up with me in 6 weeks for reevaluation.

## 2016-01-20 NOTE — Assessment & Plan Note (Signed)
Continues to struggle with intermittent pain when he over does it but is managing fairly well. No changes. Maintain current work restrictions.

## 2016-01-20 NOTE — Progress Notes (Signed)
Patient ID: Jared Myers, male   DOB: 05/01/1980, 35 y.o.   MRN: [id number]   Subjective:    Patient ID: Jared Myers, male    DOB: 04-Apr-1980, 35 y.o.   MRN: [id number]  Chief Complaint  Patient presents with  . Follow-up    HPI Patient is in today for follow up. He feels well today. No recent illness or acute concerns. He continues to have intermittent low back pain and pelvic floor pain. Manageable as long as he does not over do his daily activities. He is stressed about work and starting a a family but over all he feels well. Denies CP/palp/SOB/HA/congestion/fevers/GI or GU c/o. Taking meds as prescribed  Past Medical History:  Diagnosis Date  . Acute upper respiratory infections of unspecified site 04/28/2012  . 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 Constipation 12/18/2013  . Hyperglycemia 09/28/2014  . Hyperlipidemia, mixed 06/22/2014  . Infectious mononucleosis 07/18/2013  . Inguinal hernia 05/12/2012  . Low back pain 12/16/2013  . Overweight 06/22/2014  . Pelvic floor dysfunction   . Premature ventricular beat   . Preventative health care 12/18/2013  . Splenomegaly 07/18/2013  . Tremulousness 08/14/2013    No past surgical history on file.  Family History  Problem Relation Age of Onset  . Cancer Mother     Non Hodkins lymphoma, breast cancer  . Other Father     pituatary adenoma  . Cancer Paternal Uncle     recurrent prostate   . Heart disease Maternal Grandmother     chf  . Heart disease Maternal Grandfather     chf  . Heart disease Paternal Grandfather     chf  . Anemia Sister     Social History   Social History  . Marital status: Divorced    Spouse name: N/A  . Number of children: N/A  . Years of education: N/A   Occupational History  . Not on file.   Social History Main Topics  . Smoking status: Never Smoker  . Smokeless tobacco: Never Used  . Alcohol use No  . Drug use: No  . 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 Not on file   Social History Narrative  . No narrative on file    Outpatient Medications Prior to Visit  Medication Sig Dispense Refill  . carisoprodol (SOMA) 350 MG tablet Take 1 tablet (350 mg total) by mouth 3 (three) times daily as needed for muscle spasms. 60 tablet 3   No facility-administered medications prior to visit.     Allergies  Allergen Reactions  . Guaifenesin     REACTION: PVCs  . Prednisone     PVC    Review of Systems  Constitutional: Negative for fever and malaise/fatigue.  HENT: Negative for congestion.   Eyes: Negative for blurred vision.  Respiratory: Negative for shortness of breath.   Cardiovascular: Negative for chest pain, palpitations and leg swelling.  Gastrointestinal: Positive for abdominal pain. Negative for blood in stool and nausea.  Genitourinary: Negative for dysuria and frequency.  Musculoskeletal: Positive for back pain. Negative for falls.  Skin: Negative for rash.  Neurological: Negative for dizziness, loss of consciousness and headaches.  Endo/Heme/Allergies: Negative for environmental allergies.  Psychiatric/Behavioral: Negative for depression. The patient is not nervous/anxious.        Objective:    Physical Exam  Constitutional: He  is oriented to person, place, and time. He appears well-developed and well-nourished. No distress.  HENT:  Head: Normocephalic and atraumatic.  Nose: Nose normal.  Eyes: Right eye exhibits no discharge. Left eye exhibits no discharge.  Neck: Normal range of motion. Neck supple.  Cardiovascular: Normal rate and regular rhythm.   No murmur heard. Pulmonary/Chest: Effort normal and breath sounds normal.  Abdominal: Soft. Bowel sounds are normal. There is no tenderness.  Musculoskeletal: He exhibits no edema.  Neurological: He is alert and oriented to person, place, and time.  Skin: Skin is warm and dry.    Psychiatric: He has a normal mood and affect.  Nursing note and vitals reviewed.   BP 108/70 (BP Location: Left Arm, Patient Position: Sitting, Cuff Size: Large)   Pulse 85   Temp 98.5 F (36.9 C) (Oral)   Ht 5\' 7"  (1.702 m)   Wt 186 lb 4 oz (84.5 kg)   SpO2 96%   BMI 29.17 kg/m  Wt Readings from Last 3 Encounters:  01/17/16 186 lb (84.4 kg)  01/15/16 186 lb 4 oz (84.5 kg)  11/13/15 180 lb (81.6 kg)     Lab Results  Component Value Date   WBC 5.7 10/09/2015   HGB 14.7 10/09/2015   HCT 42.5 10/09/2015   PLT 258.0 10/09/2015   GLUCOSE 85 10/09/2015   CHOL 177 10/09/2015   TRIG 131.0 10/09/2015   HDL 41.10 10/09/2015   LDLCALC 110 (H) 10/09/2015   ALT 20 10/09/2015   AST 18 10/09/2015   NA 139 10/09/2015   K 3.9 10/09/2015   CL 103 10/09/2015   CREATININE 0.96 10/09/2015   BUN 14 10/09/2015   CO2 27 10/09/2015   TSH 0.98 10/09/2015   PSA 0.52 01/24/2014   HGBA1C 4.9 09/28/2014    Lab Results  Component Value Date   TSH 0.98 10/09/2015   Lab Results  Component Value Date   WBC 5.7 10/09/2015   HGB 14.7 10/09/2015   HCT 42.5 10/09/2015   MCV 87.5 10/09/2015   PLT 258.0 10/09/2015   Lab Results  Component Value Date   NA 139 10/09/2015   K 3.9 10/09/2015   CO2 27 10/09/2015   GLUCOSE 85 10/09/2015   BUN 14 10/09/2015   CREATININE 0.96 10/09/2015   BILITOT 0.4 10/09/2015   ALKPHOS 58 10/09/2015   AST 18 10/09/2015   ALT 20 10/09/2015   PROT 7.3 10/09/2015   ALBUMIN 4.4 10/09/2015   CALCIUM 9.5 10/09/2015   ANIONGAP 7 06/06/2014   GFR 94.46 10/09/2015   Lab Results  Component Value Date   CHOL 177 10/09/2015   Lab Results  Component Value Date   HDL 41.10 10/09/2015   Lab Results  Component Value Date   LDLCALC 110 (H) 10/09/2015   Lab Results  Component Value Date   TRIG 131.0 10/09/2015   Lab Results  Component Value Date   CHOLHDL 4 10/09/2015   Lab Results  Component Value Date   HGBA1C 4.9 09/28/2014       Assessment &  Plan:   Problem List Items Addressed This Visit    PFD (pelvic floor dysfunction)    Continues to struggle with intermittent pain when he over does it but is managing fairly well. No changes. Maintain current work restrictions.      Pelvic floor dysfunction    Has stopped the Soma and is doing better at this time. He felt it was making him more irritable.  Hyperlipidemia, mixed    Encouraged heart healthy diet, increase exercise, avoid trans fats, consider a krill oil cap daily      Overweight    Encouraged DASH diet, decrease po intake and increase exercise as tolerated. Needs 7-8 hours of sleep nightly. Avoid trans fats, eat small, frequent meals every 4-5 hours with lean proteins, complex carbs and healthy fats. Minimize simple carbs       Other Visit Diagnoses    Chronic pain of left ankle    -  Primary   Relevant Medications   tiZANidine (ZANAFLEX) 2 MG tablet   Other Relevant Orders   Ambulatory referral to Sports Medicine   Encounter for immunization       Relevant Medications   tiZANidine (ZANAFLEX) 2 MG tablet   Other Relevant Orders   Flu Vaccine QUAD 36+ mos IM (Completed)      I have discontinued Mr. Lorenza CambridgeMackay's carisoprodol. I am also having him start on tiZANidine.  Meds ordered this encounter  Medications  . tiZANidine (ZANAFLEX) 2 MG tablet    Sig: Take 1 tablet (2 mg total) by mouth every 8 (eight) hours as needed for muscle spasms.    Dispense:  60 tablet    Refill:  3     Danise EdgeBLYTH, STACEY, MD

## 2016-01-21 DIAGNOSIS — M25571 Pain in right ankle and joints of right foot: Secondary | ICD-10-CM | POA: Insufficient documentation

## 2016-01-21 DIAGNOSIS — M25572 Pain in left ankle and joints of left foot: Secondary | ICD-10-CM | POA: Insufficient documentation

## 2016-01-21 NOTE — Progress Notes (Signed)
PCP and consultation requested by: Danise EdgeBLYTH, STACEY, MD  Subjective:   HPI: Patient is a 35 y.o. male here for left ankle pain.  Patient reports he initially injured his left ankle when playing ball back in 2008. Felt medial pain at that time and numbness. Has had repeated flares of this especially when bending down and squatting. Seems to be occurring more frequently. Pain 0/10 at rest, just gets burning that lasts just less than a minute when this comes on. Radiographs negative. Tried massages. No skin changes.  Past Medical History:  Diagnosis Date  . Acute upper respiratory infections of unspecified site 04/28/2012  . 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 Constipation 12/18/2013  . Hyperglycemia 09/28/2014  . Hyperlipidemia, mixed 06/22/2014  . Infectious mononucleosis 07/18/2013  . Inguinal hernia 05/12/2012  . Low back pain 12/16/2013  . Overweight 06/22/2014  . Pelvic floor dysfunction   . Premature ventricular beat   . Preventative health care 12/18/2013  . Splenomegaly 07/18/2013  . Tremulousness 08/14/2013    Current Outpatient Prescriptions on File Prior to Visit  Medication Sig Dispense Refill  . tiZANidine (ZANAFLEX) 2 MG tablet Take 1 tablet (2 mg total) by mouth every 8 (eight) hours as needed for muscle spasms. 60 tablet 3   No current facility-administered medications on file prior to visit.     No past surgical history on file.  Allergies  Allergen Reactions  . Guaifenesin     REACTION: PVCs  . Prednisone     PVC    Social History   Social History  . Marital status: Divorced    Spouse name: N/A  . Number of children: N/A  . Years of education: N/A   Occupational History  . Not on file.   Social History Main Topics  . Smoking status: Never Smoker  . Smokeless tobacco: Never Used  . Alcohol use No  . Drug use: No  . 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 Not on file   Social History Narrative  . No narrative on file    Family History  Problem Relation Age of Onset  . Cancer Mother     Non Hodkins lymphoma, breast cancer  . Other Father     pituatary adenoma  . Cancer Paternal Uncle     recurrent prostate   . Heart disease Maternal Grandmother     chf  . Heart disease Maternal Grandfather     chf  . Heart disease Paternal Grandfather     chf  . Anemia Sister     BP (!) 125/91   Pulse 72   Ht 5\' 7"  (1.702 m)   Wt 186 lb (84.4 kg)   BMI 29.13 kg/m   Review of Systems: See HPI above.     Objective:  Physical Exam:  Gen: NAD, comfortable in exam room  Left ankle: No gross deformity, swelling, ecchymoses.  Mod pronation. FROM with 5/5 strength. No TTP Negative ant drawer and talar tilt.   Negative syndesmotic compression. Thompsons test negative. NV intact distally. Negative tinels over tibial nerve.  Right ankle: FROM without pain.  MSK u/s left ankle: tibial nerve appears intact without edema, other abnormalities.   Assessment & Plan:  1. Left ankle pain - consistent with localized tibial nerve irritation, suspect from scar tissue from his remote ankle injury.  Nerve appears intact and motor  function is normal.  Stressed importance of arch supports which should lessen frequency of this.  Avoid flat shoes, barefoot walking.  Can consider NCVs or hydrodissection if still having problems or becomes more frequent.  F/u in 6 weeks.

## 2016-01-21 NOTE — Assessment & Plan Note (Signed)
consistent with localized tibial nerve irritation, suspect from scar tissue from his remote ankle injury.  Nerve appears intact and motor function is normal.  Stressed importance of arch supports which should lessen frequency of this.  Avoid flat shoes, barefoot walking.  Can consider NCVs or hydrodissection if still having problems or becomes more frequent.  F/u in 6 weeks.

## 2016-02-27 ENCOUNTER — Ambulatory Visit (INDEPENDENT_AMBULATORY_CARE_PROVIDER_SITE_OTHER): Payer: No Typology Code available for payment source | Admitting: Family Medicine

## 2016-02-27 ENCOUNTER — Encounter: Payer: Self-pay | Admitting: Family Medicine

## 2016-02-27 DIAGNOSIS — S8992XA Unspecified injury of left lower leg, initial encounter: Secondary | ICD-10-CM | POA: Diagnosis not present

## 2016-02-27 NOTE — Patient Instructions (Signed)
You have a knee contusion and Grade 1 patellar tendon strain. The rest of your exam is reassuring. Ice the area 15 minutes at a time 3-4 times a day. Ibuprofen 800mg  three times a day with food (you could try aleve 2 tabs twice a day instead with food). Patellar tendon strap when up and walking around. Straight leg raises, knee extensions, decline squats 3 sets of 10 once a day for next 6 weeks. Follow up with me in 6 weeks.

## 2016-02-28 DIAGNOSIS — S8992XA Unspecified injury of left lower leg, initial encounter: Secondary | ICD-10-CM | POA: Insufficient documentation

## 2016-02-28 NOTE — Progress Notes (Signed)
PCP: Danise EdgeBLYTH, STACEY, MD  Subjective:   HPI: Patient is a 35 y.o. male here for left knee pain.  Patient reports his ankle pain is significantly improved. New problem is about 1 month ago he was stepping over a doggie door, tripped and landed directly onto his left knee. Difficulty putting pressure on this leg. Pain level 6/10, sharp. Now with some pain squatting. Radiographs at orthopedic office were negative per report. Has been using pennsaid, 800 ibuprofen, and soaking this. No skin changes, numbness.  Past Medical History:  Diagnosis Date  . Acute upper respiratory infections of unspecified site 04/28/2012  . 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 Constipation 12/18/2013  . Hyperglycemia 09/28/2014  . Hyperlipidemia, mixed 06/22/2014  . Infectious mononucleosis 07/18/2013  . Inguinal hernia 05/12/2012  . Low back pain 12/16/2013  . Overweight 06/22/2014  . Pelvic floor dysfunction   . Premature ventricular beat   . Preventative health care 12/18/2013  . Splenomegaly 07/18/2013  . Tremulousness 08/14/2013    Current Outpatient Prescriptions on File Prior to Visit  Medication Sig Dispense Refill  . tiZANidine (ZANAFLEX) 2 MG tablet Take 1 tablet (2 mg total) by mouth every 8 (eight) hours as needed for muscle spasms. 60 tablet 3   No current facility-administered medications on file prior to visit.     No past surgical history on file.  Allergies  Allergen Reactions  . Guaifenesin     REACTION: PVCs  . Prednisone     PVC    Social History   Social History  . Marital status: Divorced    Spouse name: N/A  . Number of children: N/A  . Years of education: N/A   Occupational History  . Not on file.   Social History Main Topics  . Smoking status: Never Smoker  . Smokeless tobacco: Never Used  . Alcohol use No  . Drug use: No  . 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 Not on file   Social History Narrative  . No narrative on file    Family History  Problem Relation Age of Onset  . Cancer Mother     Non Hodkins lymphoma, breast cancer  . Other Father     pituatary adenoma  . Cancer Paternal Uncle     recurrent prostate   . Heart disease Maternal Grandmother     chf  . Heart disease Maternal Grandfather     chf  . Heart disease Paternal Grandfather     chf  . Anemia Sister     BP 111/78   Pulse 65   Ht 5\' 7"  (1.702 m)   Wt 185 lb (83.9 kg)   BMI 28.98 kg/m   Review of Systems: See HPI above.     Objective:  Physical Exam:  Gen: NAD, comfortable in exam room  Left knee: No gross deformity, ecchymoses, swelling. TTP patellar tendon, over patella.  No joint line or other tenderness. FROM with mild pain on extension. Negative ant/post drawers. Negative valgus/varus testing. Negative lachmanns. Negative mcmurrays, apleys, patellar apprehension. NV intact distally.  Right knee: FROM without pain.   Assessment & Plan:  1. Left knee injury - radiographs reportedly normal.  MSK u/s at bedside reassuring - patellar tendon looks normal and no cortical irregularities of patella.  Consistent with knee contusion and Grade 1 patellar tendon strain.  Icing, patellar tendon  strap, reviewed home exercises to do daily.  Ibuprofen 800 tid.  F/u in 6 weeks.

## 2016-02-28 NOTE — Assessment & Plan Note (Signed)
radiographs reportedly normal.  MSK u/s at bedside reassuring - patellar tendon looks normal and no cortical irregularities of patella.  Consistent with knee contusion and Grade 1 patellar tendon strain.  Icing, patellar tendon strap, reviewed home exercises to do daily.  Ibuprofen 800 tid.  F/u in 6 weeks.

## 2016-03-13 MED FILL — tiZANidine HCL 2 MG TABS: 2 | 20 days supply | Qty: 60 | Fill #1

## 2016-03-31 ENCOUNTER — Ambulatory Visit (INDEPENDENT_AMBULATORY_CARE_PROVIDER_SITE_OTHER): Payer: No Typology Code available for payment source | Admitting: Family Medicine

## 2016-03-31 ENCOUNTER — Encounter: Payer: Self-pay | Admitting: Family Medicine

## 2016-03-31 DIAGNOSIS — K5900 Constipation, unspecified: Secondary | ICD-10-CM | POA: Diagnosis not present

## 2016-03-31 DIAGNOSIS — M6289 Other specified disorders of muscle: Secondary | ICD-10-CM

## 2016-03-31 MED ORDER — BACLOFEN 10 MG PO TABS: 5.0000 mg | ORAL_TABLET | Freq: Three times a day (TID) | ORAL | 0 refills | Status: DC | PRN

## 2016-03-31 NOTE — Progress Notes (Signed)
Pre visit review using our clinic review tool, if applicable. No additional management support is needed unless otherwise documented below in the visit note. 

## 2016-03-31 NOTE — Progress Notes (Signed)
Subjective:    Patient ID: Jared Myers, male    DOB: [birth year], 36 y.o.   MRN: [id number]  Chief Complaint  Patient presents with  . Medication Problem    HPI Patient is in today for evaluation of worsening pelvic pain, his pelvic floor dysfunction tens to escalate when he is under increased stress. He and his wife are struggling with trying to conceive and his job has gotten more stressful again. Jared Myers is noting some increase in constipation, discomfort with urinating, fatigue and headaches as well as some nausea since stress increased. His previous muscle relaxer is not helping as much any longer. Denies CP/palp/SOB/congestion/fevers. Taking meds as prescribed  Past Medical History:  Diagnosis Date  . Acute upper respiratory infections of unspecified site 04/28/2012  . 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 Constipation 12/18/2013  . Hyperglycemia 09/28/2014  . Hyperlipidemia, mixed 06/22/2014  . Infectious mononucleosis 07/18/2013  . Inguinal hernia 05/12/2012  . Low back pain 12/16/2013  . Overweight 06/22/2014  . Pelvic floor dysfunction   . Premature ventricular beat   . Preventative health care 12/18/2013  . Splenomegaly 07/18/2013  . Tremulousness 08/14/2013    No past surgical history on file.  Family History  Problem Relation Age of Onset  . Cancer Mother     Non Hodkins lymphoma, breast cancer  . Other Father     pituatary adenoma  . Cancer Paternal Uncle     recurrent prostate   . Heart disease Maternal Grandmother     chf  . Heart disease Maternal Grandfather     chf  . Heart disease Paternal Grandfather     chf  . Anemia Sister     Social History   Social History  . Marital status: Divorced    Spouse name: N/A  . Number of children: N/A  . Years of education: N/A   Occupational History  . Not on file.   Social History Main Topics  . Smoking status: Never Smoker  . Smokeless tobacco: Never Used  . Alcohol use No  . Drug use: No  . 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 Not on file   Social History Narrative  . No narrative on file    Outpatient Medications Prior to Visit  Medication Sig Dispense Refill  . tiZANidine (ZANAFLEX) 2 MG tablet Take 1 tablet (2 mg total) by mouth every 8 (eight) hours as needed for muscle spasms. 60 tablet 3  . DUEXIS 800-26.6 MG TABS     . PENNSAID 2 % SOLN APPLY 1-2 PUMPS TOPICALLY TO THE AFFECTED AREA TWICE DAILY  2   No facility-administered medications prior to visit.     Allergies  Allergen Reactions  . Guaifenesin     REACTION: PVCs  . Prednisone     PVC    Review of Systems  Constitutional: Positive for malaise/fatigue. Negative for fever.  HENT: Negative for congestion.   Eyes: Negative for blurred vision.  Respiratory: Negative for cough and shortness of breath.   Cardiovascular: Negative for chest pain, palpitations and leg swelling.  Gastrointestinal: Positive for abdominal pain, constipation and nausea. Negative for blood in stool and vomiting.  Genitourinary: Positive for dysuria and frequency.  Musculoskeletal: Negative for back pain.  Skin: Negative for rash.  Neurological: Positive for headaches. Negative for loss of consciousness.  Objective:    Physical Exam  Constitutional: He is oriented to person, place, and time. He appears well-developed and well-nourished. No distress.  HENT:  Head: Normocephalic and atraumatic.  Eyes: Conjunctivae are normal.  Neck: Normal range of motion. No thyromegaly present.  Cardiovascular: Normal rate and regular rhythm.   Pulmonary/Chest: Effort normal and breath sounds normal. He has no wheezes.  Abdominal: Soft. Bowel sounds are normal. There is no tenderness.  Musculoskeletal: Normal range of motion. He exhibits no edema or deformity.  Neurological: He is alert and oriented to person, place, and time.  Skin:  Skin is warm and dry. He is not diaphoretic.  Psychiatric: He has a normal mood and affect.    BP 125/90 (BP Location: Left Arm, Patient Position: Sitting, Cuff Size: Normal)   Pulse 77   Temp 98.1 F (36.7 C) (Oral)   Wt 188 lb (85.3 kg)   SpO2 100%   BMI 29.44 kg/m  Wt Readings from Last 3 Encounters:  03/31/16 188 lb (85.3 kg)  02/27/16 185 lb (83.9 kg)  01/17/16 186 lb (84.4 kg)     Lab Results  Component Value Date   WBC 5.7 10/09/2015   HGB 14.7 10/09/2015   HCT 42.5 10/09/2015   PLT 258.0 10/09/2015   GLUCOSE 85 10/09/2015   CHOL 177 10/09/2015   TRIG 131.0 10/09/2015   HDL 41.10 10/09/2015   LDLCALC 110 (H) 10/09/2015   ALT 20 10/09/2015   AST 18 10/09/2015   NA 139 10/09/2015   K 3.9 10/09/2015   CL 103 10/09/2015   CREATININE 0.96 10/09/2015   BUN 14 10/09/2015   CO2 27 10/09/2015   TSH 0.98 10/09/2015   PSA 0.52 01/24/2014   HGBA1C 4.9 09/28/2014    Lab Results  Component Value Date   TSH 0.98 10/09/2015   Lab Results  Component Value Date   WBC 5.7 10/09/2015   HGB 14.7 10/09/2015   HCT 42.5 10/09/2015   MCV 87.5 10/09/2015   PLT 258.0 10/09/2015   Lab Results  Component Value Date   NA 139 10/09/2015   K 3.9 10/09/2015   CO2 27 10/09/2015   GLUCOSE 85 10/09/2015   BUN 14 10/09/2015   CREATININE 0.96 10/09/2015   BILITOT 0.4 10/09/2015   ALKPHOS 58 10/09/2015   AST 18 10/09/2015   ALT 20 10/09/2015   PROT 7.3 10/09/2015   ALBUMIN 4.4 10/09/2015   CALCIUM 9.5 10/09/2015   ANIONGAP 7 06/06/2014   GFR 94.46 10/09/2015   Lab Results  Component Value Date   CHOL 177 10/09/2015   Lab Results  Component Value Date   HDL 41.10 10/09/2015   Lab Results  Component Value Date   LDLCALC 110 (H) 10/09/2015   Lab Results  Component Value Date   TRIG 131.0 10/09/2015   Lab Results  Component Value Date   CHOLHDL 4 10/09/2015   Lab Results  Component Value Date   HGBA1C 4.9 09/28/2014      I acted as a Neurosurgeonscribe for Dr.  Abner GreenspanBlyth. Princess, RMA  Assessment & Plan:   Problem List Items Addressed This Visit    Pelvic floor dysfunction    Recent flare in pain with some increased dysuria and constipation. Is noted to correlate with increase in stress. Will try Baclofen and encouraged to continue with PT      Constipation    Encouraged increased hydration and fiber in diet. Daily probiotics. If bowels not moving can use MOM 2 tbls po  in 4 oz of warm prune juice by mouth every 2-3 days         I have discontinued Jared Myers tiZANidine. I am also having him start on baclofen. Additionally, I am having him maintain his PENNSAID and DUEXIS.  Meds ordered this encounter  Medications  . baclofen (LIORESAL) 10 MG tablet    Sig: Take 0.5-2 tablets (5-20 mg total) by mouth 3 (three) times daily as needed for muscle spasms.    Dispense:  30 each    Refill:  0    CMA served as scribe during this visit. History, Physical and Plan performed by medical provider. Documentation and orders reviewed and attested to.  Danise Edge, MD

## 2016-03-31 NOTE — Patient Instructions (Signed)
NOW probiotic daily, 64 oz clear Constipation, Adult Constipation is when a person has fewer bowel movements in a week than normal, has difficulty having a bowel movement, or has stools that are dry, hard, or larger than normal. Constipation may be caused by an underlying condition. It may become worse with age if a person takes certain medicines and does not take in enough fluids. Follow these instructions at home: Eating and drinking  Eat foods that have a lot of fiber, such as fresh fruits and vegetables, whole grains, and beans.  Limit foods that are high in fat, low in fiber, or overly processed, such as french fries, hamburgers, cookies, candies, and soda.  Drink enough fluid to keep your urine clear or pale yellow. General instructions  Exercise regularly or as told by your health care provider.  Go to the restroom when you have the urge to go. Do not hold it in.  Take over-the-counter and prescription medicines only as told by your health care provider. These include any fiber supplements.  Practice pelvic floor retraining exercises, such as deep breathing while relaxing the lower abdomen and pelvic floor relaxation during bowel movements.  Watch your condition for any changes.  Keep all follow-up visits as told by your health care provider. This is important. Contact a health care provider if:  You have pain that gets worse.  You have a fever.  You do not have a bowel movement after 4 days.  You vomit.  You are not hungry.  You lose weight.  You are bleeding from the anus.  You have thin, pencil-like stools. Get help right away if:  You have a fever and your symptoms suddenly get worse.  You leak stool or have blood in your stool.  Your abdomen is bloated.  You have severe pain in your abdomen.  You feel dizzy or you faint. This information is not intended to replace advice given to you by your health care provider. Make sure you discuss any questions you  have with your health care provider. Document Released: 11/23/2003 Document Revised: 09/14/2015 Document Reviewed: 08/15/2015 Elsevier Interactive Patient Education  2017 [employer]  fluids, fiber benefiber or Metamucil twice daily

## 2016-04-01 ENCOUNTER — Encounter: Payer: Self-pay | Admitting: Family Medicine

## 2016-04-01 ENCOUNTER — Telehealth: Payer: Self-pay | Admitting: Family Medicine

## 2016-04-01 NOTE — Telephone Encounter (Signed)
Caller name: Ivin BootyJoshua Relation to pt: self Call back number: [phone]  Pharmacy:  Reason for call: Pt called stating was seen yesterday (03-31-16) with provider and that was given a letter to excuse pt for January 21 and 22, 2018 from work and that he can return to work today with no restriction. Pt states he already has restriction for work and that provider already knows this information but pt is needing to get a new work excuse letter just only stating that was out those two days but for the letter not to mention anything else. (do not include the restriction on letter). Pt states will pick up letter today in the afternoon. Please advise.

## 2016-04-01 NOTE — Telephone Encounter (Signed)
Please rewrite letter to state can return to work today and just leave out comment about restrictions.

## 2016-04-01 NOTE — Telephone Encounter (Signed)
Patient informed letter corrected and at the front ready for pickup

## 2016-04-02 NOTE — Assessment & Plan Note (Addendum)
Encouraged increased hydration and fiber in diet. Daily probiotics. If bowels not moving can use MOM 2 tbls po in 4 oz of warm prune juice by mouth every 2-3 days.  

## 2016-04-02 NOTE — Assessment & Plan Note (Signed)
Recent flare in pain with some increased dysuria and constipation. Is noted to correlate with increase in stress. Will try Baclofen and encouraged to continue with PT

## 2016-04-09 ENCOUNTER — Ambulatory Visit: Payer: No Typology Code available for payment source | Admitting: Family Medicine

## 2016-04-11 ENCOUNTER — Telehealth: Payer: Self-pay | Admitting: Family Medicine

## 2016-04-11 MED ORDER — TIZANIDINE HCL 4 MG PO CAPS: 4.0000 mg | ORAL_CAPSULE | Freq: Three times a day (TID) | ORAL | 3 refills | Status: DC | PRN

## 2016-04-11 MED FILL — tiZANidine HCL 4 MG TABS: 4 | 30 days supply | Qty: 60 | Fill #0

## 2016-04-11 NOTE — Telephone Encounter (Signed)
Updated medication list/sent in tizanidine to [facility] pharmacy. Called the patient, mailbox full. Then called his wife informed her of prescription sent in to the pharmacy. She will inform the patient.

## 2016-04-11 NOTE — Telephone Encounter (Signed)
OK d/c Baclofen and send in Tizanidine 4 mg tabs, 1 tab po tid prn muscle spasm, disp #60 with 3 rf

## 2016-04-11 NOTE — Telephone Encounter (Signed)
°  Relation to ZO:XWRUpt:self Call back [phone] Pharmacy: [facility]  Reason for call: pt states per last ov, dr. Abner Greenspanblyth informed him to make her aware that if doubling up on his rx tizanidine help him at all, pt states it does help however he will need a new rx written since he is taking 2 now. Pt states he will call back to verify rx has been sent because he works for the [employer] and is unable to answer the phone. States rx needs to be changed to 4 mg verses 2 mg.

## 2016-04-17 ENCOUNTER — Encounter (INDEPENDENT_AMBULATORY_CARE_PROVIDER_SITE_OTHER): Payer: Self-pay

## 2016-04-17 ENCOUNTER — Encounter: Payer: Self-pay | Admitting: Family Medicine

## 2016-04-17 ENCOUNTER — Ambulatory Visit (INDEPENDENT_AMBULATORY_CARE_PROVIDER_SITE_OTHER): Payer: No Typology Code available for payment source | Admitting: Family Medicine

## 2016-04-17 DIAGNOSIS — K5900 Constipation, unspecified: Secondary | ICD-10-CM | POA: Diagnosis not present

## 2016-04-17 DIAGNOSIS — M6289 Other specified disorders of muscle: Secondary | ICD-10-CM

## 2016-04-17 NOTE — Progress Notes (Signed)
Pre visit review using our clinic review tool, if applicable. No additional management support is needed unless otherwise documented below in the visit note. 

## 2016-04-17 NOTE — Patient Instructions (Addendum)
Metamucil Daily in the morning or night Probiotics Daily  64ounces of water daily    Constipation, Adult Constipation is when a person has fewer bowel movements in a week than normal, has difficulty having a bowel movement, or has stools that are dry, hard, or larger than normal. Constipation may be caused by an underlying condition. It may become worse with age if a person takes certain medicines and does not take in enough fluids. Follow these instructions at home: Eating and drinking  Eat foods that have a lot of fiber, such as fresh fruits and vegetables, whole grains, and beans.  Limit foods that are high in fat, low in fiber, or overly processed, such as french fries, hamburgers, cookies, candies, and soda.  Drink enough fluid to keep your urine clear or pale yellow. General instructions  Exercise regularly or as told by your health care provider.  Go to the restroom when you have the urge to go. Do not hold it in.  Take over-the-counter and prescription medicines only as told by your health care provider. These include any fiber supplements.  Practice pelvic floor retraining exercises, such as deep breathing while relaxing the lower abdomen and pelvic floor relaxation during bowel movements.  Watch your condition for any changes.  Keep all follow-up visits as told by your health care provider. This is important. Contact a health care provider if:  You have pain that gets worse.  You have a fever.  You do not have a bowel movement after 4 days.  You vomit.  You are not hungry.  You lose weight.  You are bleeding from the anus.  You have thin, pencil-like stools. Get help right away if:  You have a fever and your symptoms suddenly get worse.  You leak stool or have blood in your stool.  Your abdomen is bloated.  You have severe pain in your abdomen.  You feel dizzy or you faint. This information is not intended to replace advice given to you by your health  care provider. Make sure you discuss any questions you have with your health care provider. Document Released: 11/23/2003 Document Revised: 09/14/2015 Document Reviewed: 08/15/2015 Elsevier Interactive Patient Education  2017 [employer]

## 2016-04-17 NOTE — Progress Notes (Signed)
Patient ID: Jared Myers, male   DOB: [birth year], 36 y.o.   MRN: [id number]   Subjective:    Patient ID: Jared Myers, male    DOB: September 06, 1980, 36 y.o.   MRN: [id number]  Chief Complaint  Patient presents with  . Follow-up    Pelvic floor pain  I acted as a Neurosurgeon for Dr. Abner Greenspan. [place], RMA   HPI  Patient is in today for follow up on pelvic floor pain patient states he is doing much better since he started the Zanaflex 4 mg dose. He has also switched jobs within his workplace and that has helped his pelvic floor and lower abdominal pain as well. Was struggling with mild constipation and he still has some but it is greatly improved. No bloody or tarry stool. Denies CP/palp/SOB/HA/congestion/fevers. Taking meds as prescribed  Past Medical History:  Diagnosis Date  . Acute upper respiratory infections of unspecified site 04/28/2012  . 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 Constipation 12/18/2013  . Hyperglycemia 09/28/2014  . Hyperlipidemia, mixed 06/22/2014  . Infectious mononucleosis 07/18/2013  . Inguinal hernia 05/12/2012  . Low back pain 12/16/2013  . Overweight 06/22/2014  . Pelvic floor dysfunction   . Premature ventricular beat   . Preventative health care 12/18/2013  . Splenomegaly 07/18/2013  . Tremulousness 08/14/2013    History reviewed. No pertinent surgical history.  Family History  Problem Relation Age of Onset  . Cancer Mother     Non Hodkins lymphoma, breast cancer  . Other Father     pituatary adenoma  . Cancer Paternal Uncle     recurrent prostate   . Heart disease Maternal Grandmother     chf  . Heart disease Maternal Grandfather     chf  . Heart disease Paternal Grandfather     chf  . Anemia Sister     Social History   Social History  . Marital status: Divorced    Spouse name: N/A  . Number of children: N/A  . Years of education: N/A   Occupational History  . Not on file.   Social History Main Topics  . Smoking status: Never  Smoker  . Smokeless tobacco: Never Used  . Alcohol use No  . Drug use: No  . Sexual activity: Yes     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, works at post [employer], 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   Other Topics Concern  . Not on file   Social History Narrative  . No narrative on file    Outpatient Medications Prior to Visit  Medication Sig Dispense Refill  . tiZANidine (ZANAFLEX) 4 MG capsule Take 1 capsule (4 mg total) by mouth 3 (three) times daily as needed for muscle spasms. 60 capsule 3  . DUEXIS 800-26.6 MG TABS     . PENNSAID 2 % SOLN APPLY 1-2 PUMPS TOPICALLY TO THE AFFECTED AREA TWICE DAILY  2   No facility-administered medications prior to visit.     Allergies  Allergen Reactions  . Guaifenesin     REACTION: PVCs  . Prednisone     PVC    Review of Systems  Constitutional: Negative for fever and malaise/fatigue.  HENT: Negative for congestion.   Eyes: Negative for blurred vision.  Respiratory: Negative for cough and shortness of breath.   Cardiovascular: Negative for chest pain, palpitations and leg swelling.  Gastrointestinal: Positive for abdominal pain and constipation. Negative for vomiting.  Genitourinary: Negative for dysuria and frequency.  Musculoskeletal: Negative for back  pain.  Skin: Negative for rash.  Neurological: Negative for loss of consciousness and headaches.       Objective:    Physical Exam  Constitutional: He is oriented to person, place, and time. He appears well-developed and well-nourished. No distress.  HENT:  Head: Normocephalic and atraumatic.  Eyes: Conjunctivae are normal.  Neck: Normal range of motion. No thyromegaly present.  Cardiovascular: Normal rate and regular rhythm.   Pulmonary/Chest: Effort normal and breath sounds normal. He has no wheezes.  Abdominal: Soft. Bowel sounds are normal. There is no tenderness.  Musculoskeletal: Normal range of motion. He exhibits no edema or deformity.  Neurological: He is alert and  oriented to person, place, and time.  Skin: Skin is warm and dry. He is not diaphoretic.  Psychiatric: He has a normal mood and affect.    BP 98/78 (BP Location: Left Arm, Patient Position: Sitting, Cuff Size: Normal)   Pulse 82   Temp 97.9 F (36.6 C) (Oral)   Wt 189 lb 9.6 oz (86 kg)   SpO2 97%   BMI 29.70 kg/m  Wt Readings from Last 3 Encounters:  04/17/16 189 lb 9.6 oz (86 kg)  03/31/16 188 lb (85.3 kg)  02/27/16 185 lb (83.9 kg)     Lab Results  Component Value Date   WBC 5.7 10/09/2015   HGB 14.7 10/09/2015   HCT 42.5 10/09/2015   PLT 258.0 10/09/2015   GLUCOSE 85 10/09/2015   CHOL 177 10/09/2015   TRIG 131.0 10/09/2015   HDL 41.10 10/09/2015   LDLCALC 110 (H) 10/09/2015   ALT 20 10/09/2015   AST 18 10/09/2015   NA 139 10/09/2015   K 3.9 10/09/2015   CL 103 10/09/2015   CREATININE 0.96 10/09/2015   BUN 14 10/09/2015   CO2 27 10/09/2015   TSH 0.98 10/09/2015   PSA 0.52 01/24/2014   HGBA1C 4.9 09/28/2014    Lab Results  Component Value Date   TSH 0.98 10/09/2015   Lab Results  Component Value Date   WBC 5.7 10/09/2015   HGB 14.7 10/09/2015   HCT 42.5 10/09/2015   MCV 87.5 10/09/2015   PLT 258.0 10/09/2015   Lab Results  Component Value Date   NA 139 10/09/2015   K 3.9 10/09/2015   CO2 27 10/09/2015   GLUCOSE 85 10/09/2015   BUN 14 10/09/2015   CREATININE 0.96 10/09/2015   BILITOT 0.4 10/09/2015   ALKPHOS 58 10/09/2015   AST 18 10/09/2015   ALT 20 10/09/2015   PROT 7.3 10/09/2015   ALBUMIN 4.4 10/09/2015   CALCIUM 9.5 10/09/2015   ANIONGAP 7 06/06/2014   GFR 94.46 10/09/2015   Lab Results  Component Value Date   CHOL 177 10/09/2015   Lab Results  Component Value Date   HDL 41.10 10/09/2015   Lab Results  Component Value Date   LDLCALC 110 (H) 10/09/2015   Lab Results  Component Value Date   TRIG 131.0 10/09/2015   Lab Results  Component Value Date   CHOLHDL 4 10/09/2015   Lab Results  Component Value Date   HGBA1C 4.9  09/28/2014       Assessment & Plan:   Problem List Items Addressed This Visit    Pelvic floor dysfunction    Has switched jobs at work temporarily and is doing better, 4 mg Zanaflex is helpful using less frequently      Constipation    Encouraged increased hydration and fiber in diet. Daily probiotics. If bowels not moving  can use MOM 2 tbls po in 4 oz of warm prune juice by mouth every 2-3 days. If no results then repeat in 4 hours with  Dulcolax suppository pr, may repeat again in 4 more hours as needed. Seek care if symptoms worsen. Consider daily Miralax and/or Dulcolax if symptoms persist. Better but still has to strain at times         I am having Mr. Pitner maintain his PENNSAID, DUEXIS, and tiZANidine.  No orders of the defined types were placed in this encounter.   .team Danise Edge, MD

## 2016-04-17 NOTE — Assessment & Plan Note (Addendum)
Encouraged increased hydration and fiber in diet. Daily probiotics. If bowels not moving can use MOM 2 tbls po in 4 oz of warm prune juice by mouth every 2-3 days. If no results then repeat in 4 hours with  Dulcolax suppository pr, may repeat again in 4 more hours as needed. Seek care if symptoms worsen. Consider daily Miralax and/or Dulcolax if symptoms persist. Better but still has to strain at times

## 2016-04-17 NOTE — Assessment & Plan Note (Signed)
Has switched jobs at work temporarily and is doing better, 4 mg Zanaflex is helpful using less frequently

## 2016-06-06 MED FILL — tiZANidine HCL 4 MG TABS: 4 | 30 days supply | Qty: 60 | Fill #1

## 2016-07-15 ENCOUNTER — Ambulatory Visit (INDEPENDENT_AMBULATORY_CARE_PROVIDER_SITE_OTHER): Payer: No Typology Code available for payment source | Admitting: Family Medicine

## 2016-07-15 ENCOUNTER — Encounter: Payer: Self-pay | Admitting: Family Medicine

## 2016-07-15 DIAGNOSIS — R739 Hyperglycemia, unspecified: Secondary | ICD-10-CM | POA: Diagnosis not present

## 2016-07-15 DIAGNOSIS — E663 Overweight: Secondary | ICD-10-CM | POA: Diagnosis not present

## 2016-07-15 DIAGNOSIS — M6289 Other specified disorders of muscle: Secondary | ICD-10-CM

## 2016-07-15 MED FILL — tiZANidine HCL 4 MG TABS: 4 | 30 days supply | Qty: 60 | Fill #2

## 2016-07-15 NOTE — Progress Notes (Signed)
Pre visit review using our clinic review tool, if applicable. No additional management support is needed unless otherwise documented below in the visit note. 

## 2016-07-15 NOTE — Progress Notes (Signed)
Subjective:  I acted as a Neurosurgeon for Dr. Abner Greenspan. [place], [place]   Patient ID: Jared Myers, male    DOB: [birth year], 36 y.o.   MRN: [id number]  Chief Complaint  Patient presents with  . Follow-up    HPI  Patient is in today for a 3 month follow up.Patient expecting his first baby. He is following up on his constipation and pelvic floor pain and other other medical concerns. Patient states that he has began to titrate down on the tizandine. He states the work restrictions that he's currently on are helping with the pain. No recent febrile illness or hospitalizations. Poorly controlled will alter medications, encouraged DASH diet, minimize caffeine and obtain adequate sleep. Report concerning symptoms and follow up as directed and as needed  Patient Care Team: Bradd Canary, MD as PCP - General (Family Medicine)   Past Medical History:  Diagnosis Date  . Acute upper respiratory infections of unspecified site 04/28/2012  . 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 Constipation 12/18/2013  . Hyperglycemia 09/28/2014  . Hyperlipidemia, mixed 06/22/2014  . Infectious mononucleosis 07/18/2013  . Inguinal hernia 05/12/2012  . Low back pain 12/16/2013  . Overweight 06/22/2014  . Pelvic floor dysfunction   . Premature ventricular beat   . Preventative health care 12/18/2013  . Splenomegaly 07/18/2013  . Tremulousness 08/14/2013    No past surgical history on file.  Family History  Problem Relation Age of Onset  . Cancer Mother     Non Hodkins lymphoma, breast cancer  . Other Father     pituatary adenoma  . Cancer Paternal Uncle     recurrent prostate   . Heart disease Maternal Grandmother     chf  . Heart disease Maternal Grandfather     chf  . Heart disease Paternal Grandfather     chf  . Anemia Sister     Social History   Social History  . Marital status: Divorced    Spouse name: N/A  . Number of children: N/A  . Years of education: N/A   Occupational History  .  Not on file.   Social History Main Topics  . Smoking status: Never Smoker  . Smokeless tobacco: Never Used  . Alcohol use No  . Drug use: No  . Sexual activity: Yes     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, works at [employer], 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   Other Topics Concern  . Not on file   Social History Narrative   New baby on the way 07/15/16   Wife is 13 weeks    Outpatient Medications Prior to Visit  Medication Sig Dispense Refill  . DUEXIS 800-26.6 MG TABS     . PENNSAID 2 % SOLN APPLY 1-2 PUMPS TOPICALLY TO THE AFFECTED AREA TWICE DAILY  2  . tiZANidine (ZANAFLEX) 4 MG capsule Take 1 capsule (4 mg total) by mouth 3 (three) times daily as needed for muscle spasms. 60 capsule 3   No facility-administered medications prior to visit.     Allergies  Allergen Reactions  . Guaifenesin     REACTION: PVCs  . Prednisone     PVC    Review of Systems  Constitutional: Negative for fever and malaise/fatigue.  HENT: Negative for congestion.   Eyes: Negative for blurred vision.  Respiratory: Negative for shortness of breath.   Cardiovascular: Negative for chest pain, palpitations and leg swelling.  Gastrointestinal: Negative for abdominal pain, blood in stool and nausea.  Genitourinary: Negative for dysuria and frequency.  Musculoskeletal: Negative for falls.  Skin: Negative for rash.  Neurological: Negative for dizziness, loss of consciousness and headaches.  Endo/Heme/Allergies: Negative for environmental allergies.  Psychiatric/Behavioral: Negative for depression. The patient is not nervous/anxious.        Objective:    Physical Exam  Constitutional: He is oriented to person, place, and time. He appears well-developed and well-nourished. No distress.  HENT:  Head: Normocephalic and atraumatic.  Nose: Nose normal.  Eyes: Right eye exhibits no discharge. Left eye exhibits no discharge.  Neck: Normal range of motion. Neck supple.  Cardiovascular: Normal rate  and regular rhythm.   No murmur heard. Pulmonary/Chest: Effort normal and breath sounds normal.  Abdominal: Soft. Bowel sounds are normal. There is no tenderness.  Musculoskeletal: He exhibits no edema.  Neurological: He is alert and oriented to person, place, and time.  Skin: Skin is warm and dry.  Psychiatric: He has a normal mood and affect.  Nursing note and vitals reviewed.   BP 118/76 (BP Location: Left Arm, Patient Position: Sitting, Cuff Size: Normal)   Pulse 76   Temp 98.1 F (36.7 C) (Oral)   Resp 18   Wt 192 lb (87.1 kg)   SpO2 98%   BMI 30.07 kg/m  Wt Readings from Last 3 Encounters:  07/15/16 192 lb (87.1 kg)  04/17/16 189 lb 9.6 oz (86 kg)  03/31/16 188 lb (85.3 kg)   BP Readings from Last 3 Encounters:  07/15/16 118/76  04/17/16 98/78  03/31/16 125/90     Immunization History  Administered Date(s) Administered  . Influenza Whole 11/18/2010  . Influenza,inj,Quad PF,36+ Mos 11/22/2012, 12/16/2013, 01/02/2015, 01/15/2016  . Td 07/12/2009    Health Maintenance  Topic Date Due  . HIV Screening  04/09/1995  . INFLUENZA VACCINE  10/08/2016  . TETANUS/TDAP  07/13/2019    Lab Results  Component Value Date   WBC 5.7 10/09/2015   HGB 14.7 10/09/2015   HCT 42.5 10/09/2015   PLT 258.0 10/09/2015   GLUCOSE 85 10/09/2015   CHOL 177 10/09/2015   TRIG 131.0 10/09/2015   HDL 41.10 10/09/2015   LDLCALC 110 (H) 10/09/2015   ALT 20 10/09/2015   AST 18 10/09/2015   NA 139 10/09/2015   K 3.9 10/09/2015   CL 103 10/09/2015   CREATININE 0.96 10/09/2015   BUN 14 10/09/2015   CO2 27 10/09/2015   TSH 0.98 10/09/2015   PSA 0.52 01/24/2014   HGBA1C 4.9 09/28/2014    Lab Results  Component Value Date   TSH 0.98 10/09/2015   Lab Results  Component Value Date   WBC 5.7 10/09/2015   HGB 14.7 10/09/2015   HCT 42.5 10/09/2015   MCV 87.5 10/09/2015   PLT 258.0 10/09/2015   Lab Results  Component Value Date   NA 139 10/09/2015   K 3.9 10/09/2015   CO2 27  10/09/2015   GLUCOSE 85 10/09/2015   BUN 14 10/09/2015   CREATININE 0.96 10/09/2015   BILITOT 0.4 10/09/2015   ALKPHOS 58 10/09/2015   AST 18 10/09/2015   ALT 20 10/09/2015   PROT 7.3 10/09/2015   ALBUMIN 4.4 10/09/2015   CALCIUM 9.5 10/09/2015   ANIONGAP 7 06/06/2014   GFR 94.46 10/09/2015   Lab Results  Component Value Date   CHOL 177 10/09/2015   Lab Results  Component Value Date   HDL 41.10 10/09/2015   Lab Results  Component Value Date   LDLCALC 110 (H) 10/09/2015  Lab Results  Component Value Date   TRIG 131.0 10/09/2015   Lab Results  Component Value Date   CHOLHDL 4 10/09/2015   Lab Results  Component Value Date   HGBA1C 4.9 09/28/2014         Assessment & Plan:   Problem List Items Addressed This Visit    Pelvic floor dysfunction    Doing better lately with job change. Still has some pain but it is tolerable most days.      Overweight    Encouraged DASH diet, decrease po intake and increase exercise as tolerated. Needs 7-8 hours of sleep nightly. Avoid trans fats, eat small, frequent meals every 4-5 hours with lean proteins, complex carbs and healthy fats. Minimize simple carbs      Hyperglycemia    minimize simple carbs. Increase exercise as tolerated.          I am having Mr. Avey maintain his PENNSAID, DUEXIS, and tiZANidine.  No orders of the defined types were placed in this encounter.   CMA served as Neurosurgeon during this visit. History, Physical and Plan performed by medical provider. Documentation and orders reviewed and attested to.  Danise Edge, MD

## 2016-07-15 NOTE — Patient Instructions (Signed)
MIND diet  DASH Eating Plan DASH stands for "Dietary Approaches to Stop Hypertension." The DASH eating plan is a healthy eating plan that has been shown to reduce high blood pressure (hypertension). It may also reduce your risk for type 2 diabetes, heart disease, and stroke. The DASH eating plan may also help with weight loss. What are tips for following this plan? General guidelines  Avoid eating more than 2,300 mg (milligrams) of salt (sodium) a day. If you have hypertension, you may need to reduce your sodium intake to 1,500 mg a day.  Limit alcohol intake to no more than 1 drink a day for nonpregnant women and 2 drinks a day for men. One drink equals 12 oz of beer, 5 oz of wine, or 1 oz of hard liquor.  Work with your health care provider to maintain a healthy body weight or to lose weight. Ask what an ideal weight is for you.  Get at least 30 minutes of exercise that causes your heart to beat faster (aerobic exercise) most days of the week. Activities may include walking, swimming, or biking.  Work with your health care provider or diet and nutrition specialist (dietitian) to adjust your eating plan to your individual calorie needs. Reading food labels  Check food labels for the amount of sodium per serving. Choose foods with less than 5 percent of the Daily Value of sodium. Generally, foods with less than 300 mg of sodium per serving fit into this eating plan.  To find whole grains, look for the word "whole" as the first word in the ingredient list. Shopping  Buy products labeled as "low-sodium" or "no salt added."  Buy fresh foods. Avoid canned foods and premade or frozen meals. Cooking  Avoid adding salt when cooking. Use salt-free seasonings or herbs instead of table salt or sea salt. Check with your health care provider or pharmacist before using salt substitutes.  Do not fry foods. Cook foods using healthy methods such as baking, boiling, grilling, and broiling  instead.  Cook with heart-healthy oils, such as olive, canola, soybean, or sunflower oil. Meal planning   Eat a balanced diet that includes: ? 5 or more servings of fruits and vegetables each day. At each meal, try to fill half of your plate with fruits and vegetables. ? Up to 6-8 servings of whole grains each day. ? Less than 6 oz of lean meat, poultry, or fish each day. A 3-oz serving of meat is about the same size as a deck of cards. One egg equals 1 oz. ? 2 servings of low-fat dairy each day. ? A serving of nuts, seeds, or beans 5 times each week. ? Heart-healthy fats. Healthy fats called Omega-3 fatty acids are found in foods such as flaxseeds and coldwater fish, like sardines, salmon, and mackerel.  Limit how much you eat of the following: ? Canned or prepackaged foods. ? Food that is high in trans fat, such as fried foods. ? Food that is high in saturated fat, such as fatty meat. ? Sweets, desserts, sugary drinks, and other foods with added sugar. ? Full-fat dairy products.  Do not salt foods before eating.  Try to eat at least 2 vegetarian meals each week.  Eat more home-cooked food and less restaurant, buffet, and fast food.  When eating at a restaurant, ask that your food be prepared with less salt or no salt, if possible. What foods are recommended? The items listed may not be a complete list. Talk with your   dietitian about what dietary choices are best for you. Grains Whole-grain or whole-wheat bread. Whole-grain or whole-wheat pasta. Brown rice. Oatmeal. Quinoa. Bulgur. Whole-grain and low-sodium cereals. Pita bread. Low-fat, low-sodium crackers. Whole-wheat flour tortillas. Vegetables Fresh or frozen vegetables (raw, steamed, roasted, or grilled). Low-sodium or reduced-sodium tomato and vegetable juice. Low-sodium or reduced-sodium tomato sauce and tomato paste. Low-sodium or reduced-sodium canned vegetables. Fruits All fresh, dried, or frozen fruit. Canned fruit in  natural juice (without added sugar). Meat and other protein foods Skinless chicken or turkey. Ground chicken or turkey. Pork with fat trimmed off. Fish and seafood. Egg whites. Dried beans, peas, or lentils. Unsalted nuts, nut butters, and seeds. Unsalted canned beans. Lean cuts of beef with fat trimmed off. Low-sodium, lean deli meat. Dairy Low-fat (1%) or fat-free (skim) milk. Fat-free, low-fat, or reduced-fat cheeses. Nonfat, low-sodium ricotta or cottage cheese. Low-fat or nonfat yogurt. Low-fat, low-sodium cheese. Fats and oils Soft margarine without trans fats. Vegetable oil. Low-fat, reduced-fat, or light mayonnaise and salad dressings (reduced-sodium). Canola, safflower, olive, soybean, and sunflower oils. Avocado. Seasoning and other foods Herbs. Spices. Seasoning mixes without salt. Unsalted popcorn and pretzels. Fat-free sweets. What foods are not recommended? The items listed may not be a complete list. Talk with your dietitian about what dietary choices are best for you. Grains Baked goods made with fat, such as croissants, muffins, or some breads. Dry pasta or rice meal packs. Vegetables Creamed or fried vegetables. Vegetables in a cheese sauce. Regular canned vegetables (not low-sodium or reduced-sodium). Regular canned tomato sauce and paste (not low-sodium or reduced-sodium). Regular tomato and vegetable juice (not low-sodium or reduced-sodium). Pickles. Olives. Fruits Canned fruit in a light or heavy syrup. Fried fruit. Fruit in cream or butter sauce. Meat and other protein foods Fatty cuts of meat. Ribs. Fried meat. Bacon. Sausage. Bologna and other processed lunch meats. Salami. Fatback. Hotdogs. Bratwurst. Salted nuts and seeds. Canned beans with added salt. Canned or smoked fish. Whole eggs or egg yolks. Chicken or turkey with skin. Dairy Whole or 2% milk, cream, and half-and-half. Whole or full-fat cream cheese. Whole-fat or sweetened yogurt. Full-fat cheese. Nondairy  creamers. Whipped toppings. Processed cheese and cheese spreads. Fats and oils Butter. Stick margarine. Lard. Shortening. Ghee. Bacon fat. Tropical oils, such as coconut, palm kernel, or palm oil. Seasoning and other foods Salted popcorn and pretzels. Onion salt, garlic salt, seasoned salt, table salt, and sea salt. Worcestershire sauce. Tartar sauce. Barbecue sauce. Teriyaki sauce. Soy sauce, including reduced-sodium. Steak sauce. Canned and packaged gravies. Fish sauce. Oyster sauce. Cocktail sauce. Horseradish that you find on the shelf. Ketchup. Mustard. Meat flavorings and tenderizers. Bouillon cubes. Hot sauce and Tabasco sauce. Premade or packaged marinades. Premade or packaged taco seasonings. Relishes. Regular salad dressings. Where to find more information:  National Heart, Lung, and Blood Institute: www.nhlbi.nih.gov  American Heart Association: www.heart.org Summary  The DASH eating plan is a healthy eating plan that has been shown to reduce high blood pressure (hypertension). It may also reduce your risk for type 2 diabetes, heart disease, and stroke.  With the DASH eating plan, you should limit salt (sodium) intake to 2,300 mg a day. If you have hypertension, you may need to reduce your sodium intake to 1,500 mg a day.  When on the DASH eating plan, aim to eat more fresh fruits and vegetables, whole grains, lean proteins, low-fat dairy, and heart-healthy fats.  Work with your health care provider or diet and nutrition specialist (dietitian) to adjust your eating plan   to your individual calorie needs. This information is not intended to replace advice given to you by your health care provider. Make sure you discuss any questions you have with your health care provider. Document Released: 02/13/2011 Document Revised: 02/18/2016 Document Reviewed: 02/18/2016 Elsevier Interactive Patient Education  2017 Elsevier Inc.  

## 2016-07-16 NOTE — Assessment & Plan Note (Signed)
Doing better lately with job change. Still has some pain but it is tolerable most days.

## 2016-07-16 NOTE — Assessment & Plan Note (Signed)
Encouraged DASH diet, decrease po intake and increase exercise as tolerated. Needs 7-8 hours of sleep nightly. Avoid trans fats, eat small, frequent meals every 4-5 hours with lean proteins, complex carbs and healthy fats. Minimize simple carbs 

## 2016-07-16 NOTE — Assessment & Plan Note (Signed)
minimize simple carbs. Increase exercise as tolerated.  

## 2016-07-24 MED FILL — HYDROCODON-APAP 5-325: 5-325 | 2 days supply | Qty: 10 | Fill #0

## 2016-09-12 ENCOUNTER — Ambulatory Visit (INDEPENDENT_AMBULATORY_CARE_PROVIDER_SITE_OTHER): Payer: No Typology Code available for payment source | Admitting: Family Medicine

## 2016-09-12 ENCOUNTER — Encounter: Payer: Self-pay | Admitting: Family Medicine

## 2016-09-12 VITALS — BP 120/84 | HR 69 | Temp 98.1°F | Ht 67.0 in | Wt 193.2 lb

## 2016-09-12 DIAGNOSIS — R110 Nausea: Secondary | ICD-10-CM

## 2016-09-12 DIAGNOSIS — R51 Headache: Secondary | ICD-10-CM | POA: Diagnosis not present

## 2016-09-12 DIAGNOSIS — J3489 Other specified disorders of nose and nasal sinuses: Secondary | ICD-10-CM | POA: Diagnosis not present

## 2016-09-12 DIAGNOSIS — R519 Headache, unspecified: Secondary | ICD-10-CM

## 2016-09-12 MED ORDER — LEVOCETIRIZINE DIHYDROCHLORIDE 5 MG PO TABS: 5.0000 mg | ORAL_TABLET | Freq: Every evening | ORAL | 0 refills | Status: DC

## 2016-09-12 MED ORDER — ONDANSETRON 4 MG PO TBDP: 4.0000 mg | ORAL_TABLET | Freq: Three times a day (TID) | ORAL | 0 refills | Status: DC | PRN

## 2016-09-12 MED ORDER — FLUTICASONE PROPIONATE 50 MCG/ACT NA SUSP: 2.0000 | Freq: Every day | NASAL | 1 refills | Status: DC

## 2016-09-12 MED ORDER — KETOROLAC TROMETHAMINE 60 MG/2ML IM SOLN
60.0000 mg | Freq: Once | INTRAMUSCULAR | Status: AC
  Administered 2016-09-12: 60 mg via INTRAMUSCULAR

## 2016-09-12 MED FILL — FLUTICASONE PROP 50 MCG SPR: 50 | 30 days supply | Qty: 16 | Fill #0

## 2016-09-12 MED FILL — LEVOCETIRIZINE 5 MG TABLET: 5 | 30 days supply | Qty: 30 | Fill #0

## 2016-09-12 MED FILL — ONDANSETRON ODT 4 MG TABLET: 4 | 7 days supply | Qty: 20 | Fill #0

## 2016-09-12 NOTE — Addendum Note (Signed)
Addended by: Scharlene GlossEWING, Breylen Agyeman B on: 09/12/2016 11:09 AM   Modules accepted: Orders

## 2016-09-12 NOTE — Patient Instructions (Addendum)
Claritin (loratadine), Allegra (fexofenadine), Zyrtec (cetirizine); these are listed in order from weakest to strongest. Generic, and therefore cheaper, options are in the parentheses.   Flonase (fluticasone); nasal spray that is over the counter. 2 sprays each nostril, once daily. Aim towards the same side eye when you spray.  There are available OTC, and the generic versions, which may be cheaper, are in parentheses. Show this to a pharmacist if you have trouble finding any of these items. I called in equivalents of the above in case insurance will cover it.   If any new symptoms arise, please let us know or seek care.  I think your headache is likely related to your sinuses.

## 2016-09-12 NOTE — Progress Notes (Signed)
Pre visit review using our clinic review tool, if applicable. No additional management support is needed unless otherwise documented below in the visit note. 

## 2016-09-12 NOTE — Progress Notes (Signed)
Chief Complaint  Patient presents with  . Headache    6 days    Jared Myers is a 36 y.o. male here for evaluation of headache.  Duration: 6 days Palliation: nothing Provocation: none It has woken him up at night Severity: 6 Quality: sharp Associated symptoms: nausea, nasal congestion Denies: vomiting, sonophobia, photophobia, diplopia, vertigo, rhinorrhea, numbness, tingling or weakness Currently with headache? Yes Therapy to date: Tylenol, muscle relaxant  Past Medical History:  Diagnosis Date  . 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 Constipation 12/18/2013  . Hyperglycemia 09/28/2014  . Hyperlipidemia, mixed 06/22/2014  . Infectious mononucleosis 07/18/2013  . Inguinal hernia 05/12/2012  . Low back pain 12/16/2013  . Overweight 06/22/2014  . Pelvic floor dysfunction   . Premature ventricular beat   . Splenomegaly 07/18/2013  . Tremulousness 08/14/2013   Family History  Problem Relation Age of Onset  . Cancer Mother        Non Hodkins lymphoma, breast cancer  . Other Father        pituatary adenoma  . Cancer Paternal Uncle        recurrent prostate   . Heart disease Maternal Grandmother        chf  . Heart disease Maternal Grandfather        chf  . Heart disease Paternal Grandfather        chf  . Anemia Sister    Current Meds  Medication Sig  . DUEXIS 800-26.6 MG TABS   . PENNSAID 2 % SOLN APPLY 1-2 PUMPS TOPICALLY TO THE AFFECTED AREA TWICE DAILY  . tiZANidine (ZANAFLEX) 4 MG capsule Take 1 capsule (4 mg total) by mouth 3 (three) times daily as needed for muscle spasms.    BP 120/84 (BP Location: Right Arm, Patient Position: Sitting, Cuff Size: Normal)   Pulse 69   Temp 98.1 F (36.7 C) (Oral)   Ht 5\' 7"  (1.702 m)   Wt 193 lb 4 oz (87.7 kg)   SpO2 97%   BMI 30.27 kg/m  General: awake, alert, appearing stated age Eyes: PERRLA, EOMi Nose: +TTP over L frontal sinus Heart: RRR, no murmurs, no bruits Lungs: CTAB, no accessory muscle use Neuro: CN  2-12 intact, no cerebellar signs, DTR's equal and symmetry, no clonus MSK: 5/5 strength throughout, normal gait, no TTP over posterior cervical triangle or paraspinal cervical musculature Psych: Age appropriate judgment and insight, mood and affect normal  Frontal sinus pain - Plan: fluticasone (FLONASE) 50 MCG/ACT nasal spray, levocetirizine (XYZAL) 5 MG tablet  Acute nonintractable headache, unspecified headache type  Nausea - Plan: ondansetron (ZOFRAN ODT) 4 MG disintegrating tablet  Orders as above. Greatest suspicion is from sinus issue 2/2 allergies. Start INCS and antihistamine. Zofran prn. Tylenol prn. Letter for work given stating he can return today if he wishes. Seek care if new symptoms arise. Follow up prn. The patient voiced understanding and agreement to the plan.  Jilda Rocheicholas Paul Doe RunWendling, OhioDO 10:43 AM 09/12/16

## 2016-09-16 MED FILL — CHLORHEXIDINE 0.12% RINSE: 0.12 | 16 days supply | Qty: 473 | Fill #0

## 2016-09-16 MED FILL — AMOX-CLAV 875-125 MG TABLET: 875-125 | 10 days supply | Qty: 20 | Fill #0

## 2016-09-26 MED FILL — AMOX-CLAV 875-125 MG TABLET: 875-125 | 10 days supply | Qty: 20 | Fill #1

## 2016-10-29 MED FILL — tiZANidine HCL 4 MG TABS: 4 | 30 days supply | Qty: 60 | Fill #3

## 2016-12-09 ENCOUNTER — Telehealth: Payer: Self-pay | Admitting: Family Medicine

## 2016-12-09 NOTE — Telephone Encounter (Signed)
Yes set him up for a nurse vist for tdap and flu shot

## 2016-12-09 NOTE — Telephone Encounter (Signed)
Caller name: Relation to ZO:XWRU Call back number:[phone] Pharmacy:  Reason for call: pt is needing to get a tdap shot and would like to get the flu shot as well, pt's wife is expecting , baby due Nov 10th, please advise, states if you get a vm that states someone else name, it is the correct vm for him and a message can be left.

## 2016-12-09 NOTE — Telephone Encounter (Signed)
Please advise 

## 2016-12-25 ENCOUNTER — Ambulatory Visit (INDEPENDENT_AMBULATORY_CARE_PROVIDER_SITE_OTHER): Payer: No Typology Code available for payment source

## 2016-12-25 DIAGNOSIS — Z23 Encounter for immunization: Secondary | ICD-10-CM

## 2016-12-25 NOTE — Telephone Encounter (Signed)
Pt had appt on 12/25/16 °

## 2017-01-06 ENCOUNTER — Encounter: Payer: No Typology Code available for payment source | Admitting: Family Medicine

## 2017-01-06 DIAGNOSIS — Z0289 Encounter for other administrative examinations: Secondary | ICD-10-CM

## 2017-01-12 ENCOUNTER — Other Ambulatory Visit: Payer: Self-pay | Admitting: Family Medicine

## 2017-01-12 MED FILL — tiZANidine HCL 4 MG TABS: 4 | 20 days supply | Qty: 60 | Fill #0

## 2017-02-17 ENCOUNTER — Ambulatory Visit: Payer: No Typology Code available for payment source | Admitting: Family Medicine

## 2017-02-19 ENCOUNTER — Ambulatory Visit: Payer: No Typology Code available for payment source | Admitting: Family Medicine

## 2017-02-19 ENCOUNTER — Encounter: Payer: Self-pay | Admitting: Family Medicine

## 2017-02-19 DIAGNOSIS — R739 Hyperglycemia, unspecified: Secondary | ICD-10-CM | POA: Diagnosis not present

## 2017-02-19 DIAGNOSIS — M6289 Other specified disorders of muscle: Secondary | ICD-10-CM | POA: Diagnosis not present

## 2017-02-19 DIAGNOSIS — E663 Overweight: Secondary | ICD-10-CM | POA: Diagnosis not present

## 2017-02-19 DIAGNOSIS — E782 Mixed hyperlipidemia: Secondary | ICD-10-CM | POA: Diagnosis not present

## 2017-02-19 LAB — TSH: TSH: 0.54 u[IU]/mL (ref 0.35–4.50)

## 2017-02-19 LAB — COMPREHENSIVE METABOLIC PANEL
ALK PHOS: 60 U/L (ref 39–117)
ALT: 21 U/L (ref 0–53)
AST: 17 U/L (ref 0–37)
Albumin: 4.4 g/dL (ref 3.5–5.2)
BILIRUBIN TOTAL: 0.5 mg/dL (ref 0.2–1.2)
BUN: 13 mg/dL (ref 6–23)
CALCIUM: 9.4 mg/dL (ref 8.4–10.5)
CO2: 32 meq/L (ref 19–32)
CREATININE: 0.97 mg/dL (ref 0.40–1.50)
Chloride: 103 mEq/L (ref 96–112)
GFR: 92.63 mL/min (ref >60.00)
GLUCOSE: 104 mg/dL — AB (ref 70–99)
Potassium: 4.3 mEq/L (ref 3.5–5.1)
Sodium: 140 mEq/L (ref 135–145)
TOTAL PROTEIN: 7.3 g/dL (ref 6.0–8.3)

## 2017-02-19 LAB — LIPID PANEL
Cholesterol: 155 mg/dL (ref 0–200)
HDL: 36 mg/dL — AB (ref >39.00)
NONHDL: 119.09
TRIGLYCERIDES: 282 mg/dL — AB (ref 0.0–149.0)
Total CHOL/HDL Ratio: 4
VLDL: 56.4 mg/dL — ABNORMAL HIGH (ref 0.0–40.0)

## 2017-02-19 LAB — HEMOGLOBIN A1C: Hgb A1c MFr Bld: 5.1 % (ref 4.6–6.5)

## 2017-02-19 LAB — LDL CHOLESTEROL, DIRECT: Direct LDL: 100 mg/dL

## 2017-02-19 NOTE — Assessment & Plan Note (Signed)
hgba1c acceptable, minimize simple carbs. Increase exercise as tolerated.  

## 2017-02-19 NOTE — Patient Instructions (Signed)

## 2017-02-19 NOTE — Progress Notes (Signed)
Subjective:  I acted as a Neurosurgeonscribe for Dr. Abner GreenspanBlyth. Princess, [place]  Patient ID: Jared FlakeJoshua A Myers, male    DOB: 04-Feb-1981, 36 y.o.   MRN: [id number]  No chief complaint on file.   HPI  Patient is in today for a follow up and is doing very well. He had his first child 6 weeks ago. He is sleeping well. Denies CP/palp/SOB/HA/congestion/fevers/GI or GU c/o. Taking meds as prescribed. He continues to have trouble with pelvic pain and low back pain at times but tolerable if he avoids heavy lifting and bending.   Patient Care Team: Bradd CanaryBlyth, Azzie Thiem A, MD as PCP - General (Family Medicine)   Past Medical History:  Diagnosis Date  . 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 Constipation 12/18/2013  . Hyperglycemia 09/28/2014  . Hyperlipidemia, mixed 06/22/2014  . Infectious mononucleosis 07/18/2013  . Inguinal hernia 05/12/2012  . Low back pain 12/16/2013  . Overweight 06/22/2014  . Pelvic floor dysfunction   . Premature ventricular beat   . Splenomegaly 07/18/2013  . Tremulousness 08/14/2013    No past surgical history on file.  Family History  Problem Relation Age of Onset  . Cancer Mother        Non Hodkins lymphoma, breast cancer  . Other Father        pituatary adenoma  . Cancer Paternal Uncle        recurrent prostate   . Heart disease Maternal Grandmother        chf  . Heart disease Maternal Grandfather        chf  . Heart disease Paternal Grandfather        chf  . Anemia Sister     Social History   Socioeconomic History  . Marital status: Divorced    Spouse name: Not on file  . Number of children: Not on file  . Years of education: Not on file  . Highest education level: Not on file  Social Needs  . Financial resource strain: Not on file  . Food insecurity - worry: Not on file  . Food insecurity - inability: Not on file  . Transportation needs - medical: Not on file  . Transportation needs - non-medical: Not on file  Occupational History  . Not on file  Tobacco Use   . Smoking status: Never Smoker  . Smokeless tobacco: Never Used  Substance and Sexual Activity  . Alcohol use: No  . Drug use: No  . 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 Not on file  Social History Narrative   New baby on the way 07/15/16   Wife is 13 weeks    Outpatient Medications Prior to Visit  Medication Sig Dispense Refill  . tiZANidine (ZANAFLEX) 4 MG tablet TAKE 1 CAPSULE (4 MG TOTAL) BY MOUTH 3 (THREE) TIMES DAILY AS NEEDED FOR MUSCLE SPASMS. 60 tablet 3  . DUEXIS 800-26.6 MG TABS     . fluticasone (FLONASE) 50 MCG/ACT nasal spray Place 2 sprays into both nostrils daily. (Patient not taking: Reported on 02/19/2017) 16 g 1  . levocetirizine (XYZAL) 5 MG tablet Take 1 tablet (5 mg total) by mouth every evening. (Patient not taking: Reported on 02/19/2017) 30 tablet 0  . ondansetron (ZOFRAN ODT) 4 MG disintegrating tablet Take 1 tablet (4 mg total) by mouth every 8 (eight) hours as needed for nausea or vomiting. (Patient not  taking: Reported on 02/19/2017) 20 tablet 0  . PENNSAID 2 % SOLN APPLY 1-2 PUMPS TOPICALLY TO THE AFFECTED AREA TWICE DAILY  2   No facility-administered medications prior to visit.     Allergies  Allergen Reactions  . Guaifenesin     REACTION: PVCs  . Prednisone     PVC    Review of Systems  Constitutional: Negative for fever and malaise/fatigue.  HENT: Negative for congestion.   Eyes: Negative for blurred vision.  Respiratory: Negative for cough and shortness of breath.   Cardiovascular: Negative for chest pain, palpitations and leg swelling.  Gastrointestinal: Negative for vomiting.  Musculoskeletal: Negative for back pain.  Skin: Negative for rash.  Neurological: Negative for loss of consciousness and headaches.       Objective:    Physical Exam  Constitutional: He is oriented to person, place, and time. He appears well-developed and  well-nourished. No distress.  HENT:  Head: Normocephalic and atraumatic.  Eyes: Conjunctivae are normal.  Neck: Normal range of motion. No thyromegaly present.  Cardiovascular: Normal rate and regular rhythm.  Pulmonary/Chest: Effort normal and breath sounds normal. He has no wheezes.  Abdominal: Soft. Bowel sounds are normal. There is no tenderness.  Musculoskeletal: Normal range of motion. He exhibits no edema or deformity.  Neurological: He is alert and oriented to person, place, and time.  Skin: Skin is warm and dry. He is not diaphoretic.  Psychiatric: He has a normal mood and affect.    BP 118/82 (BP Location: Left Arm, Patient Position: Sitting, Cuff Size: Normal)   Pulse 98   Temp 98.4 F (36.9 C) (Oral)   Resp 18   Wt 198 lb 9.6 oz (90.1 kg)   SpO2 98%   BMI 31.11 kg/m  Wt Readings from Last 3 Encounters:  02/19/17 198 lb 9.6 oz (90.1 kg)  09/12/16 193 lb 4 oz (87.7 kg)  07/15/16 192 lb (87.1 kg)   BP Readings from Last 3 Encounters:  02/19/17 118/82  09/12/16 120/84  07/15/16 118/76     Immunization History  Administered Date(s) Administered  . Influenza Whole 11/18/2010  . Influenza,inj,Quad PF,6+ Mos 11/22/2012, 12/16/2013, 01/02/2015, 01/15/2016, 12/25/2016  . Td 07/12/2009  . Tdap 12/25/2016    Health Maintenance  Topic Date Due  . HIV Screening  04/09/1995  . TETANUS/TDAP  12/26/2026  . INFLUENZA VACCINE  Completed    Lab Results  Component Value Date   WBC 5.7 10/09/2015   HGB 14.7 10/09/2015   HCT 42.5 10/09/2015   PLT 258.0 10/09/2015   GLUCOSE 85 10/09/2015   CHOL 177 10/09/2015   TRIG 131.0 10/09/2015   HDL 41.10 10/09/2015   LDLCALC 110 (H) 10/09/2015   ALT 20 10/09/2015   AST 18 10/09/2015   NA 139 10/09/2015   K 3.9 10/09/2015   CL 103 10/09/2015   CREATININE 0.96 10/09/2015   BUN 14 10/09/2015   CO2 27 10/09/2015   TSH 0.98 10/09/2015   PSA 0.52 01/24/2014   HGBA1C 4.9 09/28/2014    Lab Results  Component Value Date    TSH 0.98 10/09/2015   Lab Results  Component Value Date   WBC 5.7 10/09/2015   HGB 14.7 10/09/2015   HCT 42.5 10/09/2015   MCV 87.5 10/09/2015   PLT 258.0 10/09/2015   Lab Results  Component Value Date   NA 139 10/09/2015   K 3.9 10/09/2015   CO2 27 10/09/2015   GLUCOSE 85 10/09/2015   BUN 14 10/09/2015  CREATININE 0.96 10/09/2015   BILITOT 0.4 10/09/2015   ALKPHOS 58 10/09/2015   AST 18 10/09/2015   ALT 20 10/09/2015   PROT 7.3 10/09/2015   ALBUMIN 4.4 10/09/2015   CALCIUM 9.5 10/09/2015   ANIONGAP 7 06/06/2014   GFR 94.46 10/09/2015   Lab Results  Component Value Date   CHOL 177 10/09/2015   Lab Results  Component Value Date   HDL 41.10 10/09/2015   Lab Results  Component Value Date   LDLCALC 110 (H) 10/09/2015   Lab Results  Component Value Date   TRIG 131.0 10/09/2015   Lab Results  Component Value Date   CHOLHDL 4 10/09/2015   Lab Results  Component Value Date   HGBA1C 4.9 09/28/2014         Assessment & Plan:   Problem List Items Addressed This Visit    Pelvic floor dysfunction    Pain is manageable at the present time.       Hyperlipidemia, mixed    Encouraged heart healthy diet, increase exercise, avoid trans fats, consider a krill oil cap daily      Overweight    Encouraged DASH diet, decrease po intake and increase exercise as tolerated. Needs 7-8 hours of sleep nightly. Avoid trans fats, eat small, frequent meals every 4-5 hours with lean proteins, complex carbs and healthy fats. Minimize simple carbs      Hyperglycemia    hgba1c acceptable, minimize simple carbs. Increase exercise as tolerated.          I am having Jared Myers maintain his PENNSAID, DUEXIS, fluticasone, levocetirizine, ondansetron, and tiZANidine.  No orders of the defined types were placed in this encounter.   CMA served as Neurosurgeon during this visit. History, Physical and Plan performed by medical provider. Documentation and orders reviewed and  attested to.  Danise Edge, MD

## 2017-02-19 NOTE — Assessment & Plan Note (Signed)
Encouraged heart healthy diet, increase exercise, avoid trans fats, consider a krill oil cap daily 

## 2017-02-19 NOTE — Assessment & Plan Note (Signed)
Encouraged DASH diet, decrease po intake and increase exercise as tolerated. Needs 7-8 hours of sleep nightly. Avoid trans fats, eat small, frequent meals every 4-5 hours with lean proteins, complex carbs and healthy fats. Minimize simple carbs 

## 2017-02-19 NOTE — Assessment & Plan Note (Signed)
Pain is manageable at the present time.

## 2017-03-25 MED FILL — tiZANidine HCL 4 MG TABS: 4 | 20 days supply | Qty: 60 | Fill #1

## 2017-03-26 NOTE — Telephone Encounter (Signed)
°  Relation to pt: self Call back number: [phone] (M)   Reason for call:  Patient states thank you for not charging $50 No Show fee but he spoke with billing today and noticed the $50 no show fee was applied, please waive $50 no show fee as per Dr. Abner GreenspanBlyth 01/06/17 Mychart message

## 2017-03-26 NOTE — Telephone Encounter (Signed)
Please Note - No charge for DOS 01/06/17

## 2017-04-16 ENCOUNTER — Ambulatory Visit (INDEPENDENT_AMBULATORY_CARE_PROVIDER_SITE_OTHER): Payer: No Typology Code available for payment source | Admitting: Family Medicine

## 2017-04-16 ENCOUNTER — Encounter: Payer: Self-pay | Admitting: Family Medicine

## 2017-04-16 VITALS — BP 100/72 | HR 80 | Temp 98.1°F | Resp 18 | Ht 67.0 in | Wt 195.6 lb

## 2017-04-16 DIAGNOSIS — R739 Hyperglycemia, unspecified: Secondary | ICD-10-CM | POA: Diagnosis not present

## 2017-04-16 DIAGNOSIS — E782 Mixed hyperlipidemia: Secondary | ICD-10-CM | POA: Diagnosis not present

## 2017-04-16 DIAGNOSIS — M6289 Other specified disorders of muscle: Secondary | ICD-10-CM

## 2017-04-16 DIAGNOSIS — E663 Overweight: Secondary | ICD-10-CM

## 2017-04-16 DIAGNOSIS — Z0000 Encounter for general adult medical examination without abnormal findings: Secondary | ICD-10-CM | POA: Diagnosis not present

## 2017-04-16 DIAGNOSIS — M542 Cervicalgia: Secondary | ICD-10-CM

## 2017-04-16 NOTE — Assessment & Plan Note (Signed)
Encouraged heart healthy diet, increase exercise, avoid trans fats, consider a krill oil cap daily 

## 2017-04-16 NOTE — Assessment & Plan Note (Signed)
Doing somewhat better with chiropractic and massage therapy but has flared since having a new born.

## 2017-04-16 NOTE — Patient Instructions (Addendum)
MegaRed krill oil caps 1 cap daily, Vitamin D 1000 IU daily, probiotic daily by NOW  Cetaphil soap and lotion  Preventive Care 18-39 Years, Male Preventive care refers to lifestyle choices and visits with your health care provider that can promote health and wellness. What does preventive care include?  A yearly physical exam. This is also called an annual well check.  Dental exams once or twice a year.  Routine eye exams. Ask your health care provider how often you should have your eyes checked.  Personal lifestyle choices, including: ? Daily care of your teeth and gums. ? Regular physical activity. ? Eating a healthy diet. ? Avoiding tobacco and drug use. ? Limiting alcohol use. ? Practicing safe sex. What happens during an annual well check? The services and screenings done by your health care provider during your annual well check will depend on your age, overall health, lifestyle risk factors, and family history of disease. Counseling Your health care provider may ask you questions about your:  Alcohol use.  Tobacco use.  Drug use.  Emotional well-being.  Home and relationship well-being.  Sexual activity.  Eating habits.  Work and work Statistician.  Screening You may have the following tests or measurements:  Height, weight, and BMI.  Blood pressure.  Lipid and cholesterol levels. These may be checked every 5 years starting at age 24.  Diabetes screening. This is done by checking your blood sugar (glucose) after you have not eaten for a while (fasting).  Skin check.  Hepatitis C blood test.  Hepatitis B blood test.  Sexually transmitted disease (STD) testing.  Discuss your test results, treatment options, and if necessary, the need for more tests with your health care provider. Vaccines Your health care provider may recommend certain vaccines, such as:  Influenza vaccine. This is recommended every year.  Tetanus, diphtheria, and acellular  pertussis (Tdap, Td) vaccine. You may need a Td booster every 10 years.  Varicella vaccine. You may need this if you have not been vaccinated.  HPV vaccine. If you are 64 or younger, you may need three doses over 6 months.  Measles, mumps, and rubella (MMR) vaccine. You may need at least one dose of MMR.You may also need a second dose.  Pneumococcal 13-valent conjugate (PCV13) vaccine. You may need this if you have certain conditions and have not been vaccinated.  Pneumococcal polysaccharide (PPSV23) vaccine. You may need one or two doses if you smoke cigarettes or if you have certain conditions.  Meningococcal vaccine. One dose is recommended if you are age 23-21 years and a first-year college student living in a residence hall, or if you have one of several medical conditions. You may also need additional booster doses.  Hepatitis A vaccine. You may need this if you have certain conditions or if you travel or work in places where you may be exposed to hepatitis A.  Hepatitis B vaccine. You may need this if you have certain conditions or if you travel or work in places where you may be exposed to hepatitis B.  Haemophilus influenzae type b (Hib) vaccine. You may need this if you have certain risk factors.  Talk to your health care provider about which screenings and vaccines you need and how often you need them. This information is not intended to replace advice given to you by your health care provider. Make sure you discuss any questions you have with your health care provider. Document Released: 04/22/2001 Document Revised: 11/14/2015 Document Reviewed: 12/26/2014 Elsevier  Interactive Patient Education  [employer].

## 2017-04-16 NOTE — Assessment & Plan Note (Signed)
Encouraged DASH diet, decrease po intake and increase exercise as tolerated. Needs 7-8 hours of sleep nightly. Avoid trans fats, eat small, frequent meals every 4-5 hours with lean proteins, complex carbs and healthy fats. Minimize simple carbs 

## 2017-04-16 NOTE — Assessment & Plan Note (Signed)
hgba1c acceptable, minimize simple carbs. Increase exercise as tolerated.  

## 2017-04-16 NOTE — Progress Notes (Signed)
Subjective:  I acted as a Neurosurgeon for Dr. Abner Greenspan. [place], [place]  Patient ID: Jared Myers, male    DOB: [birth year], 37 y.o.   MRN: [id number]  No chief complaint on file.   HPI Patient is in today for an annual exam and follow up on chronic medical concerns including pelvic floor dysfunction, hyperlipidemia, hyperglycemia and more. He has had a flare in neck pain recently. No falls or trauma. His pelvic floor dysfunction has been managed with minimal medication and minor work restrictions. Denies CP/palp/SOB/HA/congestion/fevers/GI or GU c/o. Taking meds as prescribed. He is struggling with fatigue due to a new baby at home but things are going well. He tries to maintain a heart healthy diet and regular exercise.   Patient Care Team: Bradd Canary, MD as PCP - General (Family Medicine)   Past Medical History:  Diagnosis Date  . 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 Constipation 12/18/2013  . Hyperglycemia 09/28/2014  . Hyperlipidemia, mixed 06/22/2014  . Infectious mononucleosis 07/18/2013  . Inguinal hernia 05/12/2012  . Low back pain 12/16/2013  . Overweight 06/22/2014  . Pelvic floor dysfunction   . Premature ventricular beat   . Splenomegaly 07/18/2013  . Tremulousness 08/14/2013    History reviewed. No pertinent surgical history.  Family History  Problem Relation Age of Onset  . Cancer Mother        Non Hodkins lymphoma, breast cancer  . Other Father        pituatary adenoma  . Cancer Paternal Uncle        recurrent prostate   . Heart disease Maternal Grandmother        chf  . Heart disease Maternal Grandfather        chf  . Heart disease Paternal Grandfather        chf  . Depression Brother   . Anemia Sister     Social History   Socioeconomic History  . Marital status: Divorced    Spouse name: Not on file  . Number of children: Not on file  . Years of education: Not on file  . Highest education level: Not on file  Social Needs  . Financial resource  strain: Not on file  . Food insecurity - worry: Not on file  . Food insecurity - inability: Not on file  . Transportation needs - medical: Not on file  . Transportation needs - non-medical: Not on file  Occupational History  . Not on file  Tobacco Use  . Smoking status: Never Smoker  . Smokeless tobacco: Never Used  Substance and Sexual Activity  . Alcohol use: No  . Drug use: No  . 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 Not on file  Social History Narrative   New baby on the way 07/15/16   Wife is 13 weeks    Outpatient Medications Prior to Visit  Medication Sig Dispense Refill  . tiZANidine (ZANAFLEX) 4 MG tablet TAKE 1 CAPSULE (4 MG TOTAL) BY MOUTH 3 (THREE) TIMES DAILY AS NEEDED FOR MUSCLE SPASMS. 60 tablet 3  . DUEXIS 800-26.6 MG TABS     . fluticasone (FLONASE) 50 MCG/ACT nasal spray Place 2 sprays into both nostrils daily. 16 g 1  . levocetirizine (XYZAL) 5 MG tablet Take 1 tablet (5 mg total) by mouth every evening. 30 tablet 0  . ondansetron (ZOFRAN ODT) 4 MG  disintegrating tablet Take 1 tablet (4 mg total) by mouth every 8 (eight) hours as needed for nausea or vomiting. 20 tablet 0  . PENNSAID 2 % SOLN APPLY 1-2 PUMPS TOPICALLY TO THE AFFECTED AREA TWICE DAILY  2   No facility-administered medications prior to visit.     Allergies  Allergen Reactions  . Guaifenesin     REACTION: PVCs  . Prednisone     PVC    Review of Systems  Constitutional: Negative for fever and malaise/fatigue.  HENT: Negative for congestion.   Eyes: Negative for blurred vision.  Respiratory: Negative for cough and shortness of breath.   Cardiovascular: Negative for chest pain, palpitations and leg swelling.  Gastrointestinal: Negative for vomiting.  Musculoskeletal: Positive for neck pain. Negative for back pain.  Skin: Negative for rash.  Neurological: Negative for loss of consciousness  and headaches.       Objective:    Physical Exam  Constitutional: He is oriented to person, place, and time. He appears well-developed and well-nourished. No distress.  HENT:  Head: Normocephalic and atraumatic.  Eyes: Conjunctivae are normal.  Neck: Normal range of motion. No thyromegaly present.  Cardiovascular: Normal rate and regular rhythm.  Pulmonary/Chest: Effort normal and breath sounds normal. He has no wheezes.  Abdominal: Soft. Bowel sounds are normal. There is no tenderness.  Musculoskeletal: Normal range of motion. He exhibits no edema or deformity.  Neurological: He is alert and oriented to person, place, and time.  Skin: Skin is warm and dry. He is not diaphoretic.  Psychiatric: He has a normal mood and affect.    BP 100/72 (BP Location: Left Arm, Patient Position: Sitting, Cuff Size: Normal)   Pulse 80   Temp 98.1 F (36.7 C) (Oral)   Resp 18   Ht 5\' 7"  (1.702 m)   Wt 195 lb 9.6 oz (88.7 kg)   SpO2 97%   BMI 30.64 kg/m  Wt Readings from Last 3 Encounters:  04/16/17 195 lb 9.6 oz (88.7 kg)  02/19/17 198 lb 9.6 oz (90.1 kg)  09/12/16 193 lb 4 oz (87.7 kg)   BP Readings from Last 3 Encounters:  04/16/17 100/72  02/19/17 118/82  09/12/16 120/84     Immunization History  Administered Date(s) Administered  . Influenza Whole 11/18/2010  . Influenza,inj,Quad PF,6+ Mos 11/22/2012, 12/16/2013, 01/02/2015, 01/15/2016, 12/25/2016  . Td 07/12/2009  . Tdap 12/25/2016    Health Maintenance  Topic Date Due  . HIV Screening  04/09/1995  . TETANUS/TDAP  12/26/2026  . INFLUENZA VACCINE  Completed    Lab Results  Component Value Date   WBC 5.7 10/09/2015   HGB 14.7 10/09/2015   HCT 42.5 10/09/2015   PLT 258.0 10/09/2015   GLUCOSE 104 (H) 02/19/2017   CHOL 155 02/19/2017   TRIG 282.0 (H) 02/19/2017   HDL 36.00 (L) 02/19/2017   LDLDIRECT 100.0 02/19/2017   LDLCALC 110 (H) 10/09/2015   ALT 21 02/19/2017   AST 17 02/19/2017   NA 140 02/19/2017   K 4.3  02/19/2017   CL 103 02/19/2017   CREATININE 0.97 02/19/2017   BUN 13 02/19/2017   CO2 32 02/19/2017   TSH 0.54 02/19/2017   PSA 0.52 01/24/2014   HGBA1C 5.1 02/19/2017    Lab Results  Component Value Date   TSH 0.54 02/19/2017   Lab Results  Component Value Date   WBC 5.7 10/09/2015   HGB 14.7 10/09/2015   HCT 42.5 10/09/2015   MCV 87.5 10/09/2015   PLT  258.0 10/09/2015   Lab Results  Component Value Date   NA 140 02/19/2017   K 4.3 02/19/2017   CO2 32 02/19/2017   GLUCOSE 104 (H) 02/19/2017   BUN 13 02/19/2017   CREATININE 0.97 02/19/2017   BILITOT 0.5 02/19/2017   ALKPHOS 60 02/19/2017   AST 17 02/19/2017   ALT 21 02/19/2017   PROT 7.3 02/19/2017   ALBUMIN 4.4 02/19/2017   CALCIUM 9.4 02/19/2017   ANIONGAP 7 06/06/2014   GFR 92.63 02/19/2017   Lab Results  Component Value Date   CHOL 155 02/19/2017   Lab Results  Component Value Date   HDL 36.00 (L) 02/19/2017   Lab Results  Component Value Date   LDLCALC 110 (H) 10/09/2015   Lab Results  Component Value Date   TRIG 282.0 (H) 02/19/2017   Lab Results  Component Value Date   CHOLHDL 4 02/19/2017   Lab Results  Component Value Date   HGBA1C 5.1 02/19/2017         Assessment & Plan:   Problem List Items Addressed This Visit    Preventative health care    Patient encouraged to maintain heart healthy diet, regular exercise, adequate sleep. Consider daily probiotics. Take medications as prescribed      Hyperlipidemia, mixed    Encouraged heart healthy diet, increase exercise, avoid trans fats, consider a krill oil cap daily      Overweight    Encouraged DASH diet, decrease po intake and increase exercise as tolerated. Needs 7-8 hours of sleep nightly. Avoid trans fats, eat small, frequent meals every 4-5 hours with lean proteins, complex carbs and healthy fats. Minimize simple carbs      Hyperglycemia    hgba1c acceptable, minimize simple carbs. Increase exercise as tolerated.        Neck pain, acute    Doing somewhat better with chiropractic and massage therapy but has flared since having a new born.         I have discontinued Seibert A. Bosket's PENNSAID, DUEXIS, fluticasone, levocetirizine, and ondansetron. I am also having him maintain his tiZANidine.  No orders of the defined types were placed in this encounter.   CMA served as Neurosurgeonscribe during this visit. History, Physical and Plan performed by medical provider. Documentation and orders reviewed and attested to.  Danise EdgeStacey Glyn Gerads, MD

## 2017-04-16 NOTE — Assessment & Plan Note (Signed)
Patient encouraged to maintain heart healthy diet, regular exercise, adequate sleep. Consider daily probiotics. Take medications as prescribed 

## 2017-04-19 NOTE — Assessment & Plan Note (Signed)
Continues to struggle with intermittent pain but manages most days with minor work restrictions to mange flares.

## 2017-05-21 ENCOUNTER — Ambulatory Visit: Payer: No Typology Code available for payment source | Admitting: Family Medicine

## 2017-05-28 ENCOUNTER — Ambulatory Visit: Payer: No Typology Code available for payment source | Admitting: Family Medicine

## 2017-07-28 ENCOUNTER — Encounter: Payer: Self-pay | Admitting: Family Medicine

## 2017-07-28 ENCOUNTER — Ambulatory Visit: Payer: No Typology Code available for payment source | Admitting: Family Medicine

## 2017-07-28 DIAGNOSIS — F430 Acute stress reaction: Secondary | ICD-10-CM | POA: Diagnosis not present

## 2017-07-28 DIAGNOSIS — M6289 Other specified disorders of muscle: Secondary | ICD-10-CM

## 2017-07-28 DIAGNOSIS — R739 Hyperglycemia, unspecified: Secondary | ICD-10-CM | POA: Diagnosis not present

## 2017-07-28 DIAGNOSIS — F411 Generalized anxiety disorder: Secondary | ICD-10-CM | POA: Diagnosis not present

## 2017-07-28 MED ORDER — TIZANIDINE HCL 4 MG PO TABS: 4.0000 mg | ORAL_TABLET | Freq: Three times a day (TID) | ORAL | 3 refills | Status: DC | PRN

## 2017-07-28 MED FILL — tiZANidine HCL 4 MG TABS: 4 | 20 days supply | Qty: 60 | Fill #2

## 2017-07-28 NOTE — Patient Instructions (Signed)

## 2017-07-28 NOTE — Progress Notes (Signed)
Subjective:  I acted as a Neurosurgeon for Dr. Abner Greenspan. Jared Myers, Jared Myers  Patient ID: Jared Myers, male    DOB: 1980/07/02, 37 y.o.   MRN: [id number]  No chief complaint on file.   HPI  Patient is in today for 3 month follow up and he notes he did have a couple of days of diarrhea and fatigue but those have improved. He notes a slight increase in anxiety and agitation recently but no obvious trigger. No new stressors at work. His wife and new baby are doing well. His abdominal and back pain are manageable as long as he does not do too much heavy lifting or frequent bending etc. Denies CP/palp/SOB/HA/congestion/fevers or GU c/o. Taking meds as prescribed  Patient Care Team: Bradd Canary, MD as PCP - General (Family Medicine)   Past Medical History:  Diagnosis Date  . 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 Constipation 12/18/2013  . Hyperglycemia 09/28/2014  . Hyperlipidemia, mixed 06/22/2014  . Infectious mononucleosis 07/18/2013  . Inguinal hernia 05/12/2012  . Low back pain 12/16/2013  . Overweight 06/22/2014  . Pelvic floor dysfunction   . Premature ventricular beat   . Splenomegaly 07/18/2013  . Tremulousness 08/14/2013    No past surgical history on file.  Family History  Problem Relation Age of Onset  . Cancer Mother        Non Hodkins lymphoma, breast cancer  . Other Father        pituatary adenoma  . Cancer Paternal Uncle        recurrent prostate   . Heart disease Maternal Grandmother        chf  . Heart disease Maternal Grandfather        chf  . Heart disease Paternal Grandfather        chf  . Depression Brother   . Anemia Sister     Social History   Socioeconomic History  . Marital status: Divorced    Spouse name: Not on file  . Number of children: Not on file  . Years of education: Not on file  . Highest education level: Not on file  Occupational History  . Not on file  Social Needs  . Financial resource strain: Not on file  . Food insecurity:   Worry: Not on file    Inability: Not on file  . Transportation needs:    Medical: Not on file    Non-medical: Not on file  Tobacco Use  . Smoking status: Never Smoker  . Smokeless tobacco: Never Used  Substance and Sexual Activity  . Alcohol use: No  . Drug use: No  . 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 Physical activity:    Days per week: Not on file    Minutes per session: Not on file  . Stress: Not on file  Relationships  . Social connections:    Talks on phone: Not on file    Gets together: Not on file    Attends religious service: Not on file    Active member of club or organization: Not on file    Attends meetings of clubs or organizations: Not on file    Relationship status: Not on file  . Intimate partner violence:    Fear of current or ex partner: Not on file    Emotionally abused: Not on file    Physically abused: Not  on file    Forced sexual activity: Not on file  Other Topics Concern  . Not on file  Social History Narrative   New baby on the way 07/15/16   Wife is 13 weeks    Outpatient Medications Prior to Visit  Medication Sig Dispense Refill  . tiZANidine (ZANAFLEX) 4 MG tablet TAKE 1 CAPSULE (4 MG TOTAL) BY MOUTH 3 (THREE) TIMES DAILY AS NEEDED FOR MUSCLE SPASMS. 60 tablet 3   No facility-administered medications prior to visit.     Allergies  Allergen Reactions  . Guaifenesin     REACTION: PVCs  . Prednisone     PVC    Review of Systems  Constitutional: Negative for fever and malaise/fatigue.  HENT: Negative for congestion.   Eyes: Negative for blurred vision.  Respiratory: Negative for shortness of breath.   Cardiovascular: Negative for chest pain, palpitations and leg swelling.  Gastrointestinal: Positive for abdominal pain and diarrhea. Negative for blood in stool and nausea.  Genitourinary: Negative for dysuria and frequency.  Musculoskeletal:  Positive for back pain. Negative for falls.  Skin: Negative for rash.  Neurological: Negative for dizziness, loss of consciousness and headaches.  Endo/Heme/Allergies: Negative for environmental allergies.  Psychiatric/Behavioral: Positive for substance abuse. Negative for depression. The patient is nervous/anxious.        Objective:    Physical Exam  Constitutional: He is oriented to person, place, and time. He appears well-developed and well-nourished. No distress.  HENT:  Head: Normocephalic and atraumatic.  Nose: Nose normal.  Eyes: Right eye exhibits no discharge. Left eye exhibits no discharge.  Neck: Normal range of motion. Neck supple.  Cardiovascular: Normal rate and regular rhythm.  No murmur heard. Pulmonary/Chest: Effort normal and breath sounds normal.  Abdominal: Soft. Bowel sounds are normal. There is no tenderness.  Musculoskeletal: He exhibits no edema.  Neurological: He is alert and oriented to person, place, and time.  Skin: Skin is warm and dry.  Psychiatric: He has a normal mood and affect.  Nursing note and vitals reviewed.   BP 120/83 (BP Location: Left Arm, Patient Position: Sitting, Cuff Size: Normal)   Pulse 88   Temp 98 F (36.7 C) (Oral)   Resp 18   Wt 199 lb (90.3 kg)   SpO2 98%   BMI 31.17 kg/m  Wt Readings from Last 3 Encounters:  07/28/17 199 lb (90.3 kg)  04/16/17 195 lb 9.6 oz (88.7 kg)  02/19/17 198 lb 9.6 oz (90.1 kg)   BP Readings from Last 3 Encounters:  07/28/17 120/83  04/16/17 100/72  02/19/17 118/82     Immunization History  Administered Date(s) Administered  . Influenza Whole 11/18/2010  . Influenza,inj,Quad PF,6+ Mos 11/22/2012, 12/16/2013, 01/02/2015, 01/15/2016, 12/25/2016  . Td 07/12/2009  . Tdap 12/25/2016    Health Maintenance  Topic Date Due  . HIV Screening  04/09/1995  . INFLUENZA VACCINE  10/08/2017  . TETANUS/TDAP  12/26/2026    Lab Results  Component Value Date   WBC 5.7 10/09/2015   HGB 14.7  10/09/2015   HCT 42.5 10/09/2015   PLT 258.0 10/09/2015   GLUCOSE 104 (H) 02/19/2017   CHOL 155 02/19/2017   TRIG 282.0 (H) 02/19/2017   HDL 36.00 (L) 02/19/2017   LDLDIRECT 100.0 02/19/2017   LDLCALC 110 (H) 10/09/2015   ALT 21 02/19/2017   AST 17 02/19/2017   NA 140 02/19/2017   K 4.3 02/19/2017   CL 103 02/19/2017   CREATININE 0.97 02/19/2017   BUN 13 02/19/2017  CO2 32 02/19/2017   TSH 0.54 02/19/2017   PSA 0.52 01/24/2014   HGBA1C 5.1 02/19/2017    Lab Results  Component Value Date   TSH 0.54 02/19/2017   Lab Results  Component Value Date   WBC 5.7 10/09/2015   HGB 14.7 10/09/2015   HCT 42.5 10/09/2015   MCV 87.5 10/09/2015   PLT 258.0 10/09/2015   Lab Results  Component Value Date   NA 140 02/19/2017   K 4.3 02/19/2017   CO2 32 02/19/2017   GLUCOSE 104 (H) 02/19/2017   BUN 13 02/19/2017   CREATININE 0.97 02/19/2017   BILITOT 0.5 02/19/2017   ALKPHOS 60 02/19/2017   AST 17 02/19/2017   ALT 21 02/19/2017   PROT 7.3 02/19/2017   ALBUMIN 4.4 02/19/2017   CALCIUM 9.4 02/19/2017   ANIONGAP 7 06/06/2014   GFR 92.63 02/19/2017   Lab Results  Component Value Date   CHOL 155 02/19/2017   Lab Results  Component Value Date   HDL 36.00 (L) 02/19/2017   Lab Results  Component Value Date   LDLCALC 110 (H) 10/09/2015   Lab Results  Component Value Date   TRIG 282.0 (H) 02/19/2017   Lab Results  Component Value Date   CHOLHDL 4 02/19/2017   Lab Results  Component Value Date   HGBA1C 5.1 02/19/2017         Assessment & Plan:   Problem List Items Addressed This Visit    Anxiety in acute stress reaction    Notes a recent increase in anxiety recently. No obvious trigger work is going OK and his new baby is doing well. No changes for now. He will let us know if any worsening symptoms.       Pelvic floor dysfunction    As long as he avoids over exerting and heavy lifting his pain is tolerable at this time. May continue Tizanidine prn       Hyperglycemia    minimize simple carbs. Increase exercise as tolerated.          I have changed Jared Myers's tiZANidine.  Meds ordered this encounter  Medications  . tiZANidine (ZANAFLEX) 4 MG tablet    Sig: Take 1 tablet (4 mg total) by mouth every 8 (eight) hours as needed for muscle spasms.    Dispense:  60 tablet    Refill:  3    CMA served as scribe during this visit. History, Physical and Plan performed by medical provider. Documentation and orders reviewed and attested to.  Danise Edge, MD

## 2017-08-03 NOTE — Assessment & Plan Note (Signed)
As long as he avoids over exerting and heavy lifting his pain is tolerable at this time. May continue Tizanidine prn

## 2017-08-03 NOTE — Assessment & Plan Note (Signed)
minimize simple carbs. Increase exercise as tolerated.  

## 2017-08-03 NOTE — Assessment & Plan Note (Signed)
Notes a recent increase in anxiety recently. No obvious trigger work is going OK and his new baby is doing well. No changes for now. He will let us know if any worsening symptoms.

## 2017-10-27 ENCOUNTER — Ambulatory Visit: Payer: No Typology Code available for payment source | Admitting: Family Medicine

## 2017-10-27 ENCOUNTER — Encounter: Payer: Self-pay | Admitting: Family Medicine

## 2017-10-27 VITALS — BP 112/78 | HR 77 | Temp 98.2°F | Resp 18 | Ht 67.0 in | Wt 197.2 lb

## 2017-10-27 DIAGNOSIS — E782 Mixed hyperlipidemia: Secondary | ICD-10-CM

## 2017-10-27 DIAGNOSIS — R350 Frequency of micturition: Secondary | ICD-10-CM | POA: Diagnosis not present

## 2017-10-27 DIAGNOSIS — Z23 Encounter for immunization: Secondary | ICD-10-CM | POA: Diagnosis not present

## 2017-10-27 DIAGNOSIS — R739 Hyperglycemia, unspecified: Secondary | ICD-10-CM | POA: Diagnosis not present

## 2017-10-27 DIAGNOSIS — K5900 Constipation, unspecified: Secondary | ICD-10-CM

## 2017-10-27 DIAGNOSIS — Z0000 Encounter for general adult medical examination without abnormal findings: Secondary | ICD-10-CM

## 2017-10-27 DIAGNOSIS — M6289 Other specified disorders of muscle: Secondary | ICD-10-CM

## 2017-10-27 MED ORDER — TIZANIDINE HCL 4 MG PO TABS: 4.0000 mg | ORAL_TABLET | Freq: Three times a day (TID) | ORAL | 3 refills | Status: DC | PRN

## 2017-10-27 MED FILL — tiZANidine HCL 4 MG TABS: 4 | 20 days supply | Qty: 60 | Fill #0

## 2017-10-27 NOTE — Assessment & Plan Note (Signed)
Encouraged heart healthy diet, increase exercise, avoid trans fats, consider a krill oil cap daily 

## 2017-10-27 NOTE — Assessment & Plan Note (Addendum)
Pain is improved but still present continue meds prn and work restrictions. Continue restrictions and use meds sparingly

## 2017-10-27 NOTE — Patient Instructions (Signed)
MIND or DASH Eating Plan DASH stands for "Dietary Approaches to Stop Hypertension." The DASH eating plan is a healthy eating plan that has been shown to reduce high blood pressure (hypertension). It may also reduce your risk for type 2 diabetes, heart disease, and stroke. The DASH eating plan may also help with weight loss. What are tips for following this plan? General guidelines  Avoid eating more than 2,300 mg (milligrams) of salt (sodium) a day. If you have hypertension, you may need to reduce your sodium intake to 1,500 mg a day.  Limit alcohol intake to no more than 1 drink a day for nonpregnant women and 2 drinks a day for men. One drink equals 12 oz of beer, 5 oz of wine, or 1 oz of hard liquor.  Work with your health care provider to maintain a healthy body weight or to lose weight. Ask what an ideal weight is for you.  Get at least 30 minutes of exercise that causes your heart to beat faster (aerobic exercise) most days of the week. Activities may include walking, swimming, or biking.  Work with your health care provider or diet and nutrition specialist (dietitian) to adjust your eating plan to your individual calorie needs. Reading food labels  Check food labels for the amount of sodium per serving. Choose foods with less than 5 percent of the Daily Value of sodium. Generally, foods with less than 300 mg of sodium per serving fit into this eating plan.  To find whole grains, look for the word "whole" as the first word in the ingredient list. Shopping  Buy products labeled as "low-sodium" or "no salt added."  Buy fresh foods. Avoid canned foods and premade or frozen meals. Cooking  Avoid adding salt when cooking. Use salt-free seasonings or herbs instead of table salt or sea salt. Check with your health care provider or pharmacist before using salt substitutes.  Do not fry foods. Cook foods using healthy methods such as baking, boiling, grilling, and broiling instead.  Cook  with heart-healthy oils, such as olive, canola, soybean, or sunflower oil. Meal planning   Eat a balanced diet that includes: ? 5 or more servings of fruits and vegetables each day. At each meal, try to fill half of your plate with fruits and vegetables. ? Up to 6-8 servings of whole grains each day. ? Less than 6 oz of lean meat, poultry, or fish each day. A 3-oz serving of meat is about the same size as a deck of cards. One egg equals 1 oz. ? 2 servings of low-fat dairy each day. ? A serving of nuts, seeds, or beans 5 times each week. ? Heart-healthy fats. Healthy fats called Omega-3 fatty acids are found in foods such as flaxseeds and coldwater fish, like sardines, salmon, and mackerel.  Limit how much you eat of the following: ? Canned or prepackaged foods. ? Food that is high in trans fat, such as fried foods. ? Food that is high in saturated fat, such as fatty meat. ? Sweets, desserts, sugary drinks, and other foods with added sugar. ? Full-fat dairy products.  Do not salt foods before eating.  Try to eat at least 2 vegetarian meals each week.  Eat more home-cooked food and less restaurant, buffet, and fast food.  When eating at a restaurant, ask that your food be prepared with less salt or no salt, if possible. What foods are recommended? The items listed may not be a complete list. Talk with your dietitian   about what dietary choices are best for you. Grains Whole-grain or whole-wheat bread. Whole-grain or whole-wheat pasta. Brown rice. Oatmeal. Quinoa. Bulgur. Whole-grain and low-sodium cereals. Pita bread. Low-fat, low-sodium crackers. Whole-wheat flour tortillas. Vegetables Fresh or frozen vegetables (raw, steamed, roasted, or grilled). Low-sodium or reduced-sodium tomato and vegetable juice. Low-sodium or reduced-sodium tomato sauce and tomato paste. Low-sodium or reduced-sodium canned vegetables. Fruits All fresh, dried, or frozen fruit. Canned fruit in natural juice  (without added sugar). Meat and other protein foods Skinless chicken or turkey. Ground chicken or turkey. Pork with fat trimmed off. Fish and seafood. Egg whites. Dried beans, peas, or lentils. Unsalted nuts, nut butters, and seeds. Unsalted canned beans. Lean cuts of beef with fat trimmed off. Low-sodium, lean deli meat. Dairy Low-fat (1%) or fat-free (skim) milk. Fat-free, low-fat, or reduced-fat cheeses. Nonfat, low-sodium ricotta or cottage cheese. Low-fat or nonfat yogurt. Low-fat, low-sodium cheese. Fats and oils Soft margarine without trans fats. Vegetable oil. Low-fat, reduced-fat, or light mayonnaise and salad dressings (reduced-sodium). Canola, safflower, olive, soybean, and sunflower oils. Avocado. Seasoning and other foods Herbs. Spices. Seasoning mixes without salt. Unsalted popcorn and pretzels. Fat-free sweets. What foods are not recommended? The items listed may not be a complete list. Talk with your dietitian about what dietary choices are best for you. Grains Baked goods made with fat, such as croissants, muffins, or some breads. Dry pasta or rice meal packs. Vegetables Creamed or fried vegetables. Vegetables in a cheese sauce. Regular canned vegetables (not low-sodium or reduced-sodium). Regular canned tomato sauce and paste (not low-sodium or reduced-sodium). Regular tomato and vegetable juice (not low-sodium or reduced-sodium). Pickles. Olives. Fruits Canned fruit in a light or heavy syrup. Fried fruit. Fruit in cream or butter sauce. Meat and other protein foods Fatty cuts of meat. Ribs. Fried meat. Bacon. Sausage. Bologna and other processed lunch meats. Salami. Fatback. Hotdogs. Bratwurst. Salted nuts and seeds. Canned beans with added salt. Canned or smoked fish. Whole eggs or egg yolks. Chicken or turkey with skin. Dairy Whole or 2% milk, cream, and half-and-half. Whole or full-fat cream cheese. Whole-fat or sweetened yogurt. Full-fat cheese. Nondairy creamers. Whipped  toppings. Processed cheese and cheese spreads. Fats and oils Butter. Stick margarine. Lard. Shortening. Ghee. Bacon fat. Tropical oils, such as coconut, palm kernel, or palm oil. Seasoning and other foods Salted popcorn and pretzels. Onion salt, garlic salt, seasoned salt, table salt, and sea salt. Worcestershire sauce. Tartar sauce. Barbecue sauce. Teriyaki sauce. Soy sauce, including reduced-sodium. Steak sauce. Canned and packaged gravies. Fish sauce. Oyster sauce. Cocktail sauce. Horseradish that you find on the shelf. Ketchup. Mustard. Meat flavorings and tenderizers. Bouillon cubes. Hot sauce and Tabasco sauce. Premade or packaged marinades. Premade or packaged taco seasonings. Relishes. Regular salad dressings. Where to find more information:  National Heart, Lung, and Blood Institute: www.nhlbi.nih.gov  American Heart Association: www.heart.org Summary  The DASH eating plan is a healthy eating plan that has been shown to reduce high blood pressure (hypertension). It may also reduce your risk for type 2 diabetes, heart disease, and stroke.  With the DASH eating plan, you should limit salt (sodium) intake to 2,300 mg a day. If you have hypertension, you may need to reduce your sodium intake to 1,500 mg a day.  When on the DASH eating plan, aim to eat more fresh fruits and vegetables, whole grains, lean proteins, low-fat dairy, and heart-healthy fats.  Work with your health care provider or diet and nutrition specialist (dietitian) to adjust your eating plan to   your individual calorie needs. This information is not intended to replace advice given to you by your health care provider. Make sure you discuss any questions you have with your health care provider. Document Released: 02/13/2011 Document Revised: 02/18/2016 Document Reviewed: 02/18/2016 Elsevier Interactive Patient Education  2018 [employer]  

## 2017-10-27 NOTE — Assessment & Plan Note (Deleted)
Patient encouraged to maintain heart healthy diet, regular exercise, adequate sleep. Consider daily probiotics. Take medications as prescribed 

## 2017-10-27 NOTE — Assessment & Plan Note (Signed)
Encouraged increased hydration and fiber in diet. Daily probiotics. If bowels not moving can use MOM 2 tbls po in 4 oz of warm prune juice by mouth every 2-3 days. If no results then repeat in 4 hours with  Dulcolax suppository pr, may repeat again in 4 more hours as needed. Seek care if symptoms worsen. Consider daily Miralax and/or Dulcolax if symptoms persist.  

## 2017-10-27 NOTE — Assessment & Plan Note (Signed)
hgba1c acceptable, minimize simple carbs. Increase exercise as tolerated.  

## 2017-10-28 LAB — URINALYSIS
BILIRUBIN URINE: NEGATIVE
HGB URINE DIPSTICK: NEGATIVE
Ketones, ur: NEGATIVE
LEUKOCYTES UA: NEGATIVE
NITRITE: NEGATIVE
Specific Gravity, Urine: <=1.005 — AB (ref 1.000–1.030)
TOTAL PROTEIN, URINE-UPE24: NEGATIVE
Urine Glucose: NEGATIVE
Urobilinogen, UA: 0.2 (ref 0.0–1.0)
pH: 6 (ref 5.0–8.0)

## 2017-10-28 LAB — COMPREHENSIVE METABOLIC PANEL
ALBUMIN: 4.7 g/dL (ref 3.5–5.2)
ALT: 22 U/L (ref 0–53)
AST: 17 U/L (ref 0–37)
Alkaline Phosphatase: 69 U/L (ref 39–117)
BILIRUBIN TOTAL: 0.6 mg/dL (ref 0.2–1.2)
BUN: 9 mg/dL (ref 6–23)
CALCIUM: 9.8 mg/dL (ref 8.4–10.5)
CO2: 29 mEq/L (ref 19–32)
Chloride: 101 mEq/L (ref 96–112)
Creatinine, Ser: 1.03 mg/dL (ref 0.40–1.50)
GFR: 86.11 mL/min (ref >60.00)
Glucose, Bld: 88 mg/dL (ref 70–99)
Potassium: 4 mEq/L (ref 3.5–5.1)
Sodium: 138 mEq/L (ref 135–145)
Total Protein: 7.5 g/dL (ref 6.0–8.3)

## 2017-10-28 LAB — CBC
HCT: 44.5 % (ref 39.0–52.0)
Hemoglobin: 15.4 g/dL (ref 13.0–17.0)
MCHC: 34.7 g/dL (ref 30.0–36.0)
MCV: 87 fl (ref 78.0–100.0)
Platelets: 295 10*3/uL (ref 150.0–400.0)
RBC: 5.11 Mil/uL (ref 4.22–5.81)
RDW: 13.4 % (ref 11.5–15.5)
WBC: 6.5 10*3/uL (ref 4.0–10.5)

## 2017-10-28 LAB — LIPID PANEL
CHOLESTEROL: 178 mg/dL (ref 0–200)
HDL: 37.3 mg/dL — ABNORMAL LOW (ref >39.00)
LDL Cholesterol: 107 mg/dL — ABNORMAL HIGH (ref 0–99)
NonHDL: 140.54
TRIGLYCERIDES: 170 mg/dL — AB (ref 0.0–149.0)
Total CHOL/HDL Ratio: 5
VLDL: 34 mg/dL (ref 0.0–40.0)

## 2017-10-28 LAB — TSH: TSH: 0.78 u[IU]/mL (ref 0.35–4.50)

## 2017-10-28 LAB — HEMOGLOBIN A1C: Hgb A1c MFr Bld: 5.2 % (ref 4.6–6.5)

## 2017-11-01 NOTE — Progress Notes (Signed)
Subjective:    Patient ID: Jared Myers, male    DOB: [birth year], 37 y.o.   MRN: [id number]  No chief complaint on file.   HPI Patient is in today for follow up. He is doing well for the most part. His infant daughter is doing well. Work is going well with his curent work restrictions. He has occasional abdominal and joint pain but it is manageable without meds most of the time. Denies CP/palp/SOB/HA/congestion/fevers/GI or GU c/o. Taking meds as prescribed  Past Medical History:  Diagnosis Date  . 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 Constipation 12/18/2013  . Hyperglycemia 09/28/2014  . Hyperlipidemia, mixed 06/22/2014  . Infectious mononucleosis 07/18/2013  . Inguinal hernia 05/12/2012  . Low back pain 12/16/2013  . Overweight 06/22/2014  . Pelvic floor dysfunction   . Premature ventricular beat   . Splenomegaly 07/18/2013  . Tremulousness 08/14/2013    History reviewed. No pertinent surgical history.  Family History  Problem Relation Age of Onset  . Cancer Mother        Non Hodkins lymphoma, breast cancer  . Other Father        pituatary adenoma  . Cancer Paternal Uncle        recurrent prostate   . Heart disease Maternal Grandmother        chf  . Heart disease Maternal Grandfather        chf  . Heart disease Paternal Grandfather        chf  . Depression Brother   . Anemia Sister     Social History   Socioeconomic History  . Marital status: Divorced    Spouse name: Not on file  . Number of children: Not on file  . Years of education: Not on file  . Highest education level: Not on file  Occupational History  . Not on file  Social Needs  . Financial resource strain: Not on file  . Food insecurity:    Worry: Not on file    Inability: Not on file  . Transportation needs:    Medical: Not on file    Non-medical: Not on file  Tobacco Use  . Smoking status: Never Smoker  . Smokeless tobacco: Never Used  Substance and Sexual Activity  . Alcohol use:  No  . Drug use: No  . 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 Physical activity:    Days per week: Not on file    Minutes per session: Not on file  . Stress: Not on file  Relationships  . Social connections:    Talks on phone: Not on file    Gets together: Not on file    Attends religious service: Not on file    Active member of club or organization: Not on file    Attends meetings of clubs or organizations: Not on file    Relationship status: Not on file  . Intimate partner violence:    Fear of current or ex partner: Not on file    Emotionally abused: Not on file    Physically abused: Not on file    Forced sexual activity: Not on file  Other Topics Concern  . Not on file  Social History Narrative   New baby on the way 07/15/16   Wife is 13 weeks    Outpatient Medications Prior to Visit  Medication Sig Dispense Refill  .  tiZANidine (ZANAFLEX) 4 MG tablet Take 1 tablet (4 mg total) by mouth every 8 (eight) hours as needed for muscle spasms. 60 tablet 3   No facility-administered medications prior to visit.     Allergies  Allergen Reactions  . Guaifenesin     REACTION: PVCs  . Prednisone     PVC    Review of Systems  Constitutional: Negative for fever and malaise/fatigue.  HENT: Negative for congestion.   Eyes: Negative for blurred vision.  Respiratory: Negative for shortness of breath.   Cardiovascular: Negative for chest pain, palpitations and leg swelling.  Gastrointestinal: Positive for abdominal pain. Negative for blood in stool and nausea.  Genitourinary: Negative for dysuria and frequency.  Musculoskeletal: Positive for joint pain. Negative for falls.  Skin: Negative for rash.  Neurological: Negative for dizziness, loss of consciousness and headaches.  Endo/Heme/Allergies: Negative for environmental allergies.  Psychiatric/Behavioral: Negative for depression. The patient  is not nervous/anxious.        Objective:    Physical Exam  Constitutional: He is oriented to person, place, and time. He appears well-developed and well-nourished. No distress.  HENT:  Head: Normocephalic and atraumatic.  Nose: Nose normal.  Eyes: Right eye exhibits no discharge. Left eye exhibits no discharge.  Neck: Normal range of motion. Neck supple.  Cardiovascular: Normal rate and regular rhythm.  No murmur heard. Pulmonary/Chest: Effort normal and breath sounds normal.  Abdominal: Soft. Bowel sounds are normal. There is no tenderness.  Musculoskeletal: He exhibits no edema.  Neurological: He is alert and oriented to person, place, and time.  Skin: Skin is warm and dry.  Psychiatric: He has a normal mood and affect.  Nursing note and vitals reviewed.   BP 112/78 (BP Location: Left Arm, Patient Position: Sitting, Cuff Size: Normal)   Pulse 77   Temp 98.2 F (36.8 C) (Oral)   Resp 18   Ht 5\' 7"  (1.702 m)   Wt 197 lb 3.2 oz (89.4 kg)   SpO2 99%   BMI 30.89 kg/m  Wt Readings from Last 3 Encounters:  10/27/17 197 lb 3.2 oz (89.4 kg)  07/28/17 199 lb (90.3 kg)  04/16/17 195 lb 9.6 oz (88.7 kg)     Lab Results  Component Value Date   WBC 6.5 10/27/2017   HGB 15.4 10/27/2017   HCT 44.5 10/27/2017   PLT 295.0 10/27/2017   GLUCOSE 88 10/27/2017   CHOL 178 10/27/2017   TRIG 170.0 (H) 10/27/2017   HDL 37.30 (L) 10/27/2017   LDLDIRECT 100.0 02/19/2017   LDLCALC 107 (H) 10/27/2017   ALT 22 10/27/2017   AST 17 10/27/2017   NA 138 10/27/2017   K 4.0 10/27/2017   CL 101 10/27/2017   CREATININE 1.03 10/27/2017   BUN 9 10/27/2017   CO2 29 10/27/2017   TSH 0.78 10/27/2017   PSA 0.52 01/24/2014   HGBA1C 5.2 10/27/2017    Lab Results  Component Value Date   TSH 0.78 10/27/2017   Lab Results  Component Value Date   WBC 6.5 10/27/2017   HGB 15.4 10/27/2017   HCT 44.5 10/27/2017   MCV 87.0 10/27/2017   PLT 295.0 10/27/2017   Lab Results  Component Value  Date   NA 138 10/27/2017   K 4.0 10/27/2017   CO2 29 10/27/2017   GLUCOSE 88 10/27/2017   BUN 9 10/27/2017   CREATININE 1.03 10/27/2017   BILITOT 0.6 10/27/2017   ALKPHOS 69 10/27/2017   AST 17 10/27/2017   ALT 22 10/27/2017  PROT 7.5 10/27/2017   ALBUMIN 4.7 10/27/2017   CALCIUM 9.8 10/27/2017   ANIONGAP 7 06/06/2014   GFR 86.11 10/27/2017   Lab Results  Component Value Date   CHOL 178 10/27/2017   Lab Results  Component Value Date   HDL 37.30 (L) 10/27/2017   Lab Results  Component Value Date   LDLCALC 107 (H) 10/27/2017   Lab Results  Component Value Date   TRIG 170.0 (H) 10/27/2017   Lab Results  Component Value Date   CHOLHDL 5 10/27/2017   Lab Results  Component Value Date   HGBA1C 5.2 10/27/2017       Assessment & Plan:   Problem List Items Addressed This Visit    Pelvic floor dysfunction    Pain is improved but still present continue meds prn and work restrictions. Continue restrictions and use meds sparingly      Relevant Orders   CBC (Completed)   Comprehensive metabolic panel (Completed)   TSH (Completed)   Preventative health care   Constipation    Encouraged increased hydration and fiber in diet. Daily probiotics. If bowels not moving can use MOM 2 tbls po in 4 oz of warm prune juice by mouth every 2-3 days. If no results then repeat in 4 hours with  Dulcolax suppository pr, may repeat again in 4 more hours as needed. Seek care if symptoms worsen. Consider daily Miralax and/or Dulcolax if symptoms persist.       Relevant Orders   CBC (Completed)   Comprehensive metabolic panel (Completed)   TSH (Completed)   Hyperlipidemia, mixed    Encouraged heart healthy diet, increase exercise, avoid trans fats, consider a krill oil cap daily      Relevant Orders   Lipid panel (Completed)   TSH (Completed)   Hyperglycemia    hgba1c acceptable, minimize simple carbs. Increase exercise as tolerated.        Relevant Orders   Comprehensive  metabolic panel (Completed)   TSH (Completed)   Hemoglobin A1c (Completed)    Other Visit Diagnoses    Urinary frequency    -  Primary   Relevant Orders   Urinalysis (Completed)   Needs flu shot       Relevant Orders   Flu Vaccine QUAD 6+ mos PF IM (Fluarix Quad PF) (Completed)      I am having Jared Flake maintain his tiZANidine.  Meds ordered this encounter  Medications  . tiZANidine (ZANAFLEX) 4 MG tablet    Sig: Take 1 tablet (4 mg total) by mouth every 8 (eight) hours as needed for muscle spasms.    Dispense:  60 tablet    Refill:  3     Danise Edge, MD

## 2017-12-01 ENCOUNTER — Ambulatory Visit: Payer: No Typology Code available for payment source | Admitting: Family Medicine

## 2017-12-01 DIAGNOSIS — M6289 Other specified disorders of muscle: Secondary | ICD-10-CM | POA: Diagnosis not present

## 2017-12-01 DIAGNOSIS — R739 Hyperglycemia, unspecified: Secondary | ICD-10-CM

## 2017-12-01 DIAGNOSIS — H00016 Hordeolum externum left eye, unspecified eyelid: Secondary | ICD-10-CM

## 2017-12-01 DIAGNOSIS — G8929 Other chronic pain: Secondary | ICD-10-CM

## 2017-12-01 DIAGNOSIS — K5900 Constipation, unspecified: Secondary | ICD-10-CM

## 2017-12-01 DIAGNOSIS — M545 Low back pain: Secondary | ICD-10-CM

## 2017-12-01 MED ORDER — DOXYCYCLINE HYCLATE 100 MG PO TABS: 100.0000 mg | ORAL_TABLET | Freq: Two times a day (BID) | ORAL | 0 refills | Status: DC

## 2017-12-01 MED ORDER — POLYMYXIN B-TRIMETHOPRIM 10000-0.1 UNIT/ML-% OP SOLN: 2.0000 [drp] | Freq: Three times a day (TID) | OPHTHALMIC | 0 refills | Status: DC | PRN

## 2017-12-01 MED FILL — POLYMYXIN B/TMP EYE DROPS: 10000-0.1 | 33 days supply | Qty: 10 | Fill #0

## 2017-12-01 MED FILL — DOXYCYCLINE HYCLATE 100 MG: 100 | 10 days supply | Qty: 20 | Fill #0

## 2017-12-01 NOTE — Patient Instructions (Signed)

## 2017-12-06 DIAGNOSIS — H00019 Hordeolum externum unspecified eye, unspecified eyelid: Secondary | ICD-10-CM | POA: Insufficient documentation

## 2017-12-06 NOTE — Assessment & Plan Note (Signed)
Has done much better since he has been able to maintain his work restrictions. Will continue to maintain.

## 2017-12-06 NOTE — Assessment & Plan Note (Signed)
hgba1c acceptable, minimize simple carbs. Increase exercise as tolerated.  

## 2017-12-06 NOTE — Progress Notes (Signed)
Subjective:    Patient ID: Jared Myers, male    DOB: 1981/01/17, 37 y.o.   MRN: [id number]  No chief complaint on file.   HPI Patient is in today for evaluation of a stye on his left eye. It is red and swollen. It is improved since yesterday some. No fevers or chills. No photophobia or vision changes. He has tried warm compresses but with only temporary relief. He continues to work full time and his pelvic floor dysfunction and low back pain are manageable with his ongoing work restrictions. No falls or injury. Denies CP/palp/SOB/HA/congestion/fevers/GI or GU c/o. Taking meds as prescribed Past Medical History:  Diagnosis Date  . 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 Constipation 12/18/2013  . Hyperglycemia 09/28/2014  . Hyperlipidemia, mixed 06/22/2014  . Infectious mononucleosis 07/18/2013  . Inguinal hernia 05/12/2012  . Low back pain 12/16/2013  . Overweight 06/22/2014  . Pelvic floor dysfunction   . Premature ventricular beat   . Splenomegaly 07/18/2013  . Tremulousness 08/14/2013    No past surgical history on file.  Family History  Problem Relation Age of Onset  . Cancer Mother        Non Hodkins lymphoma, breast cancer  . Other Father        pituatary adenoma  . Cancer Paternal Uncle        recurrent prostate   . Heart disease Maternal Grandmother        chf  . Heart disease Maternal Grandfather        chf  . Heart disease Paternal Grandfather        chf  . Depression Brother   . Anemia Sister     Social History   Socioeconomic History  . Marital status: Divorced    Spouse name: Not on file  . Number of children: Not on file  . Years of education: Not on file  . Highest education level: Not on file  Occupational History  . Not on file  Social Needs  . Financial resource strain: Not on file  . Food insecurity:    Worry: Not on file    Inability: Not on file  . Transportation needs:    Medical: Not on file    Non-medical: Not on file  Tobacco  Use  . Smoking status: Never Smoker  . Smokeless tobacco: Never Used  Substance and Sexual Activity  . Alcohol use: No  . Drug use: No  . 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 Physical activity:    Days per week: Not on file    Minutes per session: Not on file  . Stress: Not on file  Relationships  . Social connections:    Talks on phone: Not on file    Gets together: Not on file    Attends religious service: Not on file    Active member of club or organization: Not on file    Attends meetings of clubs or organizations: Not on file    Relationship status: Not on file  . Intimate partner violence:    Fear of current or ex partner: Not on file    Emotionally abused: Not on file    Physically abused: Not on file    Forced sexual activity: Not on file  Other Topics Concern  . Not on file  Social History Narrative   New baby on the way  07/15/16   Wife is 13 weeks    Outpatient Medications Prior to Visit  Medication Sig Dispense Refill  . tiZANidine (ZANAFLEX) 4 MG tablet Take 1 tablet (4 mg total) by mouth every 8 (eight) hours as needed for muscle spasms. 60 tablet 3   No facility-administered medications prior to visit.     Allergies  Allergen Reactions  . Guaifenesin     REACTION: PVCs  . Prednisone     PVC    Review of Systems  Constitutional: Negative for fever and malaise/fatigue.  HENT: Negative for congestion.   Eyes: Negative for blurred vision.  Respiratory: Negative for shortness of breath.   Cardiovascular: Negative for chest pain, palpitations and leg swelling.  Gastrointestinal: Positive for abdominal pain. Negative for blood in stool and nausea.  Genitourinary: Negative for dysuria and frequency.  Musculoskeletal: Positive for back pain. Negative for falls.  Skin: Negative for rash.  Neurological: Negative for dizziness, loss of consciousness and headaches.    Endo/Heme/Allergies: Negative for environmental allergies.  Psychiatric/Behavioral: Negative for depression. The patient is not nervous/anxious.        Objective:    Physical Exam  Constitutional: He is oriented to person, place, and time. He appears well-developed and well-nourished. No distress.  HENT:  Head: Normocephalic and atraumatic.  Nose: Nose normal.  Eyes: Right eye exhibits no discharge. Left eye exhibits no discharge.  Left eyelid erythematous and swollen. Sclera clear. No photophobia or tenderness.   Neck: Normal range of motion. Neck supple.  Cardiovascular: Normal rate and regular rhythm.  No murmur heard. Pulmonary/Chest: Effort normal and breath sounds normal.  Abdominal: Soft. Bowel sounds are normal. There is no tenderness.  Musculoskeletal: He exhibits no edema.  Neurological: He is alert and oriented to person, place, and time.  Skin: Skin is warm and dry.  Psychiatric: He has a normal mood and affect.  Nursing note and vitals reviewed.   BP 120/90 (BP Location: Left Arm, Patient Position: Sitting, Cuff Size: Normal)   Pulse 67   Temp 98.1 F (36.7 C) (Oral)   Resp 18   Wt 195 lb 9.6 oz (88.7 kg)   SpO2 98%   BMI 30.64 kg/m  Wt Readings from Last 3 Encounters:  12/01/17 195 lb 9.6 oz (88.7 kg)  10/27/17 197 lb 3.2 oz (89.4 kg)  07/28/17 199 lb (90.3 kg)     Lab Results  Component Value Date   WBC 6.5 10/27/2017   HGB 15.4 10/27/2017   HCT 44.5 10/27/2017   PLT 295.0 10/27/2017   GLUCOSE 88 10/27/2017   CHOL 178 10/27/2017   TRIG 170.0 (H) 10/27/2017   HDL 37.30 (L) 10/27/2017   LDLDIRECT 100.0 02/19/2017   LDLCALC 107 (H) 10/27/2017   ALT 22 10/27/2017   AST 17 10/27/2017   NA 138 10/27/2017   K 4.0 10/27/2017   CL 101 10/27/2017   CREATININE 1.03 10/27/2017   BUN 9 10/27/2017   CO2 29 10/27/2017   TSH 0.78 10/27/2017   PSA 0.52 01/24/2014   HGBA1C 5.2 10/27/2017    Lab Results  Component Value Date   TSH 0.78 10/27/2017    Lab Results  Component Value Date   WBC 6.5 10/27/2017   HGB 15.4 10/27/2017   HCT 44.5 10/27/2017   MCV 87.0 10/27/2017   PLT 295.0 10/27/2017   Lab Results  Component Value Date   NA 138 10/27/2017   K 4.0 10/27/2017   CO2 29 10/27/2017   GLUCOSE 88 10/27/2017  BUN 9 10/27/2017   CREATININE 1.03 10/27/2017   BILITOT 0.6 10/27/2017   ALKPHOS 69 10/27/2017   AST 17 10/27/2017   ALT 22 10/27/2017   PROT 7.5 10/27/2017   ALBUMIN 4.7 10/27/2017   CALCIUM 9.8 10/27/2017   ANIONGAP 7 06/06/2014   GFR 86.11 10/27/2017   Lab Results  Component Value Date   CHOL 178 10/27/2017   Lab Results  Component Value Date   HDL 37.30 (L) 10/27/2017   Lab Results  Component Value Date   LDLCALC 107 (H) 10/27/2017   Lab Results  Component Value Date   TRIG 170.0 (H) 10/27/2017   Lab Results  Component Value Date   CHOLHDL 5 10/27/2017   Lab Results  Component Value Date   HGBA1C 5.2 10/27/2017       Assessment & Plan:   Problem List Items Addressed This Visit    Pelvic floor dysfunction    Has done much better since he has been able to maintain his work restrictions. Will continue to maintain.       Low back pain    Encouraged moist heat and gentle stretching as tolerated. May try NSAIDs and prescription meds as directed and report if symptoms worsen or seek immediate care. Tizanidine prn      Constipation    Encouraged increased hydration and fiber in diet. Daily probiotics. If bowels not moving can use MOM 2 tbls po in 4 oz of warm prune juice by mouth every 2-3 days. If no results then repeat in 4 hours with  Dulcolax suppository pr, may repeat again in 4 more hours as needed. Seek care if symptoms worsen. Consider daily Miralax and/or Dulcolax if symptoms persist.       Hyperglycemia    hgba1c acceptable, minimize simple carbs. Increase exercise as tolerated.       Stye    Hot compresses. Polytrim drops and if no response given Doxycycline to use bid.           I am having Jared Myers start on trimethoprim-polymyxin b and doxycycline. I am also having him maintain his tiZANidine.  Meds ordered this encounter  Medications  . trimethoprim-polymyxin b (POLYTRIM) ophthalmic solution    Sig: Place 2 drops into the left eye 3 (three) times daily as needed.    Dispense:  10 mL    Refill:  0  . doxycycline (VIBRA-TABS) 100 MG tablet    Sig: Take 1 tablet (100 mg total) by mouth 2 (two) times daily.    Dispense:  20 tablet    Refill:  0     Danise Edge, MD

## 2017-12-06 NOTE — Assessment & Plan Note (Signed)
Encouraged moist heat and gentle stretching as tolerated. May try NSAIDs and prescription meds as directed and report if symptoms worsen or seek immediate care. Tizanidine prn 

## 2017-12-06 NOTE — Assessment & Plan Note (Signed)
Hot compresses. Polytrim drops and if no response given Doxycycline to use bid.

## 2017-12-06 NOTE — Assessment & Plan Note (Signed)
Encouraged increased hydration and fiber in diet. Daily probiotics. If bowels not moving can use MOM 2 tbls po in 4 oz of warm prune juice by mouth every 2-3 days. If no results then repeat in 4 hours with  Dulcolax suppository pr, may repeat again in 4 more hours as needed. Seek care if symptoms worsen. Consider daily Miralax and/or Dulcolax if symptoms persist.  

## 2017-12-14 ENCOUNTER — Telehealth: Payer: Self-pay | Admitting: Family Medicine

## 2017-12-14 NOTE — Telephone Encounter (Signed)
Please advise 

## 2017-12-14 NOTE — Telephone Encounter (Signed)
Go ahead and try the pills since it is persistent

## 2017-12-14 NOTE — Telephone Encounter (Signed)
Copied from CRM [id number]. Topic: Quick Communication - Rx Refill/Question >> Dec 14, 2017  8:36 AM Baldo Daub L wrote: Medication:  trimethoprim-polymyxin b (POLYTRIM) ophthalmic solution  Pt states that he took this medication and it was getting better.  Pt states stye looks like it has come to a head and he wants to know if he should continue the eye drops of start with the pills?  Pt can be reached at [phone] (Pt states that voice mail will say you are leaving a message for Collie Siad - this person is retired and that is the correct voicemail box to leave a message).

## 2017-12-14 NOTE — Telephone Encounter (Signed)
Called left detailed  message for patient

## 2017-12-16 ENCOUNTER — Encounter: Payer: Self-pay | Admitting: Medical

## 2017-12-16 ENCOUNTER — Ambulatory Visit: Payer: No Typology Code available for payment source | Admitting: Medical

## 2017-12-16 VITALS — BP 128/86 | HR 85 | Temp 98.2°F | Resp 16 | Ht 67.0 in | Wt 196.8 lb

## 2017-12-16 DIAGNOSIS — R591 Generalized enlarged lymph nodes: Secondary | ICD-10-CM | POA: Diagnosis not present

## 2017-12-16 DIAGNOSIS — K089 Disorder of teeth and supporting structures, unspecified: Secondary | ICD-10-CM

## 2017-12-16 DIAGNOSIS — H9202 Otalgia, left ear: Secondary | ICD-10-CM | POA: Diagnosis not present

## 2017-12-16 DIAGNOSIS — J029 Acute pharyngitis, unspecified: Secondary | ICD-10-CM | POA: Diagnosis not present

## 2017-12-16 LAB — POCT RAPID STREP A (OFFICE): RAPID STREP A SCREEN: NEGATIVE

## 2017-12-16 MED ORDER — CEFTRIAXONE SODIUM 1 G IJ SOLR
1.0000 g | Freq: Once | INTRAMUSCULAR | Status: AC
  Administered 2017-12-16: 1 g via INTRAMUSCULAR

## 2017-12-16 NOTE — Progress Notes (Signed)
Subjective:    Patient ID: Jared Myers, male    DOB: 07-16-1980, 37 y.o.   MRN: [id number]  HPI  Pt in for in with some swelling left submandibular node.   Pt states he had some recent left lower lid stye that was present for about 10 days. He states stye has lingered and he just started  doxycycline this Monday. Pt left side lymph node became palpable on Sunday No obvious teeth pain.  Has mild st today. When swallow lymph node area hurts.Faint discomfort in left ear.    Review of Systems  Constitutional: Negative for chills, fatigue and fever.  HENT: Positive for ear pain and sore throat. Negative for congestion and tinnitus.   Eyes:       Left lower lid stye.  Respiratory: Negative for cough, chest tightness, shortness of breath and wheezing.   Cardiovascular: Negative for chest pain and palpitations.  Gastrointestinal: Negative for abdominal pain.  Musculoskeletal: Negative for back pain.  Skin: Negative for rash.  Neurological: Negative for dizziness, weakness and light-headedness.  Hematological: Positive for adenopathy. Does not bruise/bleed easily.  Psychiatric/Behavioral: Negative for behavioral problems and confusion.   Past Medical History:  Diagnosis Date  . 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 Constipation 12/18/2013  . Hyperglycemia 09/28/2014  . Hyperlipidemia, mixed 06/22/2014  . Infectious mononucleosis 07/18/2013  . Inguinal hernia 05/12/2012  . Low back pain 12/16/2013  . Overweight 06/22/2014  . Pelvic floor dysfunction   . Premature ventricular beat   . Splenomegaly 07/18/2013  . Tremulousness 08/14/2013     Social History   Socioeconomic History  . Marital status: Divorced    Spouse name: Not on file  . Number of children: Not on file  . Years of education: Not on file  . Highest education level: Not on file  Occupational History  . Not on file  Social Needs  . Financial resource strain: Not on file  . Food insecurity:    Worry: Not  on file    Inability: Not on file  . Transportation needs:    Medical: Not on file    Non-medical: Not on file  Tobacco Use  . Smoking status: Never Smoker  . Smokeless tobacco: Never Used  Substance and Sexual Activity  . Alcohol use: No  . Drug use: No  . 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 Physical activity:    Days per week: Not on file    Minutes per session: Not on file  . Stress: Not on file  Relationships  . Social connections:    Talks on phone: Not on file    Gets together: Not on file    Attends religious service: Not on file    Active member of club or organization: Not on file    Attends meetings of clubs or organizations: Not on file    Relationship status: Not on file  . Intimate partner violence:    Fear of current or ex partner: Not on file    Emotionally abused: Not on file    Physically abused: Not on file    Forced sexual activity: Not on file  Other Topics Concern  . Not on file  Social History Narrative   New baby on the way 07/15/16   Wife is 13 weeks    No past surgical history on file.  Family History  Problem  Relation Age of Onset  . Cancer Mother        Non Hodkins lymphoma, breast cancer  . Other Father        pituatary adenoma  . Cancer Paternal Uncle        recurrent prostate   . Heart disease Maternal Grandmother        chf  . Heart disease Maternal Grandfather        chf  . Heart disease Paternal Grandfather        chf  . Depression Brother   . Anemia Sister     Allergies  Allergen Reactions  . Guaifenesin     REACTION: PVCs  . Prednisone     PVC    Current Outpatient Medications on File Prior to Visit  Medication Sig Dispense Refill  . doxycycline (VIBRA-TABS) 100 MG tablet Take 1 tablet (100 mg total) by mouth 2 (two) times daily. 20 tablet 0  . tiZANidine (ZANAFLEX) 4 MG tablet Take 1 tablet (4 mg total) by mouth every 8  (eight) hours as needed for muscle spasms. 60 tablet 3  . trimethoprim-polymyxin b (POLYTRIM) ophthalmic solution Place 2 drops into the left eye 3 (three) times daily as needed. 10 mL 0   No current facility-administered medications on file prior to visit.     BP 128/86   Pulse 85   Temp 98.2 F (36.8 C) (Oral)   Resp 16   Ht 5\' 7"  (1.702 m)   Wt 196 lb 12.8 oz (89.3 kg)   SpO2 98%   BMI 30.82 kg/m       Objective:   Physical Exam  General  Mental Status - Alert. General Appearance - Well groomed. Not in acute distress.  Skin Rashes- No Rashes.  HEENT Head- Normal. Ear Auditory Canal - Left- Normal. Right - Normal.Tympanic Membrane- Left- Normal. Right- Normal. Eye Sclera/Conjunctiva- Left- Normal. Right- Normal. Nose & Sinuses Nasal Mucosa- Left-  Boggy and Congested. Right-  Boggy and  Congested.Bilateral maxillary and frontal sinus pressure. Mouth & Throat Lips: Upper Lip- Normal: no dryness, cracking, pallor, cyanosis, or vesicular eruption. Lower Lip-Normal: no dryness, cracking, pallor, cyanosis or vesicular eruption. Buccal Mucosa- Bilateral- No Aphthous ulcers. Oropharynx- No Discharge or Erythema. Tonsils: Characteristics- Bilateral- Mild Erythema. Size/Enlargement- Bilateral- No enlargement. Discharge- bilateral-None. Teeth- some teeth missing left lower jaw area. Mild poor dentition but no obvious caries.  Neck Neck- Supple. No Masses.   Chest and Lung Exam Auscultation: Breath Sounds:-Clear even and unlabored.  Cardiovascular Auscultation:Rythm- Regular, rate and rhythm. Murmurs & Other Heart Sounds:Ausculatation of the heart reveal- No Murmurs.  Lymphatic Head & Neck General Head & Neck Lymphatics: Left side submandibular node mild-moderate enlarged. Mild tender to palpation.      Assessment & Plan:  You do have swollen lymph node in the left submandibular region.  Etiology/cause of this  swelling is not clear presently.  You do have had  stye recently and just started antibiotics but not sure small stye would cause swelling of the lymph node to this degree.  Other differential causes could be early ear infection, tooth infection, or strep throat.  Your rapid strep test was negative and decided best to go ahead and send out throat culture as well.  Left ear tympanic membrane does not show overt infection.  You do have some poor dentition present.  Presently would recommend staying on doxycycline and we did give you Rocephin 1 g IM.  I want you to update me on Monday  to see if the tenderness or swelling has improved.  If area worsens before then please let us know as well.  If strep test comes back positive or left lower teeth become tender then might be best to switch to Augmentin at that point.  Follow-up in 7 to 10 days or as needed.  Also note if lymph node does not come down completely after treatment then would recommend repeating CBC which was normal during the summer.  Also for persisting lymph node swelling but no obvious cause what consider ultrasound of left nodes as well as ENT referral.  Esperanza Richters, PA-C

## 2017-12-16 NOTE — Patient Instructions (Addendum)
You do have swollen lymph node in the left submandibular region.  Etiology/cause of this  is not clear presently.  You do have had stye recently and just started antibiotics but not sure small stye would cause swelling of the lymph node to this degree.  Other differential causes could be early ear infection, tooth infection, or strep throat.  Your rapid strep test was negative and decided best to go ahead and send out throat culture as well.  Left ear tympanic membrane does not show overt infection.  You do have some poor dentition present.  Presently would recommend staying on doxycycline and we did give you Rocephin 1 g IM.  I want you to update me on Monday to see if the tenderness or swelling has improved.  If area worsens before then please let us know as well.  If strep test comes back positive or left lower teeth become tender then might be best to switch to Augmentin at that point.  Follow-up in 7 to 10 days or as needed.  Also note if lymph node does not come down completely after treatment then would recommend repeating CBC which was normal during the summer.  Also for persisting lymph node swelling but no obvious cause what consider ultrasound of left nodes as well as ENT referral.

## 2017-12-18 ENCOUNTER — Telehealth: Payer: Self-pay

## 2017-12-18 LAB — CULTURE, GROUP A STREP
MICRO NUMBER: 91214540
SPECIMEN QUALITY:: ADEQUATE

## 2017-12-18 NOTE — Telephone Encounter (Signed)
-----   Message from Esperanza Richters, PA-C sent at 12/18/2017  8:44 AM EDT ----- Pt throat culture test was negative.

## 2017-12-18 NOTE — Telephone Encounter (Signed)
Author phoned pt. to notify of results, no answer. Author left detailed VM.

## 2018-01-28 ENCOUNTER — Ambulatory Visit: Payer: No Typology Code available for payment source | Admitting: Family Medicine

## 2018-01-28 DIAGNOSIS — M6289 Other specified disorders of muscle: Secondary | ICD-10-CM | POA: Diagnosis not present

## 2018-01-28 DIAGNOSIS — R739 Hyperglycemia, unspecified: Secondary | ICD-10-CM

## 2018-01-28 DIAGNOSIS — K5900 Constipation, unspecified: Secondary | ICD-10-CM

## 2018-01-28 NOTE — Patient Instructions (Addendum)
 DASH Eating Plan DASH stands for "Dietary Approaches to Stop Hypertension." The DASH eating plan is a healthy eating plan that has been shown to reduce high blood pressure (hypertension). It may also reduce your risk for type 2 diabetes, heart disease, and stroke. The DASH eating plan may also help with weight loss. What are tips for following this plan? General guidelines  Avoid eating more than 2,300 mg (milligrams) of salt (sodium) a day. If you have hypertension, you may need to reduce your sodium intake to 1,500 mg a day.  Limit alcohol intake to no more than 1 drink a day for nonpregnant women and 2 drinks a day for men. One drink equals 12 oz of beer, 5 oz of wine, or 1 oz of hard liquor.  Work with your health care provider to maintain a healthy body weight or to lose weight. Ask what an ideal weight is for you.  Get at least 30 minutes of exercise that causes your heart to beat faster (aerobic exercise) most days of the week. Activities may include walking, swimming, or biking.  Work with your health care provider or diet and nutrition specialist (dietitian) to adjust your eating plan to your individual calorie needs. Reading food labels  Check food labels for the amount of sodium per serving. Choose foods with less than 5 percent of the Daily Value of sodium. Generally, foods with less than 300 mg of sodium per serving fit into this eating plan.  To find whole grains, look for the word "whole" as the first word in the ingredient list. Shopping  Buy products labeled as "low-sodium" or "no salt added."  Buy fresh foods. Avoid canned foods and premade or frozen meals. Cooking  Avoid adding salt when cooking. Use salt-free seasonings or herbs instead of table salt or sea salt. Check with your health care provider or pharmacist before using salt substitutes.  Do not fry foods. Cook foods using healthy methods such as baking, boiling, grilling, and broiling instead.  Cook with  heart-healthy oils, such as olive, canola, soybean, or sunflower oil. Meal planning   Eat a balanced diet that includes: ? 5 or more servings of fruits and vegetables each day. At each meal, try to fill half of your plate with fruits and vegetables. ? Up to 6-8 servings of whole grains each day. ? Less than 6 oz of lean meat, poultry, or fish each day. A 3-oz serving of meat is about the same size as a deck of cards. One egg equals 1 oz. ? 2 servings of low-fat dairy each day. ? A serving of nuts, seeds, or beans 5 times each week. ? Heart-healthy fats. Healthy fats called Omega-3 fatty acids are found in foods such as flaxseeds and coldwater fish, like sardines, salmon, and mackerel.  Limit how much you eat of the following: ? Canned or prepackaged foods. ? Food that is high in trans fat, such as fried foods. ? Food that is high in saturated fat, such as fatty meat. ? Sweets, desserts, sugary drinks, and other foods with added sugar. ? Full-fat dairy products.  Do not salt foods before eating.  Try to eat at least 2 vegetarian meals each week.  Eat more home-cooked food and less restaurant, buffet, and fast food.  When eating at a restaurant, ask that your food be prepared with less salt or no salt, if possible. What foods are recommended? The items listed may not be a complete list. Talk with your dietitian about   what dietary choices are best for you. Grains Whole-grain or whole-wheat bread. Whole-grain or whole-wheat pasta. Brown rice. Oatmeal. Quinoa. Bulgur. Whole-grain and low-sodium cereals. Pita bread. Low-fat, low-sodium crackers. Whole-wheat flour tortillas. Vegetables Fresh or frozen vegetables (raw, steamed, roasted, or grilled). Low-sodium or reduced-sodium tomato and vegetable juice. Low-sodium or reduced-sodium tomato sauce and tomato paste. Low-sodium or reduced-sodium canned vegetables. Fruits All fresh, dried, or frozen fruit. Canned fruit in natural juice (without  added sugar). Meat and other protein foods Skinless chicken or turkey. Ground chicken or turkey. Pork with fat trimmed off. Fish and seafood. Egg whites. Dried beans, peas, or lentils. Unsalted nuts, nut butters, and seeds. Unsalted canned beans. Lean cuts of beef with fat trimmed off. Low-sodium, lean deli meat. Dairy Low-fat (1%) or fat-free (skim) milk. Fat-free, low-fat, or reduced-fat cheeses. Nonfat, low-sodium ricotta or cottage cheese. Low-fat or nonfat yogurt. Low-fat, low-sodium cheese. Fats and oils Soft margarine without trans fats. Vegetable oil. Low-fat, reduced-fat, or light mayonnaise and salad dressings (reduced-sodium). Canola, safflower, olive, soybean, and sunflower oils. Avocado. Seasoning and other foods Herbs. Spices. Seasoning mixes without salt. Unsalted popcorn and pretzels. Fat-free sweets. What foods are not recommended? The items listed may not be a complete list. Talk with your dietitian about what dietary choices are best for you. Grains Baked goods made with fat, such as croissants, muffins, or some breads. Dry pasta or rice meal packs. Vegetables Creamed or fried vegetables. Vegetables in a cheese sauce. Regular canned vegetables (not low-sodium or reduced-sodium). Regular canned tomato sauce and paste (not low-sodium or reduced-sodium). Regular tomato and vegetable juice (not low-sodium or reduced-sodium). Pickles. Olives. Fruits Canned fruit in a light or heavy syrup. Fried fruit. Fruit in cream or butter sauce. Meat and other protein foods Fatty cuts of meat. Ribs. Fried meat. Bacon. Sausage. Bologna and other processed lunch meats. Salami. Fatback. Hotdogs. Bratwurst. Salted nuts and seeds. Canned beans with added salt. Canned or smoked fish. Whole eggs or egg yolks. Chicken or turkey with skin. Dairy Whole or 2% milk, cream, and half-and-half. Whole or full-fat cream cheese. Whole-fat or sweetened yogurt. Full-fat cheese. Nondairy creamers. Whipped toppings.  Processed cheese and cheese spreads. Fats and oils Butter. Stick margarine. Lard. Shortening. Ghee. Bacon fat. Tropical oils, such as coconut, palm kernel, or palm oil. Seasoning and other foods Salted popcorn and pretzels. Onion salt, garlic salt, seasoned salt, table salt, and sea salt. Worcestershire sauce. Tartar sauce. Barbecue sauce. Teriyaki sauce. Soy sauce, including reduced-sodium. Steak sauce. Canned and packaged gravies. Fish sauce. Oyster sauce. Cocktail sauce. Horseradish that you find on the shelf. Ketchup. Mustard. Meat flavorings and tenderizers. Bouillon cubes. Hot sauce and Tabasco sauce. Premade or packaged marinades. Premade or packaged taco seasonings. Relishes. Regular salad dressings. Where to find more information:  National Heart, Lung, and Blood Institute: www.nhlbi.nih.gov  American Heart Association: www.heart.org Summary  The DASH eating plan is a healthy eating plan that has been shown to reduce high blood pressure (hypertension). It may also reduce your risk for type 2 diabetes, heart disease, and stroke.  With the DASH eating plan, you should limit salt (sodium) intake to 2,300 mg a day. If you have hypertension, you may need to reduce your sodium intake to 1,500 mg a day.  When on the DASH eating plan, aim to eat more fresh fruits and vegetables, whole grains, lean proteins, low-fat dairy, and heart-healthy fats.  Work with your health care provider or diet and nutrition specialist (dietitian) to adjust your eating plan to your   individual calorie needs. This information is not intended to replace advice given to you by your health care provider. Make sure you discuss any questions you have with your health care provider. Document Released: 02/13/2011 Document Revised: 02/18/2016 Document Reviewed: 02/18/2016 Elsevier Interactive Patient Education  2018 [employer] Gastritis, Adult Gastritis is inflammation of the stomach. There are two kinds of  gastritis:  Acute gastritis. This kind develops suddenly.  Chronic gastritis. This kind lasts for a long time.  Gastritis happens when the lining of the stomach becomes weak or gets damaged. Without treatment, gastritis can lead to stomach bleeding and ulcers. What are the causes? This condition may be caused by:  An infection.  Drinking too much alcohol.  Certain medicines.  Having too much acid in the stomach.  A disease of the intestines or stomach.  Stress.  What are the signs or symptoms? Symptoms of this condition include:  Pain or a burning in the upper abdomen.  Nausea.  Vomiting.  An uncomfortable feeling of fullness after eating.  In some cases, there are no symptoms. How is this diagnosed? This condition may be diagnosed with:  A description of your symptoms.  A physical exam.  Tests. These can include: ? Blood tests. ? Stool tests. ? A test in which a thin, flexible instrument with a light and camera on the end is passed down the esophagus and into the stomach (upper endoscopy). ? A test in which a sample of tissue is taken for testing (biopsy).  How is this treated? This condition may be treated with medicines. If the condition is caused by a bacterial infection, you may be given antibiotic medicines. If it is caused by too much acid in the stomach, you may get medicines called H2 blockers, proton pump inhibitors, or antacids. Treatment may also involve stopping the use of certain medicines, such as aspirin, ibuprofen, or other nonsteroidal anti-inflammatory drugs (NSAIDs). Follow these instructions at home:  Take over-the-counter and prescription medicines only as told by your health care provider.  If you were prescribed an antibiotic, take it as told by your health care provider. Do not stop taking the antibiotic even if you start to feel better.  Drink enough fluid to keep your urine clear or pale yellow.  Eat small, frequent meals instead of  large meals. Contact a health care provider if:  Your symptoms get worse.  Your symptoms return after treatment. Get help right away if:  You vomit blood or material that looks like coffee grounds.  You have black or dark red stools.  You are unable to keep fluids down.  Your abdominal pain gets worse.  You have a fever.  You do not feel better after 1 week. This information is not intended to replace advice given to you by your health care provider. Make sure you discuss any questions you have with your health care provider. Document Released: 02/18/2001 Document Revised: 10/24/2015 Document Reviewed: 11/18/2014 Elsevier Interactive Patient Education  [employer]2018 [employer]vier Inc.

## 2018-01-28 NOTE — Assessment & Plan Note (Signed)
Bowels have been more frequent this week but no diarrhea.

## 2018-01-28 NOTE — Assessment & Plan Note (Signed)
Had a possible gastroenteritis over the weekend with some nausea, loose stool with some burning. This flares his pelvic pain but so far he has been avoiding taking in any meds.

## 2018-01-28 NOTE — Assessment & Plan Note (Signed)
hgba1c acceptable, minimize simple carbs. Increase exercise as tolerated.  

## 2018-01-31 NOTE — Progress Notes (Signed)
Subjective:    Patient ID: Jared Myers, male    DOB: May 22, 1980, 37 y.o.   MRN: [id number]  No chief complaint on file.   HPI Patient is in today for follow up. No recent febrile illness or hospitalizations. No polyuria or polydipsia but he is endorsing some increased pelvic pain since experiencing some constipation after traveling. They just returned from [facility]. Work is going well. Denies CP/palp/SOB/HA/congestion/fevers or GU c/o. Taking meds as prescribed  Past Medical History:  Diagnosis Date  . 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 Constipation 12/18/2013  . Hyperglycemia 09/28/2014  . Hyperlipidemia, mixed 06/22/2014  . Infectious mononucleosis 07/18/2013  . Inguinal hernia 05/12/2012  . Low back pain 12/16/2013  . Overweight 06/22/2014  . Pelvic floor dysfunction   . Premature ventricular beat   . Splenomegaly 07/18/2013  . Tremulousness 08/14/2013    No past surgical history on file.  Family History  Problem Relation Age of Onset  . Cancer Mother        Non Hodkins lymphoma, breast cancer  . Other Father        pituatary adenoma  . Cancer Paternal Uncle        recurrent prostate   . Heart disease Maternal Grandmother        chf  . Heart disease Maternal Grandfather        chf  . Heart disease Paternal Grandfather        chf  . Depression Brother   . Anemia Sister     Social History   Socioeconomic History  . Marital status: Divorced    Spouse name: Not on file  . Number of children: Not on file  . Years of education: Not on file  . Highest education level: Not on file  Occupational History  . Not on file  Social Needs  . Financial resource strain: Not on file  . Food insecurity:    Worry: Not on file    Inability: Not on file  . Transportation needs:    Medical: Not on file    Non-medical: Not on file  Tobacco Use  . Smoking status: Never Smoker  . Smokeless tobacco: Never Used  Substance and Sexual Activity  . Alcohol use: No    . Drug use: No  . 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 Physical activity:    Days per week: Not on file    Minutes per session: Not on file  . Stress: Not on file  Relationships  . Social connections:    Talks on phone: Not on file    Gets together: Not on file    Attends religious service: Not on file    Active member of club or organization: Not on file    Attends meetings of clubs or organizations: Not on file    Relationship status: Not on file  . Intimate partner violence:    Fear of current or ex partner: Not on file    Emotionally abused: Not on file    Physically abused: Not on file    Forced sexual activity: Not on file  Other Topics Concern  . Not on file  Social History Narrative   New baby on the way 07/15/16   Wife is 13 weeks    Outpatient Medications Prior to Visit  Medication Sig Dispense Refill  . tiZANidine (ZANAFLEX) 4 MG  tablet Take 1 tablet (4 mg total) by mouth every 8 (eight) hours as needed for muscle spasms. 60 tablet 3  . trimethoprim-polymyxin b (POLYTRIM) ophthalmic solution Place 2 drops into the left eye 3 (three) times daily as needed. 10 mL 0  . doxycycline (VIBRA-TABS) 100 MG tablet Take 1 tablet (100 mg total) by mouth 2 (two) times daily. 20 tablet 0   No facility-administered medications prior to visit.     Allergies  Allergen Reactions  . Guaifenesin     REACTION: PVCs  . Prednisone     PVC    Review of Systems  Constitutional: Negative for fever and malaise/fatigue.  HENT: Negative for congestion.   Eyes: Negative for blurred vision.  Respiratory: Negative for shortness of breath.   Cardiovascular: Negative for chest pain, palpitations and leg swelling.  Gastrointestinal: Positive for abdominal pain and constipation. Negative for blood in stool and nausea.  Genitourinary: Negative for dysuria and frequency.  Musculoskeletal: Negative for  falls.  Skin: Negative for rash.  Neurological: Negative for dizziness, loss of consciousness and headaches.  Endo/Heme/Allergies: Negative for environmental allergies.  Psychiatric/Behavioral: Negative for depression. The patient is not nervous/anxious.        Objective:    Physical Exam  Constitutional: He is oriented to person, place, and time. He appears well-developed and well-nourished. No distress.  HENT:  Head: Normocephalic and atraumatic.  Nose: Nose normal.  Eyes: Right eye exhibits no discharge. Left eye exhibits no discharge.  Neck: Normal range of motion. Neck supple.  Cardiovascular: Normal rate and regular rhythm.  No murmur heard. Pulmonary/Chest: Effort normal and breath sounds normal.  Abdominal: Soft. Bowel sounds are normal. There is no tenderness.  Musculoskeletal: He exhibits no edema.  Neurological: He is alert and oriented to person, place, and time.  Skin: Skin is warm and dry.  Psychiatric: He has a normal mood and affect.  Nursing note and vitals reviewed.   BP 112/78 (BP Location: Left Arm, Patient Position: Sitting, Cuff Size: Normal)   Pulse 93   Temp 97.6 F (36.4 C) (Oral)   Resp 18   Wt 197 lb 12.8 oz (89.7 kg)   SpO2 98%   BMI 30.98 kg/m  Wt Readings from Last 3 Encounters:  01/28/18 197 lb 12.8 oz (89.7 kg)  12/16/17 196 lb 12.8 oz (89.3 kg)  12/01/17 195 lb 9.6 oz (88.7 kg)     Lab Results  Component Value Date   WBC 6.5 10/27/2017   HGB 15.4 10/27/2017   HCT 44.5 10/27/2017   PLT 295.0 10/27/2017   GLUCOSE 88 10/27/2017   CHOL 178 10/27/2017   TRIG 170.0 (H) 10/27/2017   HDL 37.30 (L) 10/27/2017   LDLDIRECT 100.0 02/19/2017   LDLCALC 107 (H) 10/27/2017   ALT 22 10/27/2017   AST 17 10/27/2017   NA 138 10/27/2017   K 4.0 10/27/2017   CL 101 10/27/2017   CREATININE 1.03 10/27/2017   BUN 9 10/27/2017   CO2 29 10/27/2017   TSH 0.78 10/27/2017   PSA 0.52 01/24/2014   HGBA1C 5.2 10/27/2017    Lab Results  Component  Value Date   TSH 0.78 10/27/2017   Lab Results  Component Value Date   WBC 6.5 10/27/2017   HGB 15.4 10/27/2017   HCT 44.5 10/27/2017   MCV 87.0 10/27/2017   PLT 295.0 10/27/2017   Lab Results  Component Value Date   NA 138 10/27/2017   K 4.0 10/27/2017   CO2 29 10/27/2017  GLUCOSE 88 10/27/2017   BUN 9 10/27/2017   CREATININE 1.03 10/27/2017   BILITOT 0.6 10/27/2017   ALKPHOS 69 10/27/2017   AST 17 10/27/2017   ALT 22 10/27/2017   PROT 7.5 10/27/2017   ALBUMIN 4.7 10/27/2017   CALCIUM 9.8 10/27/2017   ANIONGAP 7 06/06/2014   GFR 86.11 10/27/2017   Lab Results  Component Value Date   CHOL 178 10/27/2017   Lab Results  Component Value Date   HDL 37.30 (L) 10/27/2017   Lab Results  Component Value Date   LDLCALC 107 (H) 10/27/2017   Lab Results  Component Value Date   TRIG 170.0 (H) 10/27/2017   Lab Results  Component Value Date   CHOLHDL 5 10/27/2017   Lab Results  Component Value Date   HGBA1C 5.2 10/27/2017       Assessment & Plan:   Problem List Items Addressed This Visit    Pelvic floor dysfunction    Had a possible gastroenteritis over the weekend with some nausea, loose stool with some burning. This flares his pelvic pain but so far he has been avoiding taking in any meds.       Constipation    Bowels have been more frequent this week but no diarrhea.       Hyperglycemia    hgba1c acceptable, minimize simple carbs. Increase exercise as tolerated.          I have discontinued Catrell A. Hawes's doxycycline. I am also having him maintain his tiZANidine and trimethoprim-polymyxin b.  No orders of the defined types were placed in this encounter.    Danise Edge, MD

## 2018-02-17 ENCOUNTER — Encounter: Payer: Self-pay | Admitting: Family Medicine

## 2018-02-19 ENCOUNTER — Telehealth: Payer: Self-pay

## 2018-02-19 NOTE — Telephone Encounter (Signed)
Copied from CRM [phone]. Topic: Quick Communication - See Telephone Encounter >> Feb 18, 2018  8:16 AM Mare LoanBurton, Donna F wrote: Pt is needing to talk with someone in regards tp his letter for work on 02/19/17 and needing a new copy with restrictions to keep him working during the day  Best number is [phone] if it goes to voice mail it will say angela Laural BenesJohnson but this will be the correct number for patient    Call left message for patient to call the office to let us know what he needs the letter to say.  Nurse triage may handle

## 2018-02-19 NOTE — Telephone Encounter (Signed)
Pt. Reports he needs essentially the same letter with the addition of him "remaining on daytime hours." Please e-mail him a copy to : [email or web address].

## 2018-02-22 NOTE — Telephone Encounter (Signed)
Mailed letter to patient

## 2018-02-23 ENCOUNTER — Encounter: Payer: Self-pay | Admitting: Family Medicine

## 2018-02-23 ENCOUNTER — Ambulatory Visit: Payer: No Typology Code available for payment source | Admitting: Family Medicine

## 2018-02-23 VITALS — BP 106/82 | HR 88 | Temp 98.0°F | Resp 18 | Wt 197.2 lb

## 2018-02-23 DIAGNOSIS — E663 Overweight: Secondary | ICD-10-CM

## 2018-02-23 DIAGNOSIS — E782 Mixed hyperlipidemia: Secondary | ICD-10-CM | POA: Diagnosis not present

## 2018-02-23 DIAGNOSIS — I889 Nonspecific lymphadenitis, unspecified: Secondary | ICD-10-CM

## 2018-02-23 DIAGNOSIS — R739 Hyperglycemia, unspecified: Secondary | ICD-10-CM | POA: Diagnosis not present

## 2018-02-23 LAB — CBC WITH DIFFERENTIAL/PLATELET
BASOS PCT: 0.6 % (ref 0.0–3.0)
Basophils Absolute: 0 10*3/uL (ref 0.0–0.1)
EOS ABS: 0.1 10*3/uL (ref 0.0–0.7)
Eosinophils Relative: 1.2 % (ref 0.0–5.0)
HCT: 43.6 % (ref 39.0–52.0)
Hemoglobin: 15.4 g/dL (ref 13.0–17.0)
LYMPHS ABS: 2.2 10*3/uL (ref 0.7–4.0)
Lymphocytes Relative: 34.9 % (ref 12.0–46.0)
MCHC: 35.2 g/dL (ref 30.0–36.0)
MCV: 87 fl (ref 78.0–100.0)
MONO ABS: 0.5 10*3/uL (ref 0.1–1.0)
Monocytes Relative: 7.7 % (ref 3.0–12.0)
NEUTROS PCT: 55.6 % (ref 43.0–77.0)
Neutro Abs: 3.5 10*3/uL (ref 1.4–7.7)
Platelets: 273 10*3/uL (ref 150.0–400.0)
RBC: 5.01 Mil/uL (ref 4.22–5.81)
RDW: 12.9 % (ref 11.5–15.5)
WBC: 6.4 10*3/uL (ref 4.0–10.5)

## 2018-02-23 LAB — COMPREHENSIVE METABOLIC PANEL
ALT: 19 U/L (ref 0–53)
AST: 15 U/L (ref 0–37)
Albumin: 4.6 g/dL (ref 3.5–5.2)
Alkaline Phosphatase: 71 U/L (ref 39–117)
BUN: 12 mg/dL (ref 6–23)
CHLORIDE: 100 meq/L (ref 96–112)
CO2: 31 meq/L (ref 19–32)
Calcium: 9.6 mg/dL (ref 8.4–10.5)
Creatinine, Ser: 1.16 mg/dL (ref 0.40–1.50)
GFR: 74.94 mL/min (ref >60.00)
GLUCOSE: 101 mg/dL — AB (ref 70–99)
POTASSIUM: 4 meq/L (ref 3.5–5.1)
SODIUM: 138 meq/L (ref 135–145)
Total Bilirubin: 0.6 mg/dL (ref 0.2–1.2)
Total Protein: 7 g/dL (ref 6.0–8.3)

## 2018-02-23 MED ORDER — METHYLPREDNISOLONE 4 MG PO TABS: ORAL_TABLET | ORAL | 0 refills | Status: DC

## 2018-02-23 MED ORDER — CEPHALEXIN 500 MG PO CAPS: 500.0000 mg | ORAL_CAPSULE | Freq: Four times a day (QID) | ORAL | 0 refills | Status: DC

## 2018-02-23 MED FILL — CEPHALEXIN 500 MG CAPSULE: 500 | 10 days supply | Qty: 40 | Fill #0

## 2018-02-23 MED FILL — METHYLPREDNISOLONE 4 MG TAB: 4 | 5 days supply | Qty: 15 | Fill #0

## 2018-02-23 NOTE — Patient Instructions (Signed)
Lymphadenopathy Lymphadenopathy refers to swollen or enlarged lymph glands, also called lymph nodes. Lymph glands are part of your body's defense (immune) system, which protects the body from infections, germs, and diseases. Lymph glands are found in many locations in your body, including the neck, underarm, and groin. Many things can cause lymph glands to become enlarged. When your immune system responds to germs, such as viruses or bacteria, infection-fighting cells and fluid build up. This causes the glands to grow in size. Usually, this is not something to worry about. The swelling and any soreness often go away without treatment. However, swollen lymph glands can also be caused by a number of diseases. Your health care provider may do various tests to help determine the cause. If the cause of your swollen lymph glands cannot be found, it is important to monitor your condition to make sure the swelling goes away. Follow these instructions at home: Watch your condition for any changes. The following actions may help to lessen any discomfort you are feeling:  Get plenty of rest.  Take medicines only as directed by your health care provider. Your health care provider may recommend over-the-counter medicines for pain.  Apply moist heat compresses to the site of swollen lymph nodes as directed by your health care provider. This can help reduce any pain.  Check your lymph nodes daily for any changes.  Keep all follow-up visits as directed by your health care provider. This is important.  Contact a health care provider if:  Your lymph nodes are still swollen after 2 weeks.  Your swelling increases or spreads to other areas.  Your lymph nodes are hard, seem fixed to the skin, or are growing rapidly.  Your skin over the lymph nodes is red and inflamed.  You have a fever.  You have chills.  You have fatigue.  You develop a sore throat.  You have abdominal pain.  You have weight  loss.  You have night sweats. Get help right away if:  You notice fluid leaking from the area of the enlarged lymph node.  You have severe pain in any area of your body.  You have chest pain.  You have shortness of breath. This information is not intended to replace advice given to you by your health care provider. Make sure you discuss any questions you have with your health care provider. Document Released: 12/04/2007 Document Revised: 08/02/2015 Document Reviewed: 09/29/2013 Elsevier Interactive Patient Education  2018 Elsevier Inc.  

## 2018-02-25 DIAGNOSIS — I889 Nonspecific lymphadenitis, unspecified: Secondary | ICD-10-CM | POA: Insufficient documentation

## 2018-02-25 NOTE — Assessment & Plan Note (Signed)
Encouraged DASH diet, decrease po intake and increase exercise as tolerated. Needs 7-8 hours of sleep nightly. Avoid trans fats, eat small, frequent meals every 4-5 hours with lean proteins, complex carbs and healthy fats. Minimize simple carbs 

## 2018-02-25 NOTE — Progress Notes (Signed)
Subjective:    Patient ID: Jared Myers, male    DOB: 10-25-1980, 37 y.o.   MRN: [id number]  No chief complaint on file.   HPI Patient is in today for evaluation of a sore throat and swollen lymph nodes.  He was treated once already and did somewhat better but now he has a painful swollen lymph node on the left side of his throat and a sore throat.  Also endorses some fatigue and malaise but no obvious fevers and chills at this point. No recent hospitalization or other acute concern. Denies CP/palp/SOB/HA/congestion/fevers/GI or GU c/o. Taking meds as prescribed  Past Medical History:  Diagnosis Date  . 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 Constipation 12/18/2013  . Hyperglycemia 09/28/2014  . Hyperlipidemia, mixed 06/22/2014  . Infectious mononucleosis 07/18/2013  . Inguinal hernia 05/12/2012  . Low back pain 12/16/2013  . Overweight 06/22/2014  . Pelvic floor dysfunction   . Premature ventricular beat   . Splenomegaly 07/18/2013  . Tremulousness 08/14/2013    No past surgical history on file.  Family History  Problem Relation Age of Onset  . Cancer Mother        Non Hodkins lymphoma, breast cancer  . Other Father        pituatary adenoma  . Cancer Paternal Uncle        recurrent prostate   . Heart disease Maternal Grandmother        chf  . Heart disease Maternal Grandfather        chf  . Heart disease Paternal Grandfather        chf  . Depression Brother   . Anemia Sister     Social History   Socioeconomic History  . Marital status: Divorced    Spouse name: Not on file  . Number of children: Not on file  . Years of education: Not on file  . Highest education level: Not on file  Occupational History  . Not on file  Social Needs  . Financial resource strain: Not on file  . Food insecurity:    Worry: Not on file    Inability: Not on file  . Transportation needs:    Medical: Not on file    Non-medical: Not on file  Tobacco Use  . Smoking status:  Never Smoker  . Smokeless tobacco: Never Used  Substance and Sexual Activity  . Alcohol use: No  . Drug use: No  . 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 Physical activity:    Days per week: Not on file    Minutes per session: Not on file  . Stress: Not on file  Relationships  . Social connections:    Talks on phone: Not on file    Gets together: Not on file    Attends religious service: Not on file    Active member of club or organization: Not on file    Attends meetings of clubs or organizations: Not on file    Relationship status: Not on file  . Intimate partner violence:    Fear of current or ex partner: Not on file    Emotionally abused: Not on file    Physically abused: Not on file    Forced sexual activity: Not on file  Other Topics Concern  . Not on file  Social History Narrative   New baby on the way 07/15/16  Wife is 13 weeks    Outpatient Medications Prior to Visit  Medication Sig Dispense Refill  . tiZANidine (ZANAFLEX) 4 MG tablet Take 1 tablet (4 mg total) by mouth every 8 (eight) hours as needed for muscle spasms. 60 tablet 3  . trimethoprim-polymyxin b (POLYTRIM) ophthalmic solution Place 2 drops into the left eye 3 (three) times daily as needed. 10 mL 0   No facility-administered medications prior to visit.     Allergies  Allergen Reactions  . Guaifenesin     REACTION: PVCs  . Prednisone     PVC    Review of Systems  Constitutional: Positive for malaise/fatigue. Negative for fever.  HENT: Positive for sore throat. Negative for congestion.   Eyes: Negative for blurred vision.  Respiratory: Negative for shortness of breath.   Cardiovascular: Negative for chest pain, palpitations and leg swelling.  Gastrointestinal: Negative for abdominal pain, blood in stool and nausea.  Genitourinary: Negative for dysuria and frequency.  Musculoskeletal: Negative for falls.    Skin: Negative for rash.  Neurological: Negative for dizziness, loss of consciousness and headaches.  Endo/Heme/Allergies: Negative for environmental allergies.  Psychiatric/Behavioral: Negative for depression. The patient is not nervous/anxious.        Objective:    Physical Exam Vitals signs and nursing note reviewed.  Constitutional:      General: He is not in acute distress.    Appearance: He is well-developed.  HENT:     Head: Normocephalic and atraumatic.     Nose: Nose normal.     Mouth/Throat:     Comments: Left sided tender enlarged lymph node submandibular, oropharynx erythematous Eyes:     General:        Right eye: No discharge.        Left eye: No discharge.  Neck:     Musculoskeletal: Normal range of motion and neck supple.  Cardiovascular:     Rate and Rhythm: Normal rate and regular rhythm.     Heart sounds: No murmur.  Pulmonary:     Effort: Pulmonary effort is normal.     Breath sounds: Normal breath sounds.  Abdominal:     General: Bowel sounds are normal.     Palpations: Abdomen is soft.     Tenderness: There is no abdominal tenderness.  Skin:    General: Skin is warm and dry.  Neurological:     Mental Status: He is alert and oriented to person, place, and time.     BP 106/82 (BP Location: Left Arm, Patient Position: Sitting, Cuff Size: Normal)   Pulse 88   Temp 98 F (36.7 C) (Oral)   Resp 18   Wt 197 lb 3.2 oz (89.4 kg)   SpO2 98%   BMI 30.89 kg/m  Wt Readings from Last 3 Encounters:  02/23/18 197 lb 3.2 oz (89.4 kg)  01/28/18 197 lb 12.8 oz (89.7 kg)  12/16/17 196 lb 12.8 oz (89.3 kg)     Lab Results  Component Value Date   WBC 6.4 02/23/2018   HGB 15.4 02/23/2018   HCT 43.6 02/23/2018   PLT 273.0 02/23/2018   GLUCOSE 101 (H) 02/23/2018   CHOL 178 10/27/2017   TRIG 170.0 (H) 10/27/2017   HDL 37.30 (L) 10/27/2017   LDLDIRECT 100.0 02/19/2017   LDLCALC 107 (H) 10/27/2017   ALT 19 02/23/2018   AST 15 02/23/2018   NA 138  02/23/2018   K 4.0 02/23/2018   CL 100 02/23/2018   CREATININE 1.16 02/23/2018  BUN 12 02/23/2018   CO2 31 02/23/2018   TSH 0.78 10/27/2017   PSA 0.52 01/24/2014   HGBA1C 5.2 10/27/2017    Lab Results  Component Value Date   TSH 0.78 10/27/2017   Lab Results  Component Value Date   WBC 6.4 02/23/2018   HGB 15.4 02/23/2018   HCT 43.6 02/23/2018   MCV 87.0 02/23/2018   PLT 273.0 02/23/2018   Lab Results  Component Value Date   NA 138 02/23/2018   K 4.0 02/23/2018   CO2 31 02/23/2018   GLUCOSE 101 (H) 02/23/2018   BUN 12 02/23/2018   CREATININE 1.16 02/23/2018   BILITOT 0.6 02/23/2018   ALKPHOS 71 02/23/2018   AST 15 02/23/2018   ALT 19 02/23/2018   PROT 7.0 02/23/2018   ALBUMIN 4.6 02/23/2018   CALCIUM 9.6 02/23/2018   ANIONGAP 7 06/06/2014   GFR 74.94 02/23/2018   Lab Results  Component Value Date   CHOL 178 10/27/2017   Lab Results  Component Value Date   HDL 37.30 (L) 10/27/2017   Lab Results  Component Value Date   LDLCALC 107 (H) 10/27/2017   Lab Results  Component Value Date   TRIG 170.0 (H) 10/27/2017   Lab Results  Component Value Date   CHOLHDL 5 10/27/2017   Lab Results  Component Value Date   HGBA1C 5.2 10/27/2017       Assessment & Plan:   Problem List Items Addressed This Visit    Hyperlipidemia, mixed    Encouraged heart healthy diet, increase exercise, avoid trans fats, consider a krill oil cap daily      Overweight    Encouraged DASH diet, decrease po intake and increase exercise as tolerated. Needs 7-8 hours of sleep nightly. Avoid trans fats, eat small, frequent meals every 4-5 hours with lean proteins, complex carbs and healthy fats. Minimize simple carbs      Hyperglycemia    hgba1c acceptable, minimize simple carbs. Increase exercise as tolerated.      Lymphadenitis - Primary    Started on antibiotics and probiotics and report if no relief      Relevant Orders   CBC with Differential/Platelet (Completed)    Comprehensive metabolic panel (Completed)      I am having Jared Myers start on cephALEXin and methylPREDNISolone. I am also having him maintain his tiZANidine and trimethoprim-polymyxin b.  Meds ordered this encounter  Medications  . cephALEXin (KEFLEX) 500 MG capsule    Sig: Take 1 capsule (500 mg total) by mouth 4 (four) times daily.    Dispense:  40 capsule    Refill:  0  . methylPREDNISolone (MEDROL) 4 MG tablet    Sig: 5 tab po qd X 1d then 4 tab po qd X 1d then 3 tab po qd X 1d then 2 tab po qd then 1 tab po qd    Dispense:  15 tablet    Refill:  0     Danise Edge, MD

## 2018-02-25 NOTE — Assessment & Plan Note (Signed)
Started on antibiotics and probiotics and report if no relief

## 2018-02-25 NOTE — Assessment & Plan Note (Signed)
Encouraged heart healthy diet, increase exercise, avoid trans fats, consider a krill oil cap daily 

## 2018-02-25 NOTE — Assessment & Plan Note (Signed)
hgba1c acceptable, minimize simple carbs. Increase exercise as tolerated.  

## 2018-03-12 ENCOUNTER — Ambulatory Visit: Payer: Self-pay

## 2018-03-12 NOTE — Telephone Encounter (Signed)
Ret'd call to pt.  Reported he has 3 pills left of his antibiotic rx.  Reported that he never started taking the steroid, but still has a "pulling sensation" in the lymph node of left jaw area; asked if he should start taking this now?  Denied any discomfort with swallowing.  Advised to complete his antibiotic.  Will send message to Dr. Charlett Blake re: question of taking the steroid at this time.   Pt. reported onset of nasal congestion/ drainage on 12/29.  Stated the nasal drainage is drying up, since taking "Zarbee".  C/o scratchy throat, and headache today.  Stated has infreq. dry cough.  C/o intermittent sneezing that causes generalized back pain; "it feels like several knives are poking in my back when I sneeze."   Also, reported he feels tired.  Reported his "schedule was crazy during the holidays."  Home care advice given for cold symptoms.  Encouraged to call for appt., if symptoms worsen.  Verb. Understanding.            Reason for Disposition . Cold with no complications  Answer Assessment - Initial Assessment Questions 1. SYMPTOMS: "Do you have any symptoms?"     Feels pulling in area lymph node, with turning head/neck  2. SEVERITY: If symptoms are present, ask "Are they mild, moderate or severe?"    " It's about the same"; it no longer hurts with swallowing or chewing. ; denied fever;   C/o onset of sore throat; nasal congestion; has clear mucus drainage; using "Zarbee"  Answer Assessment - Initial Assessment Questions 1. ONSET: "When did the nasal discharge start?"      Nasal drainage began on 12/29 2. AMOUNT: "How much discharge is there?"      Drying up at this time; has been using Zarbee 3. COUGH: "Do you have a cough?" If yes, ask: "Describe the color of your sputum" (clear, white, yellow, green)     Cough infreq. ; nonproductive 4. RESPIRATORY DISTRESS: "Describe your breathing."      denied 5. FEVER: "Do you have a fever?" If so, ask: "What is your temperature, how was it  measured, and when did it start?"     denied 6. SEVERITY: "Overall, how bad are you feeling right now?" (e.g., doesn't interfere with normal activities, staying home from school/work, staying in bed)      Feeling tired; feels worn down.  7. OTHER SYMPTOMS: "Do you have any other symptoms?" (e.g., sore throat, earache, wheezing, vomiting)     Sinus pressure; overall back pain with sneezing, sore throat  Protocols used: COMMON COLD-A-AH, MEDICATION QUESTION CALL-A-AH  Message from Keene Breath sent at 03/12/2018 8:55 AM EST   Patient called to ask if he should continue to take his antibiotic with his steroid medication. He stated that he only has 3 pills left and missed his dosage of the steroid medication. Patient wanted to know if he should still take the steroid medication now. Please advise and call him back at [phone]. If the VM says Marshell Garfinkel, this is the patient's VM, he stated he has not had a chance to change it.

## 2018-03-12 NOTE — Telephone Encounter (Signed)
Please advise 

## 2018-03-14 NOTE — Telephone Encounter (Signed)
He should try the steroid and if no improvement by end of the week he should let us know.

## 2018-03-17 NOTE — Telephone Encounter (Signed)
The patient called back and I did inform of PCP instructions regarding the steroid and he agreed/understood instructions. Will start today 03/17/2018.

## 2018-03-17 NOTE — Telephone Encounter (Signed)
Called the patient at the number given in this note, but he did not answer/ had to leave a detailed message to give [place] a call.

## 2018-03-23 ENCOUNTER — Ambulatory Visit: Payer: Self-pay

## 2018-03-23 ENCOUNTER — Other Ambulatory Visit: Payer: Self-pay | Admitting: Family Medicine

## 2018-03-23 DIAGNOSIS — L049 Acute lymphadenitis, unspecified: Secondary | ICD-10-CM

## 2018-03-23 NOTE — Telephone Encounter (Signed)
I went ahead and ordered the CT of his neck due to the fact that he has been treated twice without resolution please let him know.

## 2018-03-23 NOTE — Telephone Encounter (Signed)
Patient called, he says that he saw Dr. Abner GreenspanBlyth on 02/23/18 for Lymphadenitis, was given steroid and antibiotic. He says that he is inflamed again, has sore throat, earache, gland pulls and throbs. He says he finished the antibiotic on yesterday and feels the glands are swelling again. He wanted to know what the next step will be concerning these issues. He says it was mentioned about having a CT scan if the antibiotics didn't work.  I advised him to come into the office to discuss all options and evaluation of recurring symptoms, since it's only been 24 hours after the antibiotic, he agrees. Appointment scheduled for Thursday, 03/25/18 at 1015 with Dr. Abner GreenspanBlyth. I advised I will send this to Dr. Abner GreenspanBlyth for her recommendation and advised if she decides no appointment is necessary, someone will call with the recommendations from her, he verbalized understanding. Call back # [phone]. He says if the VM come on it may say Marylene Landngela something but it's his VM and message can be left.  Reason for Disposition . Health Information question, no triage required and triager able to answer question  Protocols used: INFORMATION ONLY CALL-A-AH

## 2018-03-23 NOTE — Telephone Encounter (Signed)
Please advise 

## 2018-03-25 ENCOUNTER — Ambulatory Visit (HOSPITAL_BASED_OUTPATIENT_CLINIC_OR_DEPARTMENT_OTHER)
Admission: RE | Admit: 2018-03-25 | Discharge: 2018-03-25 | Disposition: A | Discharge status: Home / Self Care | Payer: No Typology Code available for payment source | Source: Ambulatory Visit | Attending: Family Medicine | Admitting: Family Medicine

## 2018-03-25 ENCOUNTER — Encounter: Payer: Self-pay | Admitting: Family Medicine

## 2018-03-25 ENCOUNTER — Ambulatory Visit: Payer: No Typology Code available for payment source | Admitting: Family Medicine

## 2018-03-25 ENCOUNTER — Other Ambulatory Visit: Payer: Self-pay | Admitting: Family Medicine

## 2018-03-25 VITALS — BP 108/72 | HR 77 | Temp 98.1°F | Resp 18 | Wt 197.0 lb

## 2018-03-25 DIAGNOSIS — R591 Generalized enlarged lymph nodes: Secondary | ICD-10-CM

## 2018-03-25 DIAGNOSIS — I889 Nonspecific lymphadenitis, unspecified: Secondary | ICD-10-CM | POA: Diagnosis not present

## 2018-03-25 DIAGNOSIS — L049 Acute lymphadenitis, unspecified: Secondary | ICD-10-CM

## 2018-03-25 DIAGNOSIS — R739 Hyperglycemia, unspecified: Secondary | ICD-10-CM | POA: Diagnosis not present

## 2018-03-25 LAB — CBC WITH DIFFERENTIAL/PLATELET
Basophils Absolute: 0 10*3/uL (ref 0.0–0.1)
Basophils Relative: 0.4 % (ref 0.0–3.0)
EOS ABS: 0.1 10*3/uL (ref 0.0–0.7)
EOS PCT: 1.3 % (ref 0.0–5.0)
HEMATOCRIT: 45.5 % (ref 39.0–52.0)
HEMOGLOBIN: 15.7 g/dL (ref 13.0–17.0)
LYMPHS PCT: 31.2 % (ref 12.0–46.0)
Lymphs Abs: 2.3 10*3/uL (ref 0.7–4.0)
MCHC: 34.5 g/dL (ref 30.0–36.0)
MCV: 87.5 fl (ref 78.0–100.0)
Monocytes Absolute: 0.9 10*3/uL (ref 0.1–1.0)
Monocytes Relative: 12 % (ref 3.0–12.0)
Neutro Abs: 4.1 10*3/uL (ref 1.4–7.7)
Neutrophils Relative %: 55.1 % (ref 43.0–77.0)
PLATELETS: 292 10*3/uL (ref 150.0–400.0)
RBC: 5.2 Mil/uL (ref 4.22–5.81)
RDW: 13 % (ref 11.5–15.5)
WBC: 7.4 10*3/uL (ref 4.0–10.5)

## 2018-03-25 MED ORDER — IOPAMIDOL (ISOVUE-300) INJECTION 61%
100.0000 mL | Freq: Once | INTRAVENOUS | Status: AC | PRN
  Administered 2018-03-25: 75 mL via INTRAVENOUS

## 2018-03-25 MED ORDER — METHYLPREDNISOLONE 4 MG PO TABS: ORAL_TABLET | ORAL | 0 refills | Status: DC

## 2018-03-25 MED ORDER — CLINDAMYCIN HCL 300 MG PO CAPS: 300.0000 mg | ORAL_CAPSULE | Freq: Three times a day (TID) | ORAL | 0 refills | Status: DC

## 2018-03-25 NOTE — Telephone Encounter (Signed)
Patient notified

## 2018-03-25 NOTE — Patient Instructions (Signed)
Lymphadenopathy    Lymphadenopathy means that your lymph glands are swollen or larger than normal (enlarged). Lymph glands, also called lymph nodes, are collections of tissue that filter bacteria, viruses, and waste from your bloodstream. They are part of your body's disease-fighting system (immune system), which protects your body from germs.  There may be different causes of lymphadenopathy, depending on where it is in your body. Some types go away on their own. Lymphadenopathy can occur anywhere that you have lymph glands, including these areas:  · Neck (cervical lymphadenopathy).  · Chest (mediastinal lymphadenopathy).  · Lungs (hilar lymphadenopathy).  · Underarms (axillary lymphadenopathy).  · Groin (inguinal lymphadenopathy).  When your immune system responds to germs, infection-fighting cells and fluid build up in your lymph glands. This causes some swelling and enlargement. If the lymph glands do not go back to normal after you have an infection or disease, your health care provider may do tests. These tests help to monitor your condition and find the reason why the glands are still swollen and enlarged.  Follow these instructions at home:  · Get plenty of rest.  · Take over-the-counter and prescription medicines only as told by your health care provider. Your health care provider may recommend over-the-counter medicines for pain.  · If directed, apply heat to swollen lymph glands as often as told by your health care provider. Use the heat source that your health care provider recommends, such as a moist heat pack or a heating pad.  ? Place a towel between your skin and the heat source.  ? Leave the heat on for 20-30 minutes.  ? Remove the heat if your skin turns bright red. This is especially important if you are unable to feel pain, heat, or cold. You may have a greater risk of getting burned.  · Check your affected lymph glands every day for changes. Check other lymph gland areas as told by your health  care provider. Check for changes such as:  ? More swelling.  ? Sudden increase in size.  ? Redness or pain.  ? Hardness.  · Keep all follow-up visits as told by your health care provider. This is important.  Contact a health care provider if you have:  · Swelling that gets worse or spreads to other areas.  · Problems with breathing.  · Lymph glands that:  ? Are still swollen after 2 weeks.  ? Have suddenly gotten bigger.  ? Are red, painful, or hard.  · A fever or chills.  · Fatigue.  · A sore throat.  · Pain in your abdomen.  · Weight loss.  · Night sweats.  Get help right away if you have:  · Fluid leaking from an enlarged lymph gland.  · Severe pain.  · Chest pain.  · Shortness of breath.  Summary  · Lymphadenopathy means that your lymph glands are swollen or larger than normal (enlarged).  · Lymph glands (also called lymph nodes) are collections of tissue that filter bacteria, viruses, and waste from the bloodstream. They are part of your body's disease-fighting system (immune system).  · Lymphadenopathy can occur anywhere that you have lymph glands.  · If your enlarged and swollen lymph glands do not go back to normal after you have an infection or disease, your health care provider may do tests to monitor your condition and find the reason why the glands are still swollen and enlarged.  · Check your affected lymph glands every day for changes. Check other lymph   gland areas as told by your health care provider.  This information is not intended to replace advice given to you by your health care provider. Make sure you discuss any questions you have with your health care provider.  Document Released: 12/04/2007 Document Revised: 01/09/2017 Document Reviewed: 01/09/2017  [employer]vier Interactive Patient Education © 2019 [employer]

## 2018-03-25 NOTE — Assessment & Plan Note (Addendum)
CT neck due to the recurrent nature of the swelling LN, CT shows no concerning pathology. Will try to retreat with antibiotics, Clindamycin and Medrol dosepak simultaneously.

## 2018-03-25 NOTE — Progress Notes (Signed)
Subjective:    Patient ID: Jared Myers, male    DOB: [birth year], 38 y.o.   MRN: [id number]  No chief complaint on file.   HPI Patient is in today for reevaluation of sore throat lymphadenopathy.  He was recently and and treated with steroids and antibiotics but did not take them together.  Took the antibiotic and then the steroid.  Got some partial relief but now symptoms are worsening again.  He notes worsening sore throat, ear pain, swollen lymph nodes, congestion and runny nose.  Rhinorrhea largely clear.  No fevers or chills.  Does note some fatigue and malaise. Denies CP/palp/SOB/HA/fevers/GI or GU c/o. Taking meds as prescribed  Past Medical History:  Diagnosis Date  . 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 Constipation 12/18/2013  . Hyperglycemia 09/28/2014  . Hyperlipidemia, mixed 06/22/2014  . Infectious mononucleosis 07/18/2013  . Inguinal hernia 05/12/2012  . Low back pain 12/16/2013  . Overweight 06/22/2014  . Pelvic floor dysfunction   . Premature ventricular beat   . Splenomegaly 07/18/2013  . Tremulousness 08/14/2013    History reviewed. No pertinent surgical history.  Family History  Problem Relation Age of Onset  . Cancer Mother        Non Hodkins lymphoma, breast cancer  . Other Father        pituatary adenoma  . Cancer Paternal Uncle        recurrent prostate   . Heart disease Maternal Grandmother        chf  . Heart disease Maternal Grandfather        chf  . Heart disease Paternal Grandfather        chf  . Depression Brother   . Anemia Sister     Social History   Socioeconomic History  . Marital status: Divorced    Spouse name: Not on file  . Number of children: Not on file  . Years of education: Not on file  . Highest education level: Not on file  Occupational History  . Not on file  Social Needs  . Financial resource strain: Not on file  . Food insecurity:    Worry: Not on file    Inability: Not on file  . Transportation needs:    Medical: Not on file    Non-medical: Not on file  Tobacco Use  . Smoking status: Never Smoker  . Smokeless tobacco: Never Used  Substance and Sexual Activity  . Alcohol use: No  . Drug use: No  . 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 Physical activity:    Days per week: Not on file    Minutes per session: Not on file  . Stress: Not on file  Relationships  . Social connections:    Talks on phone: Not on file    Gets together: Not on file    Attends religious service: Not on file    Active member of club or organization: Not on file    Attends meetings of clubs or organizations: Not on file    Relationship status: Not on file  . Intimate partner violence:    Fear of current or ex partner: Not on file    Emotionally abused: Not on file    Physically abused: Not on file    Forced sexual activity: Not on file  Other Topics Concern  . Not on file  Social History  Narrative   New baby on the way 07/15/16   Wife is 13 weeks    Outpatient Medications Prior to Visit  Medication Sig Dispense Refill  . tiZANidine (ZANAFLEX) 4 MG tablet Take 1 tablet (4 mg total) by mouth every 8 (eight) hours as needed for muscle spasms. 60 tablet 3  . trimethoprim-polymyxin b (POLYTRIM) ophthalmic solution Place 2 drops into the left eye 3 (three) times daily as needed. 10 mL 0  . cephALEXin (KEFLEX) 500 MG capsule Take 1 capsule (500 mg total) by mouth 4 (four) times daily. 40 capsule 0  . methylPREDNISolone (MEDROL) 4 MG tablet 5 tab po qd X 1d then 4 tab po qd X 1d then 3 tab po qd X 1d then 2 tab po qd then 1 tab po qd 15 tablet 0   No facility-administered medications prior to visit.     Allergies  Allergen Reactions  . Guaifenesin     REACTION: PVCs  . Prednisone     PVC    Review of Systems  Constitutional: Positive for malaise/fatigue. Negative for chills and fever.  HENT: Positive for  congestion, ear pain and sore throat. Negative for ear discharge and tinnitus.   Eyes: Negative for blurred vision.  Respiratory: Positive for cough and sputum production. Negative for shortness of breath.   Cardiovascular: Negative for chest pain, palpitations and leg swelling.  Gastrointestinal: Negative for abdominal pain, blood in stool and nausea.  Genitourinary: Negative for dysuria and frequency.  Musculoskeletal: Positive for neck pain. Negative for falls.  Skin: Negative for rash.  Neurological: Negative for dizziness, loss of consciousness and headaches.  Endo/Heme/Allergies: Negative for environmental allergies.  Psychiatric/Behavioral: Negative for depression. The patient is not nervous/anxious.        Objective:    Physical Exam Vitals signs and nursing note reviewed.  Constitutional:      General: He is not in acute distress.    Appearance: He is well-developed.  HENT:     Head: Normocephalic and atraumatic.     Ears:     Comments: TMs dull and mildly erythematous. Enlarged lymph node behind L>R ear. Some cervical lymphadenopathy also noted.    Nose: Nose normal.  Eyes:     General:        Right eye: No discharge.        Left eye: No discharge.  Neck:     Musculoskeletal: Normal range of motion and neck supple.  Cardiovascular:     Rate and Rhythm: Normal rate and regular rhythm.     Heart sounds: No murmur.  Pulmonary:     Effort: Pulmonary effort is normal.     Breath sounds: Normal breath sounds.  Abdominal:     General: Bowel sounds are normal.     Palpations: Abdomen is soft.     Tenderness: There is no abdominal tenderness.  Skin:    General: Skin is warm and dry.  Neurological:     Mental Status: He is alert and oriented to person, place, and time.     BP 108/72 (BP Location: Left Arm, Patient Position: Sitting, Cuff Size: Normal)   Pulse 77   Temp 98.1 F (36.7 C) (Oral)   Resp 18   Wt 197 lb (89.4 kg)   SpO2 98%   BMI 30.85 kg/m  Wt  Readings from Last 3 Encounters:  03/25/18 197 lb (89.4 kg)  02/23/18 197 lb 3.2 oz (89.4 kg)  01/28/18 197 lb 12.8 oz (89.7 kg)  Lab Results  Component Value Date   WBC 7.4 03/25/2018   HGB 15.7 03/25/2018   HCT 45.5 03/25/2018   PLT 292.0 03/25/2018   GLUCOSE 101 (H) 02/23/2018   CHOL 178 10/27/2017   TRIG 170.0 (H) 10/27/2017   HDL 37.30 (L) 10/27/2017   LDLDIRECT 100.0 02/19/2017   LDLCALC 107 (H) 10/27/2017   ALT 19 02/23/2018   AST 15 02/23/2018   NA 138 02/23/2018   K 4.0 02/23/2018   CL 100 02/23/2018   CREATININE 1.16 02/23/2018   BUN 12 02/23/2018   CO2 31 02/23/2018   TSH 0.78 10/27/2017   PSA 0.52 01/24/2014   HGBA1C 5.2 10/27/2017    Lab Results  Component Value Date   TSH 0.78 10/27/2017   Lab Results  Component Value Date   WBC 7.4 03/25/2018   HGB 15.7 03/25/2018   HCT 45.5 03/25/2018   MCV 87.5 03/25/2018   PLT 292.0 03/25/2018   Lab Results  Component Value Date   NA 138 02/23/2018   K 4.0 02/23/2018   CO2 31 02/23/2018   GLUCOSE 101 (H) 02/23/2018   BUN 12 02/23/2018   CREATININE 1.16 02/23/2018   BILITOT 0.6 02/23/2018   ALKPHOS 71 02/23/2018   AST 15 02/23/2018   ALT 19 02/23/2018   PROT 7.0 02/23/2018   ALBUMIN 4.6 02/23/2018   CALCIUM 9.6 02/23/2018   ANIONGAP 7 06/06/2014   GFR 74.94 02/23/2018   Lab Results  Component Value Date   CHOL 178 10/27/2017   Lab Results  Component Value Date   HDL 37.30 (L) 10/27/2017   Lab Results  Component Value Date   LDLCALC 107 (H) 10/27/2017   Lab Results  Component Value Date   TRIG 170.0 (H) 10/27/2017   Lab Results  Component Value Date   CHOLHDL 5 10/27/2017   Lab Results  Component Value Date   HGBA1C 5.2 10/27/2017       Assessment & Plan:   Problem List Items Addressed This Visit    Hyperglycemia    hgba1c acceptable, minimize simple carbs. Increase exercise as tolerated. Continue current meds      Lymphadenitis    CT neck due to the recurrent nature of  the swelling LN, CT shows no concerning pathology. Will try to retreat with antibiotics, Clindamycin and Medrol dosepak simultaneously.        Other Visit Diagnoses    Lymphadenopathy    -  Primary   Relevant Orders   CBC w/Diff (Completed)      I have discontinued Barrie A. Becht's cephALEXin. I am also having him start on clindamycin. Additionally, I am having him maintain his tiZANidine, trimethoprim-polymyxin b, and methylPREDNISolone.  Meds ordered this encounter  Medications  . methylPREDNISolone (MEDROL) 4 MG tablet    Sig: 5 tab po qd X 1d then 4 tab po qd X 1d then 3 tab po qd X 1d then 2 tab po qd then 1 tab po qd    Dispense:  15 tablet    Refill:  0  . clindamycin (CLEOCIN) 300 MG capsule    Sig: Take 1 capsule (300 mg total) by mouth 3 (three) times daily.    Dispense:  30 capsule    Refill:  0     Danise Edge, MD

## 2018-03-25 NOTE — Assessment & Plan Note (Signed)
hgba1c acceptable, minimize simple carbs. Increase exercise as tolerated. Continue current meds 

## 2018-03-26 MED FILL — METHYLPREDNISOLONE 4 MG TAB: 4 | 5 days supply | Qty: 15 | Fill #0

## 2018-03-26 MED FILL — tiZANidine HCL 4 MG TABS: 4 | 20 days supply | Qty: 60 | Fill #1

## 2018-03-26 MED FILL — CLINDAMYCIN HCL 300 MG CAPS: 300 | 10 days supply | Qty: 30 | Fill #0

## 2018-04-02 ENCOUNTER — Encounter: Payer: Self-pay | Admitting: Family Medicine

## 2018-04-02 MED FILL — ACETAMINOPHEN/COD #3 TABLET: 300-30 | 4 days supply | Qty: 16 | Fill #0

## 2018-04-06 NOTE — Telephone Encounter (Signed)
Patient is requesting the same letter but stating he can work "up to 8 hours". Patient is requesting a call back to discuss further, if needed. Please advise.

## 2018-04-08 NOTE — Telephone Encounter (Signed)
Sent new letter to patient mail and my chart

## 2018-04-09 ENCOUNTER — Telehealth: Payer: Self-pay

## 2018-04-09 NOTE — Telephone Encounter (Signed)
Copied from CRM [id number]. Topic: General - Other >> Apr 08, 2018  4:21 PM Marylen Ponto wrote: Reason for CRM: Pt stated he has a medical form regarding his restrictions that he needs Dr. Abner Greenspan to complete. Pt stated he will stop by the office to drop off the form. Pt asked that Dr. Abner Greenspan be made aware that the form has to be completed within 14 days.    Awaiting om paperwork

## 2018-04-16 ENCOUNTER — Telehealth: Payer: Self-pay | Admitting: Family Medicine

## 2018-04-16 NOTE — Telephone Encounter (Signed)
Patient is calling because he will be bringing the paper work to [place] this afternoon.  His whole family got sick as to why he did not bring it sooner. Please advise. Thank you.

## 2018-04-16 NOTE — Telephone Encounter (Signed)
Pt dropped off documents for medical restrictions per Dr. B, documents placed in tray at front office

## 2018-04-22 NOTE — Telephone Encounter (Signed)
Received [employer] Medical Information and Restriction Assessment paperwork from [employer], completed as much as possible; forwarded to provider with attached patient note/SLS 02/13

## 2018-04-23 DIAGNOSIS — Z0279 Encounter for issue of other medical certificate: Secondary | ICD-10-CM

## 2018-04-23 NOTE — Telephone Encounter (Signed)
Forms almost completed with missing information needed by the patient. Called the patient left message for patient to call the office back to help fill the rest of his paper work out.

## 2018-05-06 ENCOUNTER — Ambulatory Visit: Payer: No Typology Code available for payment source | Admitting: Family Medicine

## 2018-05-21 MED FILL — tiZANidine HCL 4 MG TABS: 4 | 20 days supply | Qty: 60 | Fill #2

## 2018-06-03 ENCOUNTER — Ambulatory Visit: Payer: No Typology Code available for payment source | Admitting: Family Medicine

## 2018-06-14 ENCOUNTER — Ambulatory Visit: Payer: Self-pay | Admitting: Family Medicine

## 2018-06-14 ENCOUNTER — Other Ambulatory Visit: Payer: Self-pay | Admitting: Family Medicine

## 2018-06-14 ENCOUNTER — Ambulatory Visit (INDEPENDENT_AMBULATORY_CARE_PROVIDER_SITE_OTHER): Payer: No Typology Code available for payment source | Admitting: Family Medicine

## 2018-06-14 ENCOUNTER — Other Ambulatory Visit: Payer: Self-pay

## 2018-06-14 DIAGNOSIS — Z7189 Other specified counseling: Secondary | ICD-10-CM | POA: Diagnosis not present

## 2018-06-14 DIAGNOSIS — T7840XD Allergy, unspecified, subsequent encounter: Secondary | ICD-10-CM | POA: Diagnosis not present

## 2018-06-14 DIAGNOSIS — T7840XA Allergy, unspecified, initial encounter: Secondary | ICD-10-CM | POA: Insufficient documentation

## 2018-06-14 MED ORDER — FLUTICASONE PROPIONATE 50 MCG/ACT NA SUSP: 2.0000 | Freq: Every day | NASAL | 6 refills | Status: DC

## 2018-06-14 MED ORDER — SALINE SPRAY 0.65 % NA SOLN: 1.0000 | NASAL | 5 refills | Status: DC | PRN

## 2018-06-14 MED ORDER — CETIRIZINE HCL 10 MG PO TABS: 10.0000 mg | ORAL_TABLET | Freq: Every day | ORAL | 11 refills | Status: DC

## 2018-06-14 MED ORDER — AMOXICILLIN 500 MG PO CAPS: 500.0000 mg | ORAL_CAPSULE | Freq: Three times a day (TID) | ORAL | 0 refills | Status: DC

## 2018-06-14 NOTE — Assessment & Plan Note (Addendum)
Was out in yard over the weekend, consider Zyrtec bid prn, Flonase prn. Notes some mild sore throat on the left started today. If worsens with HA, fevers, nausea is given a round of Amoxicillin to take.

## 2018-06-14 NOTE — Telephone Encounter (Signed)
See if he can do a Doxy visit

## 2018-06-14 NOTE — Telephone Encounter (Signed)
Pt called c/o allergy symptoms from mowing the yard and putting out mulch over the weekend. He is requesting a Z Pak.   "That usually gets rid of it".   He woke up with a sore throat this morning.   His nose is stuffy.  No travels , no fever, no shortness of breath.    Due to the coronavirus pandemic the doctors are doing video chat tphone call visits.   He can be reached at his work number (confirmed and is correct in chart).   He will be out of the office 10:15-11:15 other in.   If you get his voicemail it will say it's for Collie Siad, she's retired but you have the correct voicemail.   You can leave a message and I will get it.  He was agreeable to a phone call visit because he has bad reception otherwise. I let him know the office would be contacting him.  I sent these notes to Dr. Mariel Aloe office for further disposition.    Reason for Disposition . [1] Nasal allergies AND [2] only certain times of year AND [3] hay fever diagnosis has never been confirmed by a HCP  Answer Assessment - Initial Assessment Questions 1. SYMPTOM: "What's the main symptom you're concerned about?" (e.g., runny nose, stuffiness, sneezing, itching)  After being outside I can tell the allergies are bothering me.   I'm taking Zarby.   I have a sore throat and no fever.   I mowed the yard and doing mulch.   2. SEVERITY: "How bad is it?" "What does it keep you from doing?" (e.g., sleeping, working)      Nasal stuffiness and sore throat.    The Z Pak knocks it out.   Dr. Abner Greenspan gave me something like Mucinex a few months ago.    I've tried Claritin and OTC antihistamines they don't seem to work. 3. EYES: "Are the eyes also red, watery, and itchy?"      My eyes not bothering me. 4. TRIGGER: "What pollen or other allergic substance do you think is causing the symptoms?"      Pollen from working outside. 5. TREATMENT: "What medicine are you using?" "What medicine worked best in the past?"     See above. 6. OTHER  SYMPTOMS: "Do you have any other symptoms?" (e.g., coughing, difficulty breathing, wheezing)     No travels, no fever or shortness of breath.   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 7. PREGNANCY: "Is there any chance you are pregnant?" "When was your last menstrual period?"     N/A  Protocols used: NASAL ALLERGIES (HAY FEVER)-A-AH

## 2018-06-14 NOTE — Telephone Encounter (Signed)
Dr Abner Greenspan -- please advise type of visit to address pt's concerns?

## 2018-06-14 NOTE — Progress Notes (Signed)
Virtual Visit via Telephone Note  I connected with Jared Myers on 06/14/18 at 10:00 AM EDT by telephone and verified that I am speaking with the correct person using two identifiers.   I discussed the limitations, risks, security and privacy concerns of performing an evaluation and management service by telephone and the availability of in person appointments. I also discussed with the patient that there may be a patient responsible charge related to this service. The patient expressed understanding and agreed to proceed. Attempted to get patient on video visit but he is at work at the [employer] and is unable to get video access so telephone visit is completed.    History of Present Illness:Patient worked out in his yard all weekend and came in with congestion, PND and pruritus. Has been taking OTC nutriceutials called Zarbees and he felt they were helping but then this am he developed a sore throat on the left. Denies CP/palp/SOB/HA/fevers/GI or GU c/o. Taking meds as prescribed    Observations/Objective:visit via telephone   Assessment and Plan:  Allergies Was out in yard over the weekend, consider Zyrtec bid prn, Flonase prn. Notes some mild sore throat on the left started today. If worsens with HA, fevers, nausea is given a round of Amoxicillin to take.   Educated About Covid-19 Virus Infection Patient works for the [employer] and is advised regarding hand washing social distancing, universal masking and symptoms to watch out for.     Follow Up Instructions: prn    I discussed the assessment and treatment plan with the patient. The patient was provided an opportunity to ask questions and all were answered. The patient agreed with the plan and demonstrated an understanding of the instructions.   The patient was advised to call back or seek an in-person evaluation if the symptoms worsen or if the condition fails to improve as anticipated.  I provided 15 minutes of  non-face-to-face time during this encounter.   Danise Edge, MD

## 2018-06-14 NOTE — Assessment & Plan Note (Signed)
Patient works for the [employer] and is advised regarding hand washing social distancing, universal masking and symptoms to watch out for.

## 2018-06-15 NOTE — Telephone Encounter (Signed)
Patient did virtual visit

## 2018-06-30 ENCOUNTER — Telehealth: Payer: Self-pay

## 2018-06-30 NOTE — Telephone Encounter (Signed)
Copied from CRM [id number]. Topic: General - Other >> Jun 30, 2018  9:38 AM Wyonia Hough E wrote: Reason for CRM: Hanna with [facility] called and stated that there was insufficient clinical information for the Pt's CT scan order and they need results of the clinical information. Office can submit missing information for reconsideration and call [phone] ref# [id number]

## 2018-07-01 NOTE — Telephone Encounter (Signed)
Gwen, Please advise

## 2018-07-05 NOTE — Telephone Encounter (Signed)
The PA for the CT Scan was denied due to not receiving the clinical notes. The fax that came through was all cover sheets. Pt is wanting an appeal to be started or a reconsideration. The provider needs to call to get options on this case to move further.  And reference case number [id number] / please advise

## 2018-07-06 ENCOUNTER — Telehealth: Payer: Self-pay | Admitting: Family Medicine

## 2018-07-06 NOTE — Telephone Encounter (Signed)
Called and spoke to [place] at [facility] on 07/05/2018 who advised the claim for DOS 03/25/2018 was some how submitted to the wrong address.  E-mail sent to [employer] to send to:  [employer] Payer ID [phone] [facility] [address] [address] [place], [place] [address]  Called and LVM to notify patient of this update.

## 2018-07-06 NOTE — Telephone Encounter (Signed)
Please advise 

## 2018-07-09 NOTE — Telephone Encounter (Signed)
Called left message to call the office back  Nurse triage may handle  Patient needs appt next week virtually

## 2018-07-09 NOTE — Telephone Encounter (Signed)
lled pt back and gave pt update. Pt made appt for 07/15/18. Please call pt and cancel if appt is no longer  Required.

## 2018-07-09 NOTE — Telephone Encounter (Signed)
Think to restart this process we need a virtual visit to help get this approved so I can document current state and how it has gottne better or worse. Please arrange for next week.

## 2018-07-09 NOTE — Telephone Encounter (Signed)
Made pt OV appt for 07/15/18 at 3:40 pm. NT does not make virtual appts but made an OV. Please change to virtual.

## 2018-07-12 ENCOUNTER — Encounter: Payer: Self-pay | Admitting: Family Medicine

## 2018-07-12 NOTE — Telephone Encounter (Signed)
Spoke with pt and discussed need for virtual visit using Mychart video visit option. Pt voices understanding. States he will attempt to do mychart video visit but internet reception at and around his work is poor.

## 2018-07-13 NOTE — Telephone Encounter (Signed)
Carollee Herter, I seen in his chart where you helped him with this situation before. Looks like he is still having the same issue, Please advise

## 2018-07-14 ENCOUNTER — Ambulatory Visit: Payer: Self-pay | Admitting: *Deleted

## 2018-07-14 NOTE — Telephone Encounter (Signed)
Pt reports sore throat, saw Dr. Abner Greenspan 06/14/2018 for simlar symptoms. States sore throat remains, sinus symptoms resolved. States noted yellow patch left side of throat, small "Like a popcorn kernel"  Throat painful, 4-5/10. States has picked up antibiotic from pharmacy but has not taken yet.  Also has not been taking zyrtec routinely. Afebrile per pt report, no other symptoms. Assured pt [place] would alert Dr. Abner Greenspan to symptoms. Advised to start antibiotics and continue Zyrtec. After hours call. Please advise: [phone]  Answer Assessment - Initial Assessment Questions 1. ONSET: "When did the throat start hurting?" (Hours or days ago)     weeks 2. SEVERITY: "How bad is the sore throat?" (Scale 1-10; mild, moderate or severe)   - MILD (1-3):  doesn't interfere with eating or normal activities   - MODERATE (4-7): interferes with eating some solids and normal activities   - SEVERE (8-10):  excruciating pain, interferes with most normal activities   - SEVERE DYSPHAGIA: can't swallow liquids, drooling     4-5/10 3. STREP EXPOSURE: "Has there been any exposure to strep within the past week?" If so, ask: "What type of contact occurred?"      no 4.  VIRAL SYMPTOMS: "Are there any symptoms of a cold, such as a runny nose, cough, hoarse voice or red eyes?"      no 5. FEVER: "Do you have a fever?" If so, ask: "What is your temperature, how was it measured, and when did it start?"     no 6. PUS ON THE TONSILS: "Is there pus on the tonsils in the back of your throat?"     "Yellow spot, like blister in left side of throat, small" 7. OTHER SYMPTOMS: "Do you have any other symptoms?" (e.g., difficulty breathing, headache, rash)     no  Protocols used: SORE THROAT-A-AH

## 2018-07-14 NOTE — Telephone Encounter (Signed)
  Reason for Disposition . [1] Sore throat is the only symptom AND [2] present > 48 hours  Protocols used: SORE THROAT-A-AH

## 2018-07-15 ENCOUNTER — Ambulatory Visit: Payer: No Typology Code available for payment source | Admitting: Family Medicine

## 2018-07-15 ENCOUNTER — Other Ambulatory Visit: Payer: Self-pay

## 2018-07-15 NOTE — Telephone Encounter (Signed)
Patient has appointment scheduled with Dr. Abner Greenspan on today.

## 2018-08-12 ENCOUNTER — Ambulatory Visit (INDEPENDENT_AMBULATORY_CARE_PROVIDER_SITE_OTHER): Payer: No Typology Code available for payment source | Admitting: Family Medicine

## 2018-08-12 ENCOUNTER — Other Ambulatory Visit: Payer: Self-pay

## 2018-08-12 DIAGNOSIS — M6289 Other specified disorders of muscle: Secondary | ICD-10-CM

## 2018-08-12 DIAGNOSIS — E663 Overweight: Secondary | ICD-10-CM | POA: Diagnosis not present

## 2018-08-12 DIAGNOSIS — R739 Hyperglycemia, unspecified: Secondary | ICD-10-CM | POA: Diagnosis not present

## 2018-08-12 MED ORDER — TIZANIDINE HCL 4 MG PO TABS: 4.0000 mg | ORAL_TABLET | Freq: Three times a day (TID) | ORAL | 3 refills | Status: DC | PRN

## 2018-08-12 MED FILL — tiZANidine HCL 4 MG TABS: 4 | 20 days supply | Qty: 60 | Fill #0

## 2018-08-12 NOTE — Patient Instructions (Signed)

## 2018-08-12 NOTE — Assessment & Plan Note (Signed)
Encouraged DASH diet, decrease po intake and increase exercise as tolerated. Needs 7-8 hours of sleep nightly. Avoid trans fats, eat small, frequent meals every 4-5 hours with lean proteins, complex carbs and healthy fats. Minimize simple carbs 

## 2018-08-12 NOTE — Progress Notes (Signed)
Virtual Visit via Telephone Note  I connected with Jared FlakeJoshua A Myers on 08/12/18 at  3:40 PM EDT by a phone enabled telemedicine application and verified that I am speaking with the correct person using two identifiers.  Location: Patient: work Provider: office   I discussed the limitations of evaluation and management by telemedicine and the availability of in person appointments. The patient expressed understanding and agreed to proceed. Crissie SicklesPrincess Carter, CMA attempted to get the patient set up with a video visit but was unsuccessful so we completed visit with telephone visit    Subjective:    Patient ID: Jared Myers, male    DOB: 29-Jun-1980, 38 y.o.   MRN: [id number]  No chief complaint on file.   HPI Patient is in today for follow up on chronic medical concerns including pelvic floor dysfunction, hyperglycemia and more. He notes he has been having pelvic floor, hip pain he feels due to increased work stress at the [employer]. Denies CP/palp/SOB/HA/congestion/fevers/GI or GU c/o. Taking meds as prescribed. He is expecting the birth of his second child.   Past Medical History:  Diagnosis Date  . 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 Constipation 12/18/2013  . Hyperglycemia 09/28/2014  . Hyperlipidemia, mixed 06/22/2014  . Infectious mononucleosis 07/18/2013  . Inguinal hernia 05/12/2012  . Low back pain 12/16/2013  . Overweight 06/22/2014  . Pelvic floor dysfunction   . Premature ventricular beat   . Splenomegaly 07/18/2013  . Tremulousness 08/14/2013    No past surgical history on file.  Family History  Problem Relation Age of Onset  . Cancer Mother        Non Hodkins lymphoma, breast cancer  . Other Father        pituatary adenoma  . Cancer Paternal Uncle        recurrent prostate   . Heart disease Maternal Grandmother        chf  . Heart disease Maternal Grandfather        chf  . Heart disease Paternal Grandfather        chf  . Depression Brother   . Anemia  Sister     Social History   Socioeconomic History  . Marital status: Divorced    Spouse name: Not on file  . Number of children: Not on file  . Years of education: Not on file  . Highest education level: Not on file  Occupational History  . Not on file  Social Needs  . Financial resource strain: Not on file  . Food insecurity:    Worry: Not on file    Inability: Not on file  . Transportation needs:    Medical: Not on file    Non-medical: Not on file  Tobacco Use  . Smoking status: Never Smoker  . Smokeless tobacco: Never Used  Substance and Sexual Activity  . Alcohol use: No  . Drug use: No  . 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 Physical activity:    Days per week: Not on file    Minutes per session: Not on file  . Stress: Not on file  Relationships  . Social connections:    Talks on phone: Not on file    Gets together: Not on file    Attends religious service: Not on file    Active member of club or organization: Not on file    Attends  meetings of clubs or organizations: Not on file    Relationship status: Not on file  . Intimate partner violence:    Fear of current or ex partner: Not on file    Emotionally abused: Not on file    Physically abused: Not on file    Forced sexual activity: Not on file  Other Topics Concern  . Not on file  Social History Narrative   New baby on the way 07/15/16   Wife is 13 weeks    Outpatient Medications Prior to Visit  Medication Sig Dispense Refill  . cetirizine (ZYRTEC) 10 MG tablet Take 1 tablet (10 mg total) by mouth daily. 30 tablet 11  . fluticasone (FLONASE) 50 MCG/ACT nasal spray Place 2 sprays into both nostrils daily. 16 g 6  . sodium chloride (OCEAN) 0.65 % SOLN nasal spray Place 1 spray into both nostrils as needed for congestion. 88 mL 5  . amoxicillin (AMOXIL) 500 MG capsule Take 1 capsule (500 mg total) by mouth 3 (three)  times daily. 30 capsule 0  . tiZANidine (ZANAFLEX) 4 MG tablet Take 1 tablet (4 mg total) by mouth every 8 (eight) hours as needed for muscle spasms. 60 tablet 3   No facility-administered medications prior to visit.     Allergies  Allergen Reactions  . Guaifenesin     REACTION: PVCs  . Prednisone     PVC    Review of Systems  Constitutional: Negative for fever and malaise/fatigue.  HENT: Negative for congestion.   Eyes: Negative for blurred vision.  Respiratory: Negative for shortness of breath.   Cardiovascular: Negative for chest pain, palpitations and leg swelling.  Gastrointestinal: Negative for abdominal pain, blood in stool and nausea.  Genitourinary: Negative for dysuria and frequency.  Musculoskeletal: Positive for joint pain and myalgias. Negative for falls.  Skin: Negative for rash.  Neurological: Negative for dizziness, loss of consciousness and headaches.  Endo/Heme/Allergies: Negative for environmental allergies.  Psychiatric/Behavioral: Negative for depression. The patient is not nervous/anxious.        Objective:    Physical Exam unable to obtain via telephone  Wt 193 lb (87.5 kg)   BMI 30.23 kg/m  Wt Readings from Last 3 Encounters:  08/12/18 193 lb (87.5 kg)  03/25/18 197 lb (89.4 kg)  02/23/18 197 lb 3.2 oz (89.4 kg)    Diabetic Foot Exam - Simple   No data filed     Lab Results  Component Value Date   WBC 7.4 03/25/2018   HGB 15.7 03/25/2018   HCT 45.5 03/25/2018   PLT 292.0 03/25/2018   GLUCOSE 101 (H) 02/23/2018   CHOL 178 10/27/2017   TRIG 170.0 (H) 10/27/2017   HDL 37.30 (L) 10/27/2017   LDLDIRECT 100.0 02/19/2017   LDLCALC 107 (H) 10/27/2017   ALT 19 02/23/2018   AST 15 02/23/2018   NA 138 02/23/2018   K 4.0 02/23/2018   CL 100 02/23/2018   CREATININE 1.16 02/23/2018   BUN 12 02/23/2018   CO2 31 02/23/2018   TSH 0.78 10/27/2017   PSA 0.52 01/24/2014   HGBA1C 5.2 10/27/2017    Lab Results  Component Value Date   TSH  0.78 10/27/2017   Lab Results  Component Value Date   WBC 7.4 03/25/2018   HGB 15.7 03/25/2018   HCT 45.5 03/25/2018   MCV 87.5 03/25/2018   PLT 292.0 03/25/2018   Lab Results  Component Value Date   NA 138 02/23/2018   K 4.0 02/23/2018  CO2 31 02/23/2018   GLUCOSE 101 (H) 02/23/2018   BUN 12 02/23/2018   CREATININE 1.16 02/23/2018   BILITOT 0.6 02/23/2018   ALKPHOS 71 02/23/2018   AST 15 02/23/2018   ALT 19 02/23/2018   PROT 7.0 02/23/2018   ALBUMIN 4.6 02/23/2018   CALCIUM 9.6 02/23/2018   ANIONGAP 7 06/06/2014   GFR 74.94 02/23/2018   Lab Results  Component Value Date   CHOL 178 10/27/2017   Lab Results  Component Value Date   HDL 37.30 (L) 10/27/2017   Lab Results  Component Value Date   LDLCALC 107 (H) 10/27/2017   Lab Results  Component Value Date   TRIG 170.0 (H) 10/27/2017   Lab Results  Component Value Date   CHOLHDL 5 10/27/2017   Lab Results  Component Value Date   HGBA1C 5.2 10/27/2017       Assessment & Plan:   Problem List Items Addressed This Visit    Pelvic floor dysfunction    Notes increased stress at his job has increased the pelvic floor pain some. Tizanidine 4 mg qhs does help he will let us know if this workens.       Overweight    Encouraged DASH diet, decrease po intake and increase exercise as tolerated. Needs 7-8 hours of sleep nightly. Avoid trans fats, eat small, frequent meals every 4-5 hours with lean proteins, complex carbs and healthy fats. Minimize simple carbs      Hyperglycemia    hgba1c acceptable, minimize simple carbs. Increase exercise as tolerated.          I have discontinued Ivin Booty A. Fjelstad's amoxicillin. I am also having him maintain his cetirizine, fluticasone, sodium chloride, and tiZANidine.  Meds ordered this encounter  Medications  . tiZANidine (ZANAFLEX) 4 MG tablet    Sig: Take 1 tablet (4 mg total) by mouth every 8 (eight) hours as needed for muscle spasms.    Dispense:  60 tablet     Refill:  3    I discussed the assessment and treatment plan with the patient. The patient was provided an opportunity to ask questions and all were answered. The patient agreed with the plan and demonstrated an understanding of the instructions.   The patient was advised to call back or seek an in-person evaluation if the symptoms worsen or if the condition fails to improve as anticipated.  I provided 15 minutes of non-face-to-face time during this encounter.   Danise Edge, MD

## 2018-08-12 NOTE — Assessment & Plan Note (Signed)
Notes increased stress at his job has increased the pelvic floor pain some. Tizanidine 4 mg qhs does help he will let us know if this workens.

## 2018-08-12 NOTE — Assessment & Plan Note (Signed)
hgba1c acceptable, minimize simple carbs. Increase exercise as tolerated.  

## 2018-08-26 ENCOUNTER — Telehealth: Payer: Self-pay | Admitting: *Deleted

## 2018-08-26 NOTE — Telephone Encounter (Signed)
Left message on work # to return my call. Left detailed message on cell# that he will need a Virtual Visit with a Provider in the office as his PCP is out of the office all week and to call us to schedule an appointment.  Copied from [place] [id number]. Topic: General - Other >> Aug 26, 2018  9:33 AM Leward Quan A wrote: Reason for CRM: Patient called to say that the glands in his throat is starting to swell up again th what is the next step. Please call patient at Ph# [phone]

## 2018-09-15 ENCOUNTER — Ambulatory Visit (INDEPENDENT_AMBULATORY_CARE_PROVIDER_SITE_OTHER): Payer: No Typology Code available for payment source | Admitting: Family Medicine

## 2018-09-15 ENCOUNTER — Other Ambulatory Visit: Payer: Self-pay

## 2018-09-15 DIAGNOSIS — J029 Acute pharyngitis, unspecified: Secondary | ICD-10-CM | POA: Diagnosis not present

## 2018-09-15 MED ORDER — AZITHROMYCIN 250 MG PO TABS: ORAL_TABLET | ORAL | 1 refills | Status: DC

## 2018-09-15 NOTE — Progress Notes (Signed)
Virtual Visit via phone Note  I connected with Jared FlakeJoshua A Menz on 09/15/18 at  9:20 AM EDT by a phone enabled telemedicine application and verified that I am speaking with the correct person using two identifiers.  Location: Patient: work Provider: home   I discussed the limitations of evaluation and management by telemedicine and the availability of in person appointments. The patient expressed understanding and agreed to proceed. Princess Montez MoritaCarter CMA was able to get patient set up on phone visit as he was unable to do a video visit    Subjective:    Patient ID: Jared Myers, male    DOB: March 15, 1980, 38 y.o.   MRN: [id number]  No chief complaint on file.   HPI Patient is in today for evaluation of sore throat.  Has become recurrent and symptoms of malaise started earlier in the week and then over past 24 hours he endorses sore throat with exudate and swelling, malaise, headache and nausea. Denies CP/palp/SOB/congestion or GU c/o. Taking meds as prescribed  Past Medical History:  Diagnosis Date  . 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 Constipation 12/18/2013  . Hyperglycemia 09/28/2014  . Hyperlipidemia, mixed 06/22/2014  . Infectious mononucleosis 07/18/2013  . Inguinal hernia 05/12/2012  . Low back pain 12/16/2013  . Overweight 06/22/2014  . Pelvic floor dysfunction   . Premature ventricular beat   . Splenomegaly 07/18/2013  . Tremulousness 08/14/2013    No past surgical history on file.  Family History  Problem Relation Age of Onset  . Cancer Mother        Non Hodkins lymphoma, breast cancer  . Other Father        pituatary adenoma  . Cancer Paternal Uncle        recurrent prostate   . Heart disease Maternal Grandmother        chf  . Heart disease Maternal Grandfather        chf  . Heart disease Paternal Grandfather        chf  . Depression Brother   . Anemia Sister     Social History   Socioeconomic History  . Marital status: Divorced    Spouse name:  Not on file  . Number of children: Not on file  . Years of education: Not on file  . Highest education level: Not on file  Occupational History  . Not on file  Social Needs  . Financial resource strain: Not on file  . Food insecurity    Worry: Not on file    Inability: Not on file  . Transportation needs    Medical: Not on file    Non-medical: Not on file  Tobacco Use  . Smoking status: Never Smoker  . Smokeless tobacco: Never Used  Substance and Sexual Activity  . Alcohol use: No  . Drug use: No  . 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 Physical activity    Days per week: Not on file    Minutes per session: Not on file  . Stress: Not on file  Relationships  . Social Musicianconnections    Talks on phone: Not on file    Gets together: Not on file    Attends religious service: Not on file    Active member of club or organization: Not on file    Attends meetings of clubs or organizations: Not on file  Relationship status: Not on file  . Intimate partner violence    Fear of current or ex partner: Not on file    Emotionally abused: Not on file    Physically abused: Not on file    Forced sexual activity: Not on file  Other Topics Concern  . Not on file  Social History Narrative   New baby on the way 07/15/16   Wife is 13 weeks    Outpatient Medications Prior to Visit  Medication Sig Dispense Refill  . cetirizine (ZYRTEC) 10 MG tablet Take 1 tablet (10 mg total) by mouth daily. 30 tablet 11  . fluticasone (FLONASE) 50 MCG/ACT nasal spray Place 2 sprays into both nostrils daily. 16 g 6  . sodium chloride (OCEAN) 0.65 % SOLN nasal spray Place 1 spray into both nostrils as needed for congestion. 88 mL 5  . tiZANidine (ZANAFLEX) 4 MG tablet Take 1 tablet (4 mg total) by mouth every 8 (eight) hours as needed for muscle spasms. 60 tablet 3   No facility-administered medications prior to visit.      Allergies  Allergen Reactions  . Guaifenesin     REACTION: PVCs  . Prednisone     PVC    Review of Systems  Constitutional: Positive for fever and malaise/fatigue.  HENT: Positive for sore throat. Negative for congestion.   Eyes: Negative for blurred vision.  Respiratory: Negative for shortness of breath.   Cardiovascular: Negative for chest pain, palpitations and leg swelling.  Gastrointestinal: Positive for nausea. Negative for abdominal pain, blood in stool and vomiting.  Genitourinary: Negative for dysuria and frequency.  Musculoskeletal: Negative for falls.  Skin: Negative for rash.  Neurological: Positive for headaches. Negative for dizziness and loss of consciousness.  Endo/Heme/Allergies: Negative for environmental allergies.  Psychiatric/Behavioral: Negative for depression. The patient is not nervous/anxious.        Objective:    Physical Exam unable to obtain via phone  There were no vitals taken for this visit. Wt Readings from Last 3 Encounters:  08/12/18 193 lb (87.5 kg)  03/25/18 197 lb (89.4 kg)  02/23/18 197 lb 3.2 oz (89.4 kg)    Diabetic Foot Exam - Simple   No data filed     Lab Results  Component Value Date   WBC 7.4 03/25/2018   HGB 15.7 03/25/2018   HCT 45.5 03/25/2018   PLT 292.0 03/25/2018   GLUCOSE 101 (H) 02/23/2018   CHOL 178 10/27/2017   TRIG 170.0 (H) 10/27/2017   HDL 37.30 (L) 10/27/2017   LDLDIRECT 100.0 02/19/2017   LDLCALC 107 (H) 10/27/2017   ALT 19 02/23/2018   AST 15 02/23/2018   NA 138 02/23/2018   K 4.0 02/23/2018   CL 100 02/23/2018   CREATININE 1.16 02/23/2018   BUN 12 02/23/2018   CO2 31 02/23/2018   TSH 0.78 10/27/2017   PSA 0.52 01/24/2014   HGBA1C 5.2 10/27/2017    Lab Results  Component Value Date   TSH 0.78 10/27/2017   Lab Results  Component Value Date   WBC 7.4 03/25/2018   HGB 15.7 03/25/2018   HCT 45.5 03/25/2018   MCV 87.5 03/25/2018   PLT 292.0 03/25/2018   Lab Results  Component  Value Date   NA 138 02/23/2018   K 4.0 02/23/2018   CO2 31 02/23/2018   GLUCOSE 101 (H) 02/23/2018   BUN 12 02/23/2018   CREATININE 1.16 02/23/2018   BILITOT 0.6 02/23/2018   ALKPHOS 71 02/23/2018   AST 15  02/23/2018   ALT 19 02/23/2018   PROT 7.0 02/23/2018   ALBUMIN 4.6 02/23/2018   CALCIUM 9.6 02/23/2018   ANIONGAP 7 06/06/2014   GFR 74.94 02/23/2018   Lab Results  Component Value Date   CHOL 178 10/27/2017   Lab Results  Component Value Date   HDL 37.30 (L) 10/27/2017   Lab Results  Component Value Date   LDLCALC 107 (H) 10/27/2017   Lab Results  Component Value Date   TRIG 170.0 (H) 10/27/2017   Lab Results  Component Value Date   CHOLHDL 5 10/27/2017   Lab Results  Component Value Date   HGBA1C 5.2 10/27/2017       Assessment & Plan:   Problem List Items Addressed This Visit    Pharyngitis    Has become recurrent and symptoms of malaise started earlier in the week and then over past 24 hours he endorses sore throat with exudate and swelling, malaise, headache and nausea. Will start on Zpak but if no improvement or continued recurrence may need consultation with ENT         I am having Jared FlakeJoshua A. Wisman start on azithromycin. I am also having him maintain his cetirizine, fluticasone, sodium chloride, and tiZANidine.  Meds ordered this encounter  Medications  . azithromycin (ZITHROMAX) 250 MG tablet    Sig: 2 tabs po once then 1 tab po daily x 4 days    Dispense:  6 tablet    Refill:  1    I discussed the assessment and treatment plan with the patient. The patient was provided an opportunity to ask questions and all were answered. The patient agreed with the plan and demonstrated an understanding of the instructions.   The patient was advised to call back or seek an in-person evaluation if the symptoms worsen or if the condition fails to improve as anticipated.  I provided 15 minutes of non-face-to-face time during this encounter.   Danise EdgeStacey  Layaan Mott, MD

## 2018-09-15 NOTE — Assessment & Plan Note (Signed)
Has become recurrent and symptoms of malaise started earlier in the week and then over past 24 hours he endorses sore throat with exudate and swelling, malaise, headache and nausea. Will start on Zpak but if no improvement or continued recurrence may need consultation with ENT

## 2018-12-10 ENCOUNTER — Ambulatory Visit: Payer: Self-pay | Admitting: *Deleted

## 2018-12-10 NOTE — Telephone Encounter (Signed)
  Pt called in c/o urinary frequency, burning and pressure under his navel area over his bladder.   Yesterday he had very severe lower back pain.    See triage notes.  I called into Dr. Frederik Pear office and spoke with [facility].   There are only 2 providers there today and they are booked. I referred pt to the urgent care/ED.   He thought about going to the [facility] ED but he would prefer to go to an urgent care rather than the ED if possible.   He decided to possibly go to the [facility] urgent care in [place], [place] that's the next closest one to him.   I instructed him to be seen today due to the burning and frequency and going into a weekend.    He was agreeable to this plan.   Reason for Disposition . Side (flank) or lower back pain present  Answer Assessment - Initial Assessment Questions 1. SEVERITY: "How bad is the pain?"  (e.g., Scale 1-10; mild, moderate, or severe)   - MILD (1-3): complains slightly about urination hurting   - MODERATE (4-7): interferes with normal activities     - SEVERE (8-10): excruciating, unwilling or unable to urinate because of the pain      I'm burning when I urinate.   I have pelvic floor dysfunction.   I'm going frequently like 12 times in a 8 hr shift.     I'm having diarrhea like mucus. 2 months ago I have hemorrhoids  2. FREQUENCY: "How many times have you had painful urination today?"      Many times today.   3. PATTERN: "Is pain present every time you urinate or just sometimes?"      I had back pain yesterday on both sides of my back.    Today I'm having pressure below my navel.    No kidney stone history.    4. ONSET: "When did the painful urination start?"      Over last day or so off and on.   No blood in urine but a little in my stool.    5. FEVER: "Do you have a fever?" If so, ask: "What is your temperature, how was it measured, and when did it start?"     No    I had chills and nausea yesterday but no fever, I checked it. 6. PAST UTI:  "Have you had a urine infection before?" If so, ask: "When was the last time?" and "What happened that time?"      Yes in the past with the pelvic floor dysfunction.   7. CAUSE: "What do you think is causing the painful urination?"      UTI or kidney stones. 8. OTHER SYMPTOMS: "Do you have any other symptoms?" (e.g., flank pain, penile discharge, scrotal pain, blood in urine)     No blood  Protocols used: URINATION PAIN - MALE-A-AH

## 2019-01-25 ENCOUNTER — Encounter: Payer: Self-pay | Admitting: Medical

## 2019-01-25 ENCOUNTER — Other Ambulatory Visit: Payer: Self-pay

## 2019-01-25 ENCOUNTER — Ambulatory Visit (INDEPENDENT_AMBULATORY_CARE_PROVIDER_SITE_OTHER): Payer: No Typology Code available for payment source | Admitting: Medical

## 2019-01-25 VITALS — Temp 99.3°F

## 2019-01-25 DIAGNOSIS — K624 Stenosis of anus and rectum: Secondary | ICD-10-CM

## 2019-01-25 DIAGNOSIS — J3489 Other specified disorders of nose and nasal sinuses: Secondary | ICD-10-CM | POA: Diagnosis not present

## 2019-01-25 DIAGNOSIS — R062 Wheezing: Secondary | ICD-10-CM | POA: Diagnosis not present

## 2019-01-25 DIAGNOSIS — J029 Acute pharyngitis, unspecified: Secondary | ICD-10-CM

## 2019-01-25 DIAGNOSIS — R05 Cough: Secondary | ICD-10-CM | POA: Diagnosis not present

## 2019-01-25 DIAGNOSIS — R059 Cough, unspecified: Secondary | ICD-10-CM

## 2019-01-25 MED ORDER — BENZONATATE 100 MG PO CAPS: 100.0000 mg | ORAL_CAPSULE | Freq: Three times a day (TID) | ORAL | 0 refills | Status: DC | PRN

## 2019-01-25 MED ORDER — AZITHROMYCIN 250 MG PO TABS: ORAL_TABLET | ORAL | 1 refills | Status: DC

## 2019-01-25 MED ORDER — ALBUTEROL SULFATE HFA 108 (90 BASE) MCG/ACT IN AERS: 2.0000 | INHALATION_SPRAY | Freq: Four times a day (QID) | RESPIRATORY_TRACT | 0 refills | Status: DC | PRN

## 2019-01-25 NOTE — Progress Notes (Signed)
Subjective:    Patient ID: Jared Myers, male    DOB: November 18, 1980, 38 y.o.   MRN: [id number]  HPI  Virtual Visit via Telephone Note  I connected with [name] on 01/25/19 at  8:40 AM EST by telephone and verified that I am speaking with the correct person using two identifiers.  Location: Patient: work(post office) Provider: office   I discussed the limitations, risks, security and privacy concerns of performing an evaluation and management service by telephone and the availability of in person appointments. I also discussed with the patient that there may be a patient responsible charge related to this service. The patient expressed understanding and agreed to proceed.   History of Present Illness:  Pt states his wife and daughter both have stuffy nose recently. His wife is pregnant. He goes to work and does all shopping. He states last wed night started to get tingle in throat and then started to cough. Pt states he has some allergy history and has some zyrtec and flonase at home. He started this yesterday. He states now feels chest congestion and if clears throat will see colored mucus. At home his temp was 99.3.  If he presses over sinus he has some pressure/pain. He has tried some sinus rapid relief. Pt does wear mask on dock but was not wearing all the time now that he is very congested. Also now when he blows his nose will get colored mucus.     Observations/Objective: General- no acute distress. Pleasant, normal speech. heent- when he over frontal and ethmoid sinus area has some pain.   Assessment and Plan: You have recent sinus pressure, sore throat, minimal possible wheeze and productive cough at times.  I do think it is a good idea to go ahead and give you a azithromycin antibiotic, benzonatate cough tablets and have you continue with your Flonase.  If you have any obvious wheezing making albuterol inhaler available.  I sent my chart work note.  You need to stay  off of work/quarantine until we get Covid test results and discuss how you are doing with above treatment.   Follow-up date to be determined but we will communicate through my chart when you are test results come back.  Please contact me sooner if you have worsening or changing symptoms.   Esperanza Richters, PA-C Follow Up Instructions:    I discussed the assessment and treatment plan with the patient. The patient was provided an opportunity to ask questions and all were answered. The patient agreed with the plan and demonstrated an understanding of the instructions.   The patient was advised to call back or seek an in-person evaluation if the symptoms worsen or if the condition fails to improve as anticipated.  I provided 25 minutes of non-face-to-face time during this encounter.  50% of time spent counseling patient on plan going forward and answering patient's questions.   Esperanza Richters, PA-C    Review of Systems  Constitutional: Negative for chills, fatigue and fever.  HENT: Positive for congestion, sinus pressure and sinus pain. Negative for ear pain, postnasal drip, sneezing and sore throat.   Respiratory: Positive for cough and wheezing. Negative for shortness of breath.        Maybe slight wheeze at best.  Cardiovascular: Negative for chest pain and palpitations.  Gastrointestinal: Negative for abdominal pain.  Musculoskeletal: Negative for back pain and myalgias.  Skin: Negative for rash.  Neurological: Negative for dizziness, syncope, numbness and headaches.  Hematological:  Negative for adenopathy.  Psychiatric/Behavioral: Negative for behavioral problems and confusion.    Past Medical History:  Diagnosis Date  . 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 Constipation 12/18/2013  . Hyperglycemia 09/28/2014  . Hyperlipidemia, mixed 06/22/2014  . Infectious mononucleosis 07/18/2013  . Inguinal hernia 05/12/2012  . Low back pain 12/16/2013  . Overweight 06/22/2014  .  Pelvic floor dysfunction   . Premature ventricular beat   . Splenomegaly 07/18/2013  . Tremulousness 08/14/2013     Social History   Socioeconomic History  . Marital status: Divorced    Spouse name: Not on file  . Number of children: Not on file  . Years of education: Not on file  . Highest education level: Not on file  Occupational History  . Not on file  Social Needs  . Financial resource strain: Not on file  . Food insecurity    Worry: Not on file    Inability: Not on file  . Transportation needs    Medical: Not on file    Non-medical: Not on file  Tobacco Use  . Smoking status: Never Smoker  . Smokeless tobacco: Never Used  Substance and Sexual Activity  . Alcohol use: No  . Drug use: No  . 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 Physical activity    Days per week: Not on file    Minutes per session: Not on file  . Stress: Not on file  Relationships  . Social Herbalist on phone: Not on file    Gets together: Not on file    Attends religious service: Not on file    Active member of club or organization: Not on file    Attends meetings of clubs or organizations: Not on file    Relationship status: Not on file  . Intimate partner violence    Fear of current or ex partner: Not on file    Emotionally abused: Not on file    Physically abused: Not on file    Forced sexual activity: Not on file  Other Topics Concern  . Not on file  Social History Narrative   New baby on the way 07/15/16   Wife is 13 weeks    No past surgical history on file.  Family History  Problem Relation Age of Onset  . Cancer Mother        Non Hodkins lymphoma, breast cancer  . Other Father        pituatary adenoma  . Cancer Paternal Uncle        recurrent prostate   . Heart disease Maternal Grandmother        chf  . Heart disease Maternal Grandfather        chf  . Heart disease  Paternal Grandfather        chf  . Depression Brother   . Anemia Sister     Allergies  Allergen Reactions  . Guaifenesin     REACTION: PVCs  . Prednisone     PVC    Current Outpatient Medications on File Prior to Visit  Medication Sig Dispense Refill  . azithromycin (ZITHROMAX) 250 MG tablet 2 tabs po once then 1 tab po daily x 4 days 6 tablet 1  . cetirizine (ZYRTEC) 10 MG tablet Take 1 tablet (10 mg total) by mouth daily. 30 tablet 11  . fluticasone (FLONASE) 50  MCG/ACT nasal spray Place 2 sprays into both nostrils daily. 16 g 6  . sodium chloride (OCEAN) 0.65 % SOLN nasal spray Place 1 spray into both nostrils as needed for congestion. 88 mL 5  . tiZANidine (ZANAFLEX) 4 MG tablet Take 1 tablet (4 mg total) by mouth every 8 (eight) hours as needed for muscle spasms. 60 tablet 3   No current facility-administered medications on file prior to visit.     There were no vitals taken for this visit.      Objective:   Physical Exam        Assessment & Plan:

## 2019-01-25 NOTE — Patient Instructions (Addendum)
You have recent sinus pressure, sore throat, minimal possible wheeze and productive cough at times.  I do think it is a good idea to go ahead and give you a azithromycin antibiotic, benzonatate cough tablets and have you continue with your Flonase.  If you have any obvious wheezing making albuterol inhaler available.  I sent my chart work note.  You need to stay off of work/quarantine until we get Covid test results and discuss how you are doing with above treatment.   Follow-up date to be determined but we will communicate through my chart when you are test results come back.  Please contact me sooner if you have worsening or changing symptoms.

## 2019-01-26 ENCOUNTER — Encounter: Payer: Self-pay | Admitting: Medical

## 2019-01-26 LAB — NOVEL CORONAVIRUS, NAA: SARS-CoV-2, NAA: NOT DETECTED

## 2019-01-27 ENCOUNTER — Telehealth: Payer: Self-pay | Admitting: Medical

## 2019-01-27 ENCOUNTER — Encounter: Payer: Self-pay | Admitting: Medical

## 2019-01-27 ENCOUNTER — Telehealth: Payer: Self-pay | Admitting: Family Medicine

## 2019-01-27 DIAGNOSIS — R05 Cough: Secondary | ICD-10-CM

## 2019-01-27 DIAGNOSIS — R059 Cough, unspecified: Secondary | ICD-10-CM

## 2019-01-27 MED ORDER — HYDROCODONE-HOMATROPINE 5-1.5 MG/5ML PO SYRP: 5.0000 mL | ORAL_SOLUTION | Freq: Four times a day (QID) | ORAL | 0 refills | Status: DC | PRN

## 2019-01-27 NOTE — Telephone Encounter (Signed)
Please see note below. 

## 2019-01-27 NOTE — Telephone Encounter (Signed)
Rx hycodan sent to pt pharmacy. 

## 2019-01-27 NOTE — Telephone Encounter (Signed)
Pt doesn't feel well and his employer wants him to return to work but the Pt is still experiencing a bad cough and states he still feels like crap... his fever is still up and down. He can tell he is feeling better but not 100%. Pt spoke with Dr. Harvie Heck prior. / Pt would like to know if he should go back to work or stay home for a few more days/ Pt would like a call from Dr. Charlett Blake or Dr. Harvie Heck / please advise

## 2019-01-27 NOTE — Telephone Encounter (Signed)
Saw your note and his notes last night. Call him and make sure he saw the my chart response. Rx fof cough med and get cxr. Return to work date depends on how he feels Monday. Don't recommend return to work yet.

## 2019-01-27 NOTE — Telephone Encounter (Signed)
Spoke with patient he is still having bad cough.  Please see the two other mychart messages.  I advised him that you were only her for half day today.  I wrote note for him to go back on Tuesday (he is normally off Sunday and Monday).  He works at [employer].

## 2019-01-27 NOTE — Telephone Encounter (Signed)
Future chest xray placed. 

## 2019-01-28 ENCOUNTER — Other Ambulatory Visit: Payer: Self-pay

## 2019-01-28 ENCOUNTER — Ambulatory Visit (HOSPITAL_BASED_OUTPATIENT_CLINIC_OR_DEPARTMENT_OTHER)
Admission: RE | Admit: 2019-01-28 | Discharge: 2019-01-28 | Disposition: A | Discharge status: Home / Self Care | Payer: No Typology Code available for payment source | Source: Ambulatory Visit | Attending: Medical | Admitting: Medical

## 2019-01-28 DIAGNOSIS — R05 Cough: Secondary | ICD-10-CM | POA: Diagnosis present

## 2019-01-28 DIAGNOSIS — R059 Cough, unspecified: Secondary | ICD-10-CM

## 2019-01-28 MED FILL — HYDROCODONE-HOMATROPINE SYR: 5-1.5 | 5 days supply | Qty: 100 | Fill #0

## 2019-01-28 NOTE — Telephone Encounter (Signed)
Left detailed message on machine.  He did read [employer].

## 2019-01-29 ENCOUNTER — Encounter: Payer: Self-pay | Admitting: Medical

## 2019-02-24 ENCOUNTER — Other Ambulatory Visit: Payer: Self-pay | Admitting: Medical

## 2019-02-24 NOTE — Telephone Encounter (Signed)
Last OV 01/25/19 Last refill 01/25/19 # 18 g / 0 Next OV not scheduled

## 2019-03-08 ENCOUNTER — Ambulatory Visit: Payer: Self-pay | Admitting: *Deleted

## 2019-03-08 ENCOUNTER — Ambulatory Visit (INDEPENDENT_AMBULATORY_CARE_PROVIDER_SITE_OTHER): Payer: No Typology Code available for payment source | Admitting: Medical

## 2019-03-08 ENCOUNTER — Other Ambulatory Visit: Payer: Self-pay

## 2019-03-08 VITALS — Temp 98.7°F | Ht 67.0 in | Wt 189.0 lb

## 2019-03-08 DIAGNOSIS — Z20828 Contact with and (suspected) exposure to other viral communicable diseases: Secondary | ICD-10-CM

## 2019-03-08 DIAGNOSIS — Z20822 Contact with and (suspected) exposure to covid-19: Secondary | ICD-10-CM

## 2019-03-08 NOTE — Telephone Encounter (Signed)
Contacted pt due to his concerns about  d exposure to covid-19; he had visitors on 03/04/2019, that were exposed to [place], and are now exhibiting symptoms; atient would like to know if he and pregnant wife should be tested for COVID; they are not exhibiting symptoms; symptoms at this time; informed pt that there are community testing sites that do not require an MDs order; the pt says due to insure payment, he needs documentation from his PCP regarding the need for testing; also explained that he and his family should monitor for symptoms; he sees Dr Charlett Blake, [facility]; pt transferred to [facility] for scheduling.

## 2019-03-08 NOTE — Progress Notes (Signed)
Subjective:    Patient ID: Jared Myers, male    DOB: 1980-08-28, 38 y.o.   MRN: [id number]  HPI   Virtual Visit via Video Note  I connected with Jared Myers on 03/08/19 at  1:40 PM EST by a video enabled telemedicine application and verified that I am speaking with the correct person using two identifiers.  Location: Patient: car Provider: office   I discussed the limitations of evaluation and management by telemedicine and the availability of in person appointments. The patient expressed understanding and agreed to proceed.  History of Present Illness: Pt has appointment for evaluation. One person at [employer] had covid. Pt mom is sick and being tested for covid.    Observations/Objective: General- no acute distress, pleasant, alert, oriented and normal speech.  Assessment and Plan:   Follow Up Instructions:    I discussed the assessment and treatment plan with the patient. The patient was provided an opportunity to ask questions and all were answered. The patient agreed with the plan and demonstrated an understanding of the instructions.   The patient was advised to call back or seek an in-person evaluation if the symptoms worsen or if the condition fails to improve as anticipated.  I provided 25 minutes of non-face-to-face time during this encounter.   Mackie Pai, PA-C    Review of Systems  Constitutional: Negative for chills, fatigue and fever.  HENT: Negative for congestion, drooling, ear discharge, ear pain, facial swelling, mouth sores, nosebleeds, postnasal drip, sinus pressure, sinus pain and sore throat.   Respiratory: Negative for cough, chest tightness, shortness of breath and wheezing.   Cardiovascular: Negative for chest pain and palpitations.  Gastrointestinal: Negative for abdominal pain.  Musculoskeletal: Negative for back pain and myalgias.  Neurological: Negative for dizziness, speech difficulty, weakness, light-headedness and  headaches.  Psychiatric/Behavioral: Negative for behavioral problems and confusion.    Past Medical History:  Diagnosis Date  . 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 Constipation 12/18/2013  . Hyperglycemia 09/28/2014  . Hyperlipidemia, mixed 06/22/2014  . Infectious mononucleosis 07/18/2013  . Inguinal hernia 05/12/2012  . Low back pain 12/16/2013  . Overweight 06/22/2014  . Pelvic floor dysfunction   . Premature ventricular beat   . Splenomegaly 07/18/2013  . Tremulousness 08/14/2013     Social History   Socioeconomic History  . Marital status: Married    Spouse name: Not on file  . Number of children: Not on file  . Years of education: Not on file  . Highest education level: Not on file  Occupational History  . Not on file  Tobacco Use  . Smoking status: Never Smoker  . Smokeless tobacco: Never Used  Substance and Sexual Activity  . Alcohol use: No  . Drug use: No  . 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 Not on file  Social History Narrative   New baby on the way 07/15/16   Wife is 13 weeks   Social Determinants of Health   Financial Resource Strain:   . Difficulty of Paying Living Expenses: Not on file  Food Insecurity:   . Worried About Charity fundraiser in the Last Year: Not on file  . Ran Out of Food in the Last Year: Not on file  Transportation Needs:   . Lack of Transportation (Medical): Not on file  . Lack of Transportation (Non-Medical): Not  on file  Physical Activity:   . Days of Exercise per Week: Not on file  . Minutes of Exercise per Session: Not on file  Stress:   . Feeling of Stress : Not on file  Social Connections:   . Frequency of Communication with Friends and Family: Not on file  . Frequency of Social Gatherings with Friends and Family: Not on file  . Attends Religious Services: Not on file  . Active Member of Clubs or Organizations:  Not on file  . Attends Banker Meetings: Not on file  . Marital Status: Not on file  Intimate Partner Violence:   . Fear of Current or Ex-Partner: Not on file  . Emotionally Abused: Not on file  . Physically Abused: Not on file  . Sexually Abused: Not on file    No past surgical history on file.  Family History  Problem Relation Age of Onset  . Cancer Mother        Non Hodkins lymphoma, breast cancer  . Other Father        pituatary adenoma  . Cancer Paternal Uncle        recurrent prostate   . Heart disease Maternal Grandmother        chf  . Heart disease Maternal Grandfather        chf  . Heart disease Paternal Grandfather        chf  . Depression Brother   . Anemia Sister     Allergies  Allergen Reactions  . Guaifenesin     REACTION: PVCs  . Prednisone     PVC    Current Outpatient Medications on File Prior to Visit  Medication Sig Dispense Refill  . albuterol (VENTOLIN HFA) 108 (90 Base) MCG/ACT inhaler TAKE 2 PUFFS BY MOUTH EVERY 6 HOURS AS NEEDED FOR WHEEZE OR SHORTNESS OF BREATH 18 g 0  . cetirizine (ZYRTEC) 10 MG tablet Take 1 tablet (10 mg total) by mouth daily. 30 tablet 11  . fluticasone (FLONASE) 50 MCG/ACT nasal spray Place 2 sprays into both nostrils daily. 16 g 6  . sodium chloride (OCEAN) 0.65 % SOLN nasal spray Place 1 spray into both nostrils as needed for congestion. 88 mL 5  . tiZANidine (ZANAFLEX) 4 MG tablet Take 1 tablet (4 mg total) by mouth every 8 (eight) hours as needed for muscle spasms. 60 tablet 3   No current facility-administered medications on file prior to visit.    Temp 98.7 F (37.1 C) (Temporal)   Ht 5\' 7"  (1.702 m)   Wt 189 lb (85.7 kg)   BMI 29.60 kg/m       Objective:   Physical Exam        Assessment & Plan:  Glad to hear you have no signs and symptoms but with described exposure to person with covid best to get tested and stay off of work until results are back.  If you get any symptoms please  let me know.  Note/excuse sent to pt my chart. Return to work pending covid test result.  Follow up as needed  25 minutes spent with pt. 50% of time spent counseling pt on plan going forward and discussing testing and results etc in light of his wife upcoming induction/c-section.

## 2019-03-08 NOTE — Telephone Encounter (Signed)
  Reason for Disposition . General information question, no triage required and triager able to answer question  Answer Assessment - Initial Assessment Questions 1. REASON FOR CALL or QUESTION: "What is your reason for calling today?" or "How can I best help you?" or "What question do you have that I can help answer?"     COVID testing due to exposure  Protocols used: [place]

## 2019-03-08 NOTE — Patient Instructions (Signed)
Glad to hear you have no signs and symptoms but with described exposure to person with covid best to get tested and stay off of work until results are back.  If you get any symptoms please let me know.  Note/excuse sent to pt my chart. Return to work pending covid test result.  Follow up as needed

## 2019-03-09 ENCOUNTER — Encounter: Payer: Self-pay | Admitting: Medical

## 2019-03-10 ENCOUNTER — Encounter: Payer: Self-pay | Admitting: Medical

## 2019-03-10 ENCOUNTER — Telehealth: Payer: Self-pay | Admitting: Medical

## 2019-03-10 NOTE — Telephone Encounter (Signed)
Opened to review 

## 2019-07-22 ENCOUNTER — Telehealth (INDEPENDENT_AMBULATORY_CARE_PROVIDER_SITE_OTHER): Payer: No Typology Code available for payment source | Admitting: Medical

## 2019-07-22 ENCOUNTER — Other Ambulatory Visit: Payer: Self-pay

## 2019-07-22 VITALS — Temp 98.6°F | Wt 189.0 lb

## 2019-07-22 DIAGNOSIS — R109 Unspecified abdominal pain: Secondary | ICD-10-CM

## 2019-07-22 DIAGNOSIS — R110 Nausea: Secondary | ICD-10-CM

## 2019-07-22 DIAGNOSIS — K5900 Constipation, unspecified: Secondary | ICD-10-CM

## 2019-07-22 MED ORDER — FAMOTIDINE 20 MG PO TABS: 20.0000 mg | ORAL_TABLET | Freq: Every day | ORAL | 3 refills | Status: DC

## 2019-07-22 MED ORDER — ONDANSETRON 4 MG PO TBDP: 4.0000 mg | ORAL_TABLET | Freq: Three times a day (TID) | ORAL | 0 refills | Status: DC | PRN

## 2019-07-22 NOTE — Patient Instructions (Signed)
You have recent upset stomach, mild constipated sensation and nausea.  My impression on video visit is that you do not have acute severe abdomen condition presently.  However you some symptoms are now approaching 1 week in duration.  As we approached the weekend, I do want you to use 1 tablet of Dulcolax tonight in order to help clear potential mild constipation.  I prescribed famotidine to take once daily in the event of reflux type symptoms.  Also sent in prescription of Zofran for nausea or vomiting.  Recommended to eat bland healthy diet and stay well-hydrated.  I want you to call Monday morning and ask for appointment at 920 or 120.  Need to do physical exam and if still having discomfort work-up might include CBC, CMP, lipase and potential imaging studies.  If you have dramatic changing or worsening signs or symptoms over the weekend then recommend ED evaluation.

## 2019-07-22 NOTE — Progress Notes (Signed)
Subjective:    Patient ID: Jared Myers, male    DOB: [birth year], 39 y.o.   MRN: [id number]  HPI  Virtual Visit via Video Note  I connected with Jared Myers on 07/22/19 at  4:40 PM EDT by a video enabled telemedicine application and verified that I am speaking with the correct person using two identifiers.  Location: Patient: home Provider: office   I discussed the limitations of evaluation and management by telemedicine and the availability of in person appointments. The patient expressed understanding and agreed to proceed.   History of Present Illness:  Pt states just recently after mothers day he had some mild loose stools. In past he had gotten sick/loose stools after eating family friends food. Also some co-workers had gi illness.  Pt states he had loose stools for one day. Then Tuesday, wed and Thursday felt mild constipated but having formed stool. Only one abd surgery whole life for pyloric stenosis when younger.  Pt states he has waves of intermittent upset stomach and grumbling. Mid abdomen. No lower abdomen.   No alcohol recently and no antibiotic.   Observations/Objective:  General-no acute distress, pleasant, oriented. Lungs- on inspection lungs appear unlabored. Neck- no tracheal deviation or jvd on inspection. Neuro- gross motor function appears intact.  Assessment and Plan: You have recent upset stomach, mild constipated sensation and nausea.  My impression on video visit is that you do not have acute severe abdomen condition presently.  However you some symptoms are now approaching 1 week in duration.  As we approached the weekend, I do want you to use 1 tablet of Dulcolax tonight in order to help clear potential mild constipation.  I prescribed famotidine to take once daily in the event of reflux type symptoms.  Also sent in prescription of Zofran for nausea or vomiting.  Recommended to eat bland healthy diet and stay well-hydrated.  I want you to  call Monday morning and ask for appointment at 920 or 120.  Need to do physical exam and if still having discomfort work-up might include CBC, CMP, lipase and potential imaging studies.  If you have dramatic changing or worsening signs or symptoms over the weekend then recommend ED evaluation.    Follow Up Instructions:    I discussed the assessment and treatment plan with the patient. The patient was provided an opportunity to ask questions and all were answered. The patient agreed with the plan and demonstrated an understanding of the instructions.   The patient was advised to call back or seek an in-person evaluation if the symptoms worsen or if the condition fails to improve as anticipated.  Time spent with patient today was 40  minutes which consisted of chart review, discussing  differential diagnosis, explaining signs/symptms of differential dx, work up on monday, treatment this weekend, answering questions and documentation.   Esperanza Richters, PA-C    Review of Systems  Constitutional: Negative for chills, fatigue and fever.  Respiratory: Negative for choking, shortness of breath and wheezing.   Cardiovascular: Negative for chest pain and palpitations.  Gastrointestinal: Positive for abdominal pain and nausea.       See hpi.  Musculoskeletal: Negative for back pain.  Skin: Negative for rash.  Neurological: Negative for dizziness and headaches.  Hematological: Negative for adenopathy. Does not bruise/bleed easily.  Psychiatric/Behavioral: Negative for behavioral problems and decreased concentration.         Objective:   Physical Exam        Assessment &  Plan:

## 2019-07-25 ENCOUNTER — Encounter: Payer: Self-pay | Admitting: Medical

## 2019-07-26 ENCOUNTER — Other Ambulatory Visit: Payer: Self-pay

## 2019-07-26 ENCOUNTER — Ambulatory Visit: Payer: No Typology Code available for payment source | Admitting: Internal Medicine

## 2019-07-26 VITALS — BP 125/89 | HR 79 | Temp 97.4°F | Resp 18 | Ht 67.0 in | Wt 191.4 lb

## 2019-07-26 DIAGNOSIS — R1030 Lower abdominal pain, unspecified: Secondary | ICD-10-CM | POA: Diagnosis not present

## 2019-07-26 LAB — POC URINALSYSI DIPSTICK (AUTOMATED)
Bilirubin, UA: NEGATIVE
Blood, UA: NEGATIVE
Glucose, UA: NEGATIVE
Ketones, UA: NEGATIVE
Leukocytes, UA: NEGATIVE
Nitrite, UA: NEGATIVE
Protein, UA: NEGATIVE
Spec Grav, UA: 1.01 (ref 1.010–1.025)
Urobilinogen, UA: 0.2 E.U./dL
pH, UA: 7 (ref 5.0–8.0)

## 2019-07-26 LAB — COMPREHENSIVE METABOLIC PANEL
ALT: 21 U/L (ref 0–53)
AST: 19 U/L (ref 0–37)
Albumin: 4.7 g/dL (ref 3.5–5.2)
Alkaline Phosphatase: 65 U/L (ref 39–117)
BUN: 10 mg/dL (ref 6–23)
CO2: 29 mEq/L (ref 19–32)
Calcium: 9.3 mg/dL (ref 8.4–10.5)
Chloride: 102 mEq/L (ref 96–112)
Creatinine, Ser: 1.03 mg/dL (ref 0.40–1.50)
GFR: 80.27 mL/min (ref >60.00)
Glucose, Bld: 95 mg/dL (ref 70–99)
Potassium: 4.2 mEq/L (ref 3.5–5.1)
Sodium: 138 mEq/L (ref 135–145)
Total Bilirubin: 0.8 mg/dL (ref 0.2–1.2)
Total Protein: 7.3 g/dL (ref 6.0–8.3)

## 2019-07-26 LAB — CBC WITH DIFFERENTIAL/PLATELET
Basophils Absolute: 0 10*3/uL (ref 0.0–0.1)
Basophils Relative: 0.5 % (ref 0.0–3.0)
Eosinophils Absolute: 0 10*3/uL (ref 0.0–0.7)
Eosinophils Relative: 0.7 % (ref 0.0–5.0)
HCT: 44 % (ref 39.0–52.0)
Hemoglobin: 15.5 g/dL (ref 13.0–17.0)
Lymphocytes Relative: 29.4 % (ref 12.0–46.0)
Lymphs Abs: 1.7 10*3/uL (ref 0.7–4.0)
MCHC: 35.2 g/dL (ref 30.0–36.0)
MCV: 87.6 fl (ref 78.0–100.0)
Monocytes Absolute: 0.5 10*3/uL (ref 0.1–1.0)
Monocytes Relative: 9.3 % (ref 3.0–12.0)
Neutro Abs: 3.4 10*3/uL (ref 1.4–7.7)
Neutrophils Relative %: 60.1 % (ref 43.0–77.0)
Platelets: 267 10*3/uL (ref 150.0–400.0)
RBC: 5.02 Mil/uL (ref 4.22–5.81)
RDW: 13.4 % (ref 11.5–15.5)
WBC: 5.7 10*3/uL (ref 4.0–10.5)

## 2019-07-26 NOTE — Progress Notes (Signed)
Subjective:    Patient ID: Jared Myers, male    DOB: 1981-02-17, 39 y.o.   MRN: [id number]  DOS:  07/26/2019 Type of visit - description: acute   Symptoms started about 07/17/2019, he had on and off mild nausea and constipation. Was seen virtually 07/22/2019. Was prescribed Dulcolax for constipation &  Zofran for nausea. After a couple of doses of Dulcolax constipation resolved and he had a good bowel movement.  He was feeling very good   but last night he developed lower abdominal pain,  on the right side, pain is on and off. He also has developed some pain on the left side which is different (compared to the right) ; he feels L pain  is associated with pelvic floor dysfunction, a chronic issue for him..   Review of Systems Denies fever. Appetite is okay, no postprandial nausea or abdominal pain. No vomiting, no blood in the stools. No dysuria or gross hematuria.   Past Medical History:  Diagnosis Date  . 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 Constipation 12/18/2013  . Hyperglycemia 09/28/2014  . Hyperlipidemia, mixed 06/22/2014  . Infectious mononucleosis 07/18/2013  . Inguinal hernia 05/12/2012  . Low back pain 12/16/2013  . Overweight 06/22/2014  . Pelvic floor dysfunction   . Premature ventricular beat   . Splenomegaly 07/18/2013  . Tremulousness 08/14/2013    Past Surgical History:  Procedure Laterality Date  . NO PAST SURGERIES      Allergies as of 07/26/2019      Reactions   Guaifenesin    REACTION: PVCs   Prednisone    PVC      Medication List       Accurate as of Jul 26, 2019 10:33 AM. If you have any questions, ask your nurse or doctor.        albuterol 108 (90 Base) MCG/ACT inhaler Commonly known as: VENTOLIN HFA TAKE 2 PUFFS BY MOUTH EVERY 6 HOURS AS NEEDED FOR WHEEZE OR SHORTNESS OF BREATH   cetirizine 10 MG tablet Commonly known as: ZYRTEC Take 1 tablet (10 mg total) by mouth daily.   famotidine 20 MG tablet Commonly known as:  PEPCID Take 1 tablet (20 mg total) by mouth daily.   fluticasone 50 MCG/ACT nasal spray Commonly known as: FLONASE Place 2 sprays into both nostrils daily.   ondansetron 4 MG disintegrating tablet Commonly known as: Zofran ODT Take 1 tablet (4 mg total) by mouth every 8 (eight) hours as needed for nausea or vomiting.   sodium chloride 0.65 % Soln nasal spray Commonly known as: OCEAN Place 1 spray into both nostrils as needed for congestion.   tiZANidine 4 MG tablet Commonly known as: ZANAFLEX Take 1 tablet (4 mg total) by mouth every 8 (eight) hours as needed for muscle spasms.          Objective:   Physical Exam BP 125/89 (BP Location: Left Arm, Patient Position: Sitting, Cuff Size: Normal)   Pulse 79   Temp (!) 97.4 F (36.3 C) (Temporal)   Resp 18   Ht 5\' 7"  (1.702 m)   Wt 191 lb 6 oz (86.8 kg)   SpO2 99%   BMI 29.97 kg/m  General:   Well developed, NAD, BMI noted.  HEENT:  Normocephalic . Face symmetric, atraumatic.  Not pale or jaundiced Lungs:  CTA B Normal respiratory effort, no intercostal retractions, no accessory muscle use. Heart: RRR,  no murmur.  Abdomen:  Not distended, soft, not tender with superficial  palpation, slightly tender with deep palpation, L>R, no mass, no rebound.  Good bowel sounds. Skin: Not pale. Not jaundice Lower extremities: no pretibial edema bilaterally  Neurologic:  alert & oriented X3.  Speech normal, gait appropriate for age and unassisted Psych--  Cognition and judgment appear intact.  Cooperative with normal attention span and concentration.  Behavior appropriate. No anxious or depressed appearing.     Assessment      39 year old gentleman, with no previous surgeries, history of pelvic floor dysfunction presents with:  Lower abdominal pain: As described above, bilateral, no urinary symptoms. Exam is not completely benign (slightly tender) but does not suggest an acute abdomen. Udip neg  DDx includes pelvic floor  dysfunction, appendicitis (less likely) versus others. Plan: CMP, CBC, observation for the next couple of days, see AVS.  This visit occurred during the SARS-CoV-2 public health emergency.  Safety protocols were in place, including screening questions prior to the visit, additional usage of staff PPE, and extensive cleaning of exam room while observing appropriate contact time as indicated for disinfecting solutions.

## 2019-07-26 NOTE — Patient Instructions (Signed)
Rest and drink plenty fluids  Watch her symptoms for the next days.  Call or go to the ER if: Severe stomach pain, fever chills, nausea, vomiting, constipation, blood in the stools or if you are not improving in the next few days.   GO TO THE LAB : Get the blood work

## 2019-07-26 NOTE — Progress Notes (Signed)
Pre visit review using our clinic review tool, if applicable. No additional management support is needed unless otherwise documented below in the visit note. 

## 2019-07-27 ENCOUNTER — Ambulatory Visit: Payer: No Typology Code available for payment source | Admitting: Medical

## 2019-07-27 ENCOUNTER — Encounter: Payer: Self-pay | Admitting: Family Medicine

## 2019-09-06 ENCOUNTER — Encounter: Payer: Self-pay | Admitting: Family Medicine

## 2019-09-08 ENCOUNTER — Ambulatory Visit: Payer: No Typology Code available for payment source | Attending: Internal Medicine

## 2019-09-08 DIAGNOSIS — Z23 Encounter for immunization: Secondary | ICD-10-CM

## 2019-09-08 NOTE — Progress Notes (Signed)
° °  Covid-19 Vaccination Clinic  Name:  Jared Myers    MRN: [id number] DOB: 15-Aug-1980  09/08/2019  [name] was observed post Covid-19 immunization for 15 minutes without incident. He was provided with Vaccine Information Sheet and instruction to access the V-Safe system.   [name] was instructed to call 911 with any severe reactions post vaccine:  Difficulty breathing   Swelling of face and throat   A fast heartbeat   A bad rash all over body   Dizziness and weakness   Immunizations Administered    Name Date Dose VIS Date Route   Pfizer COVID-19 Vaccine 09/08/2019  3:29 PM 0.3 mL 05/04/2018 Intramuscular   Manufacturer: [employer], [employer]   Lot: [id number]   NDC: [address]-3

## 2019-09-22 ENCOUNTER — Other Ambulatory Visit: Payer: Self-pay

## 2019-09-22 ENCOUNTER — Ambulatory Visit (HOSPITAL_BASED_OUTPATIENT_CLINIC_OR_DEPARTMENT_OTHER)
Admission: RE | Admit: 2019-09-22 | Discharge: 2019-09-22 | Disposition: A | Discharge status: Home / Self Care | Payer: No Typology Code available for payment source | Source: Ambulatory Visit | Attending: Family Medicine | Admitting: Family Medicine

## 2019-09-22 ENCOUNTER — Encounter: Payer: Self-pay | Admitting: Family Medicine

## 2019-09-22 ENCOUNTER — Ambulatory Visit: Payer: No Typology Code available for payment source | Admitting: Family Medicine

## 2019-09-22 VITALS — BP 122/82 | HR 80 | Temp 98.2°F | Resp 16 | Ht 67.0 in | Wt 195.0 lb

## 2019-09-22 DIAGNOSIS — R079 Chest pain, unspecified: Secondary | ICD-10-CM

## 2019-09-22 DIAGNOSIS — R200 Anesthesia of skin: Secondary | ICD-10-CM

## 2019-09-22 LAB — COMPREHENSIVE METABOLIC PANEL
ALT: 23 U/L (ref 0–53)
AST: 18 U/L (ref 0–37)
Albumin: 4.6 g/dL (ref 3.5–5.2)
Alkaline Phosphatase: 65 U/L (ref 39–117)
BUN: 10 mg/dL (ref 6–23)
CO2: 30 mEq/L (ref 19–32)
Calcium: 9.5 mg/dL (ref 8.4–10.5)
Chloride: 102 mEq/L (ref 96–112)
Creatinine, Ser: 1.1 mg/dL (ref 0.40–1.50)
GFR: 74.35 mL/min (ref >60.00)
Glucose, Bld: 100 mg/dL — ABNORMAL HIGH (ref 70–99)
Potassium: 3.9 mEq/L (ref 3.5–5.1)
Sodium: 138 mEq/L (ref 135–145)
Total Bilirubin: 0.6 mg/dL (ref 0.2–1.2)
Total Protein: 7.3 g/dL (ref 6.0–8.3)

## 2019-09-22 LAB — CBC
HCT: 43.2 % (ref 39.0–52.0)
Hemoglobin: 15.2 g/dL (ref 13.0–17.0)
MCHC: 35.1 g/dL (ref 30.0–36.0)
MCV: 87.1 fl (ref 78.0–100.0)
Platelets: 264 10*3/uL (ref 150.0–400.0)
RBC: 4.95 Mil/uL (ref 4.22–5.81)
RDW: 13.1 % (ref 11.5–15.5)
WBC: 5.8 10*3/uL (ref 4.0–10.5)

## 2019-09-22 LAB — TSH: TSH: 0.97 u[IU]/mL (ref 0.35–4.50)

## 2019-09-22 LAB — HEMOGLOBIN A1C: Hgb A1c MFr Bld: 5.1 % (ref 4.6–6.5)

## 2019-09-22 LAB — TROPONIN I (HIGH SENSITIVITY): High Sens Troponin I: 3 ng/L (ref 2–17)

## 2019-09-22 NOTE — Progress Notes (Addendum)
[facility] [address], [address] [place], [place] [address] [phone]  Date:  09/22/2019   Name:  Jared Myers   DOB:  1980-07-19   MRN:  [id number]  PCP:  Bradd Canary, MD    Chief Complaint: Vaccine Reaction (started  two days, couldnt feel left side of face) and Chest Pain (did have chest pain earlier this week, headache, blurry vision-unsure if thats related. )   History of Present Illness:  Jared Myers is a 39 y.o. very pleasant male patient who presents with the following:  Patient of my partner Dr. Abner Greenspan here today with concern of possible vaccine reaction I have not seen this pt previously  History of hyperlipidemia, hyperglycemia, elevated blood pressure, anxiety  Over the last 1.5 days he noted a numbness in his lips.  This am he felt like his entire left face was numb.  It seemed to spread into the right cheek and his nose today as well No rash noted The numbness does not affect the rest of his body Yesterday he had an episode of diarrhea He has felt nauseated but no fever or vomiting 4 days ago he had an episode of chest pain that he thinks is due to stress.  It occurred when he was on a RIDING mower, he thinks this was due to worrying about his sister He does tend to work out on his treadmill on a regular basis and never gets any CP with exertion  He has not seen his eye doctor in a bit due to pandemic.  He is supposed to wear glasses to drive He describes noticing that one of the walls in his house seem to be "uneven" recently his wife do not see anything wrong with it  He also notes that his left eye is painful over the left lateral upper lid.  He feels like he is getting a stye No photophobia He had a headache the last couple of days- will respond to tylenol.  No HA today  He had a Pfizer covid vaccine on 7/1- he is not sure if this might be related Per notes, he was feeling anxious about having  this vaccine  No history of DM   Patient Active Problem List   Diagnosis Date Noted  . Allergies 06/14/2018  . Educated about COVID-19 virus infection 06/14/2018  . Lymphadenitis 02/25/2018  . Stye 12/06/2017  . Neck pain, acute 04/16/2017  . Left knee injury, initial encounter 02/28/2016  . Left ankle pain 01/21/2016  . Hyperglycemia 09/28/2014  . Hyperlipidemia, mixed 06/22/2014  . Overweight 06/22/2014  . Preventative health care 12/18/2013  . Constipation 12/18/2013  . Low back pain 12/16/2013  . Tremulousness 08/14/2013  . Splenomegaly 07/18/2013  . Sinusitis 05/22/2013  . Inguinal hernia 05/12/2012  . Pelvic floor dysfunction 04/28/2012  . Atypical chest pain 01/06/2011  . 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 Pharyngitis 05/13/2010  . PREMATURE VENTRICULAR CONTRACTIONS, FREQUENT 04/09/2010  . PROSTATITIS, CHRONIC 04/09/2010  . ELEVATED BLOOD PRESSURE WITHOUT DIAGNOSIS OF HYPERTENSION 04/09/2010    Past Medical History:  Diagnosis Date  . 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 Constipation 12/18/2013  . Hyperglycemia 09/28/2014  . Hyperlipidemia, mixed 06/22/2014  . Infectious mononucleosis 07/18/2013  . Inguinal hernia 05/12/2012  . Low back pain 12/16/2013  . Overweight 06/22/2014  . Pelvic floor dysfunction   . Premature ventricular beat   . Splenomegaly 07/18/2013  .  Tremulousness 08/14/2013    Past Surgical History:  Procedure Laterality Date  . NO PAST SURGERIES      Social History   Tobacco Use  . Smoking status: Never Smoker  . Smokeless tobacco: Never Used  Substance Use Topics  . Alcohol use: No  . Drug use: No    Family History  Problem Relation Age of Onset  . Cancer Mother        Non Hodkins lymphoma, breast cancer  . Other Father        pituatary adenoma  . Cancer Paternal Uncle        recurrent prostate   . Heart disease Maternal Grandmother        chf  . Heart disease Maternal Grandfather        chf  . Heart  disease Paternal Grandfather        chf  . Depression Brother   . Anemia Sister     Allergies  Allergen Reactions  . Guaifenesin     REACTION: PVCs  . Prednisone     PVC    Medication list has been reviewed and updated.  Current Outpatient Medications on File Prior to Visit  Medication Sig Dispense Refill  . tiZANidine (ZANAFLEX) 4 MG tablet Take 1 tablet (4 mg total) by mouth every 8 (eight) hours as needed for muscle spasms. 60 tablet 3   No current facility-administered medications on file prior to visit.    Review of Systems:  As per HPI- otherwise negative.   Physical Examination: Vitals:   09/22/19 1048  BP: 122/82  Pulse: 80  Resp: 16  Temp: 98.2 F (36.8 C)  SpO2: 96%   Vitals:   09/22/19 1048  Weight: 195 lb (88.5 kg)  Height: 5\' 7"  (1.702 m)   Body mass index is 30.54 kg/m. Ideal Body Weight: Weight in (lb) to have BMI = 25: 159.3  GEN: no acute distress.  Overweight, looks well  HEENT: Atraumatic, Normocephalic.   Bilateral TM wnl, oropharynx normal.  PEERL,EOMI.   Ears and Nose: No external deformity. CV: RRR, No M/G/R. No JVD. No thrill. No extra heart sounds. PULM: CTA B, no wheezes, crackles, rhonchi. No retractions. No resp. distress. No accessory muscle use. ABD: S, NT, ND, +BS. No rebound. No HSM. EXTR: No c/c/e PSYCH: Normally interactive. Conversant.  Normal strength and sensation of all limbs, normal DTR Normal romberg Normal facial motion and strength  Pt notes a feeling of decreased sensation, puffiness and "like Novocain" around both lips, into the bilateral nose and bilateral cheeks.  He feels like this is more pronounced on the left but is present bilaterally  There is no visible swelling or evidence of angioedema  EKG NSR with rate of 68 C/w tracing from 9/17-new EKG appears more normal Assessment and Plan: Facial numbness - Plan: EKG 12-Lead, Hemoglobin A1c, TSH, CBC, Comprehensive metabolic panel, CT Head Wo  Contrast  Chest pain, unspecified type - Plan: EKG 12-Lead, Troponin I (High Sensitivity)  Pt here today with concern of a pattern of facial numbness that does not fit any particular diagnosis which I can ascertain He also noted an episode of CP earlier this week, now resolved  Also numbness and puffy feeling of his face.  No actual angioedema is noted Discussed in detail with pt.  I cannot think of a particular nerve disorder that would cause this particular pattern.  Will obtain CT of his head to rule out any bleed or mass  We discussed  having him seen in the emergency room, the patient declines at this time  Signed Abbe Amsterdam, MD  Received his head CT and labs as below, gave patient a call He is at the grocery store, notes he feels about the same.  Advised that his work-up so far has been reassuring, we discussed her next steps.  He will let us know if any is getting worse, otherwise he will observe his symptoms and let me or his PCP know if not resolved in 4 to 5 days.  He wonders about getting his second vaccine; I advised him that certainly it is up to his discretion.  What I might suggest is delaying his second shot until we are sure the symptoms resolve He states understanding and agreement  CT Head Wo Contrast  Result Date: 09/22/2019 CLINICAL DATA:  Numbness on both sides of face for 2 days. EXAM: CT HEAD WITHOUT CONTRAST TECHNIQUE: Contiguous axial images were obtained from the base of the skull through the vertex without intravenous contrast. COMPARISON:  None. FINDINGS: Brain: The ventricles are normal in size and configuration. No extra-axial fluid collections are identified. The gray-white differentiation is maintained. No CT findings for acute hemispheric infarction or intracranial hemorrhage. No mass lesions. The brainstem and cerebellum are normal. Vascular: No hyperdense vessels or obvious aneurysm. Skull: No acute skull fracture. No bone lesion. Sinuses/Orbits: The  paranasal sinuses and mastoid air cells are clear. The globes are intact. Other: No scalp lesions, laceration or hematoma. IMPRESSION: Normal head CT. Electronically Signed   By: Rudie Meyer M.D.   On: 09/22/2019 12:24   Results for orders placed or performed in visit on 09/22/19  Hemoglobin A1c  Result Value Ref Range   Hgb A1c MFr Bld 5.1 4.6 - 6.5 %  TSH  Result Value Ref Range   TSH 0.97 0.35 - 4.50 uIU/mL  CBC  Result Value Ref Range   WBC 5.8 4.0 - 10.5 K/uL   RBC 4.95 4.22 - 5.81 Mil/uL   Platelets 264.0 150 - 400 K/uL   Hemoglobin 15.2 13.0 - 17.0 g/dL   HCT 79.8 39 - 52 %   MCV 87.1 78.0 - 100.0 fl   MCHC 35.1 30.0 - 36.0 g/dL   RDW 92.1 19.4 - 17.4 %  Comprehensive metabolic panel  Result Value Ref Range   Sodium 138 135 - 145 mEq/L   Potassium 3.9 3.5 - 5.1 mEq/L   Chloride 102 96 - 112 mEq/L   CO2 30 19 - 32 mEq/L   Glucose, Bld 100 (H) 70 - 99 mg/dL   BUN 10 6 - 23 mg/dL   Creatinine, Ser 0.81 0.40 - 1.50 mg/dL   Total Bilirubin 0.6 0.2 - 1.2 mg/dL   Alkaline Phosphatase 65 39 - 117 U/L   AST 18 0 - 37 U/L   ALT 23 0 - 53 U/L   Total Protein 7.3 6.0 - 8.3 g/dL   Albumin 4.6 3.5 - 5.2 g/dL   GFR 44.81 >85.63 mL/min   Calcium 9.5 8.4 - 10.5 mg/dL  Troponin I (High Sensitivity)  Result Value Ref Range   High Sens Troponin I 3 2 - 17 ng/L

## 2019-09-22 NOTE — Addendum Note (Signed)
Addended by: Harley Alto on: 09/22/2019 12:01 PM   Modules accepted: Orders

## 2019-09-24 ENCOUNTER — Encounter: Payer: Self-pay | Admitting: Family Medicine

## 2019-09-29 ENCOUNTER — Ambulatory Visit: Payer: No Typology Code available for payment source | Attending: Internal Medicine

## 2019-09-29 DIAGNOSIS — Z23 Encounter for immunization: Secondary | ICD-10-CM

## 2019-09-29 NOTE — Progress Notes (Signed)
   Covid-19 Vaccination Clinic  Name:  Jared Myers    MRN: [id number] DOB: 12-20-1980  09/29/2019  [name] was observed post Covid-19 immunization for 30 minutes based on pre-vaccination screening without incident. He was provided with Vaccine Information Sheet and instruction to access the V-Safe system.   [name] was instructed to call 911 with any severe reactions post vaccine: Marland Kitchen Difficulty breathing  . Swelling of face and throat  . A fast heartbeat  . A bad rash all over body  . Dizziness and weakness   Immunizations Administered    Name Date Dose VIS Date Route   Pfizer COVID-19 Vaccine 09/29/2019  3:24 PM 0.3 mL 05/04/2018 Intramuscular   Manufacturer: [employer], [employer]   Lot: [id number]   NDC: [address]-7

## 2019-11-04 ENCOUNTER — Other Ambulatory Visit: Payer: No Typology Code available for payment source

## 2019-11-04 ENCOUNTER — Other Ambulatory Visit: Payer: Self-pay | Admitting: Sleep Medicine

## 2019-11-04 DIAGNOSIS — I471 Supraventricular tachycardia: Secondary | ICD-10-CM

## 2019-11-06 LAB — SARS-COV-2, NAA 2 DAY TAT

## 2019-11-06 LAB — NOVEL CORONAVIRUS, NAA: SARS-CoV-2, NAA: NOT DETECTED

## 2019-11-07 ENCOUNTER — Encounter: Payer: Self-pay | Admitting: Family Medicine

## 2019-11-07 ENCOUNTER — Other Ambulatory Visit: Payer: Self-pay | Admitting: Family Medicine

## 2019-11-07 ENCOUNTER — Telehealth (INDEPENDENT_AMBULATORY_CARE_PROVIDER_SITE_OTHER): Payer: No Typology Code available for payment source | Admitting: Family Medicine

## 2019-11-07 ENCOUNTER — Other Ambulatory Visit: Payer: Self-pay

## 2019-11-07 VITALS — Temp 97.2°F | Ht 67.0 in | Wt 183.2 lb

## 2019-11-07 DIAGNOSIS — J029 Acute pharyngitis, unspecified: Secondary | ICD-10-CM | POA: Diagnosis not present

## 2019-11-07 MED ORDER — PREDNISONE 20 MG PO TABS: 40.0000 mg | ORAL_TABLET | Freq: Every day | ORAL | 0 refills | Status: AC

## 2019-11-07 MED ORDER — AMOXICILLIN 500 MG PO CAPS: 1000.0000 mg | ORAL_CAPSULE | Freq: Every day | ORAL | 0 refills | Status: AC

## 2019-11-07 MED FILL — AMOXICILLIN 500 MG CAPSULE: 500 | 10 days supply | Qty: 20 | Fill #0

## 2019-11-07 NOTE — Progress Notes (Signed)
Chief Complaint  Patient presents with  . Sore Throat    covid exposure. test results were negative  . Nasal Congestion    NEEDS WORK NOTE TODAY    Jared Myers here for URI complaints.   Duration: several days  Associated symptoms: sinus congestion, swollen throat, neck pain, rhinorrhea and sore throat Denies: sinus pain, itchy watery eyes, ear pain, ear drainage, wheezing, shortness of breath, myalgia and fevers Sick contacts: Yes; exposed to covid 8-9 d ago, tested 3 d ago and neg; he is fully vaccinated  Past Medical History:  Diagnosis Date  . 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 Constipation 12/18/2013  . Hyperglycemia 09/28/2014  . Hyperlipidemia, mixed 06/22/2014  . Infectious mononucleosis 07/18/2013  . Inguinal hernia 05/12/2012  . Low back pain 12/16/2013  . Overweight 06/22/2014  . Pelvic floor dysfunction   . Premature ventricular beat   . Splenomegaly 07/18/2013  . Tremulousness 08/14/2013    Temp (!) 97.2 F (36.2 C) (Temporal)   Ht 5\' 7"  (1.702 m)   Wt 183 lb 4 oz (83.1 kg)   BMI 28.70 kg/m  No conversational dyspnea Age appropriate judgment and insight Nml affect and mood  Sore throat - Plan: predniSONE (DELTASONE) 20 MG tablet  Patient reports that he gets swelling in his throat when he has pain.  Prednisone to help alleviate the swelling.  If he has difficulty breathing should seek immediate care.  If no improvement or if he starts developing patches in his throat, will call in amoxicillin.  He does have tender lymph nodes when he pushes on the left side of his neck.  2/4 Centor criteria. Letter for work given stating he can return on Thursday or sooner if he is feeling better. F/u prn. Pt voiced understanding and agreement to the plan.  Saturday Sims, DO 11/07/19 11:44 AM

## 2019-11-11 ENCOUNTER — Telehealth: Payer: Self-pay | Admitting: Family Medicine

## 2019-11-11 MED ORDER — ONDANSETRON 4 MG PO TBDP: 4.0000 mg | ORAL_TABLET | Freq: Three times a day (TID) | ORAL | 0 refills | Status: DC | PRN

## 2019-11-11 NOTE — Telephone Encounter (Signed)
Called informed of PCP instructions. 

## 2019-11-11 NOTE — Telephone Encounter (Signed)
Nurse Assessment Nurse: Stefano Gaul, RN, Dwana Curd Date/Time (Eastern Time): 11/11/2019 8:02:07 AM Confirm and document reason for call. If symptomatic, describe symptoms. ---Caller states he tested negative for COVID last week. had phone appt on Monday and was diagnosed with strep. has been taking amoxicillin and prednisone. He is nauseated. he is unable to eat. He is only able to eat popsicles. he has diarrhea. has urinated. throat is much better. temp 97.5 Has the patient had close contact with a person known or suspected to have the novel coronavirus illness OR traveled / lives in area with major community spread (including international travel) in the last 14 days from the onset of symptoms? * If Asymptomatic, screen for exposure and travel within the last 14 days. ---No Does the patient have any new or worsening symptoms? ---Yes Will a triage be completed? ---Yes Related visit to physician within the last 2 weeks? ---Yes Does the PT have any chronic conditions? (i.e. diabetes, asthma, this includes High risk factors for pregnancy, etc.) ---Yes List chronic conditions. ---history of PVCs Is this a behavioral health or substance abuse call? ---No Guidelines Guideline Title Affirmed Question Affirmed Notes Nurse Date/Time (Eastern Time) Nausea Taking prescription medication that could Stefano Gaul, Charity fundraiser, Dwana Curd 11/11/2019 8:06:37 AM PLEASE NOTE: All timestamps contained within this report are represented as [place] Standard Time. CONFIDENTIALTY NOTICE: This fax transmission is intended only for the addressee. It contains information that is legally privileged, confidential or otherwise protected from use or disclosure. If you are not the intended recipient, you are strictly prohibited from reviewing, disclosing, copying using or disseminating any of this information or taking any action in reliance on or regarding this information. If you have received this fax in error, please notify us immediately  by telephone so that we can arrange for its return to [place]. Phone: [phone], Toll-Free: [phone], Fax: [phone] Page: 2 of 2 Call Id: [id number] Guidelines Guideline Title Affirmed Question Affirmed Notes Nurse Date/Time Lamount Cohen Time) cause nausea (e.g., narcotics/opiates, antibiotics, OCPs, many others) Disp. Time Lamount Cohen Time) Disposition Final User 11/11/2019 8:10:01 AM Call PCP within 24 Hours Yes Stefano Gaul, RN, Clerance Lav Disagree/Comply Comply Caller Understands Yes PreDisposition Did not know what to do Care Advice Given Per Guideline CALL PCP WITHIN 24 HOURS: * You need to discuss this with your doctor (or NP/PA) within the next 24 hours. CLEAR FLUIDS: * Take clear fluids in small amounts until the nausea is resolved for 8 hours: * Sip water or rehydration liquid (Gatorade or Powerade) CALL BACK IF: * You become worse. Comments User: Art Buff, RN Date/Time Lamount Cohen Time): 11/11/2019 8:15:09 AM warm transferred to the office for appt Referrals REFERRED TO PCP OFFICE

## 2019-11-11 NOTE — Telephone Encounter (Signed)
Patient states that he was dx with strep throat on Monday.Marland Kitchen He is still taking antibiotics . He wants to know if Carmelia Roller can call him something in for nausea & diarrhea ..  [id number] - [place], [place] - [address]

## 2019-11-11 NOTE — Telephone Encounter (Signed)
Will call in Zofran for nausea. Take Imodium for diarrhea, this is otc. Ty.

## 2019-11-11 NOTE — Addendum Note (Signed)
Addended by: Scharlene Gloss B on: 11/11/2019 12:42 PM   Modules accepted: Orders

## 2020-01-18 ENCOUNTER — Telehealth (INDEPENDENT_AMBULATORY_CARE_PROVIDER_SITE_OTHER): Payer: No Typology Code available for payment source | Admitting: Medical

## 2020-01-18 ENCOUNTER — Other Ambulatory Visit: Payer: Self-pay

## 2020-01-18 DIAGNOSIS — J0120 Acute ethmoidal sinusitis, unspecified: Secondary | ICD-10-CM | POA: Diagnosis not present

## 2020-01-18 DIAGNOSIS — J40 Bronchitis, not specified as acute or chronic: Secondary | ICD-10-CM

## 2020-01-18 DIAGNOSIS — J029 Acute pharyngitis, unspecified: Secondary | ICD-10-CM

## 2020-01-18 MED ORDER — AZITHROMYCIN 250 MG PO TABS: ORAL_TABLET | ORAL | 0 refills | Status: DC

## 2020-01-18 MED ORDER — BENZONATATE 100 MG PO CAPS: 100.0000 mg | ORAL_CAPSULE | Freq: Three times a day (TID) | ORAL | 0 refills | Status: DC | PRN

## 2020-01-18 MED ORDER — FLUTICASONE PROPIONATE 50 MCG/ACT NA SUSP: 2.0000 | Freq: Every day | NASAL | 1 refills | Status: DC

## 2020-01-18 NOTE — Progress Notes (Signed)
   Subjective:    Patient ID: Jared Myers, male    DOB: [birth year], 39 y.o.   MRN: [id number]  HPI  Virtual Visit via Video Note  I connected with Jared Myers on 01/18/20 at 11:00 AM EST by a video enabled telemedicine application and verified that I am speaking with the correct person using two identifiers.  Location: Patient: home Provider: office   I discussed the limitations of evaluation and management by telemedicine and the availability of in person appointments. The patient expressed understanding and agreed to proceed.  History of Present Illness: Pt has recent st, cough, sinus pressure and runny nose. Pt is vaccinated against covid and tested with neg result.  Pt states recent signs and symptoms started as weather changes.   Pt states he took about 4 days of augmentin. Seemed to help some but symptoms lingering.  No wheezing and no sob.   Pt states some sinus pressure. More in ethmoid sinus area is where he points.   Reports some mild chest congestion. No wheezing.    Observations/Objective:  General-no acute distress, pleasant, oriented. Lungs- on inspection lungs appear unlabored. Neck- no tracheal deviation or jvd on inspection. Neuro- gross motor function appears intact. heent- ethomid sinus pressure on self palpation.   Assessment and Plan: Symptoms of bronchitis, sinus infection and st.   Prescribed azithromycin antibiotic, benzonatate for cough and flonase nasal spray for nasal congestion.  Signs and symptoms should graudually resolve. If not better by early next week then can follow up in office.  Follow up  5-7 days or as needed. Asked for my chart update Sunday or Monday.  Follow Up Instructions:    I discussed the assessment and treatment plan with the patient. The patient was provided an opportunity to ask questions and all were answered. The patient agreed with the plan and demonstrated an understanding of the instructions.   The  patient was advised to call back or seek an in-person evaluation if the symptoms worsen or if the condition fails to improve as anticipated.  Time spent with patient today was 25  minutes which consisted of chart revdiew, discussing diagnosis, work up treatment and documentation.   Esperanza Richters, PA-C    Review of Systems     Objective:   Physical Exam        Assessment & Plan:

## 2020-01-18 NOTE — Patient Instructions (Addendum)
Symptoms of bronchitis, sinus infection and st.   Prescribed azithromycin antibiotic, benzonatate for cough and flonase nasal spray for nasal congestion.  Signs and symptoms should graudually resolve. If not better by early next week then can follow up in office.  Follow up  5-7 days or as needed. Asked for my chart update Sunday or Monday.

## 2020-02-09 ENCOUNTER — Other Ambulatory Visit: Payer: Self-pay | Admitting: Medical

## 2020-02-11 ENCOUNTER — Encounter: Payer: Self-pay | Admitting: Family Medicine

## 2020-02-13 ENCOUNTER — Telehealth: Payer: Self-pay

## 2020-02-13 NOTE — Telephone Encounter (Signed)
Nurse Assessment Nurse: Oretha Ellis, RN, Will Date/Time Lamount Cohen Time): 02/11/2020 11:15:15 AM Confirm and document reason for call. If symptomatic, describe symptoms. ---caller reports that he has been vaccinated for covid. Sore throat, nasal congestion, and slight headache. Fever denied. Does the patient have any new or worsening symptoms? ---Yes Will a triage be completed? ---Yes Related visit to physician within the last 2 weeks? ---No Does the PT have any chronic conditions? (i.e. diabetes, asthma, this includes High risk factors for pregnancy, etc.) ---No Is this a behavioral health or substance abuse call? ---No Guidelines Guideline Title Affirmed Question Affirmed Notes Nurse Date/Time (Eastern Time) Common Cold Common cold with no complications Oretha Ellis, RN, Will 02/11/2020 11:18:25 AM Disp. Time Lamount Cohen Time) Disposition Final User 02/11/2020 11:20:30 AM Home Care Yes Oretha Ellis, RN, Will Caller Disagree/Comply Comply Caller Understands Yes PreDisposition Call Doctor PLEASE NOTE: All timestamps contained within this report are represented as [place] Standard Time. CONFIDENTIALTY NOTICE: This fax transmission is intended only for the addressee. It contains information that is legally privileged, confidential or otherwise protected from use or disclosure. If you are not the intended recipient, you are strictly prohibited from reviewing, disclosing, copying using or disseminating any of this information or taking any action in reliance on or regarding this information. If you have received this fax in error, please notify us immediately by telephone so that we can arrange for its return to [place]. Phone: [phone], Toll-Free: [phone], Fax: [phone] Page: 2 of 2 Call Id: [id number] Care Advice Given Per Guideline HOME CARE: * You should be able to treat this at home. CALL BACK IF: * You become short of breath * You become worse CARE ADVICE given per Common Cold (Adult)  guideline. Referrals GO TO FACILITY UNDECIDED

## 2020-02-29 ENCOUNTER — Telehealth (INDEPENDENT_AMBULATORY_CARE_PROVIDER_SITE_OTHER): Payer: No Typology Code available for payment source | Admitting: Medical

## 2020-02-29 ENCOUNTER — Other Ambulatory Visit: Payer: Self-pay

## 2020-02-29 ENCOUNTER — Encounter: Payer: Self-pay | Admitting: Medical

## 2020-02-29 VITALS — Temp 98.7°F | Wt 176.6 lb

## 2020-02-29 DIAGNOSIS — R059 Cough, unspecified: Secondary | ICD-10-CM | POA: Diagnosis not present

## 2020-02-29 DIAGNOSIS — J029 Acute pharyngitis, unspecified: Secondary | ICD-10-CM

## 2020-02-29 DIAGNOSIS — J3489 Other specified disorders of nose and nasal sinuses: Secondary | ICD-10-CM | POA: Diagnosis not present

## 2020-02-29 MED ORDER — FLUTICASONE PROPIONATE 50 MCG/ACT NA SUSP: 2.0000 | Freq: Every day | NASAL | 1 refills | Status: DC

## 2020-02-29 MED ORDER — BENZONATATE 100 MG PO CAPS: 100.0000 mg | ORAL_CAPSULE | Freq: Three times a day (TID) | ORAL | 0 refills | Status: DC | PRN

## 2020-02-29 MED ORDER — AZITHROMYCIN 250 MG PO TABS: ORAL_TABLET | ORAL | 0 refills | Status: DC

## 2020-02-29 NOTE — Progress Notes (Signed)
° °  Subjective:    Patient ID: Jared Myers, male    DOB: [birth year], 39 y.o.   MRN: [id number]  HPI  Virtual Visit via Video Note  I connected with Jared Myers on 02/29/20 at  9:40 AM EST by a video enabled telemedicine application and verified that I am speaking with the correct person using two identifiers.  Location: Patient: Work Provider: home  Participants-patient and myself.   I discussed the limitations of evaluation and management by telemedicine and the availability of in person appointments. The patient expressed understanding and agreed to proceed.  History of Present Illness:  Pt states his 39 yr old has been running fever of 101.5. She has clear mucus and coughing on parents. Pt child will liklely get covid test.  Pt today has st, runny nose and pnd. Pt checked his throat and he states sinus are feeling thick with sinus pressure. Pt is not coughing much. Only rare random tickle to throat.  Pt mother is also sick.   Pt has no muscle aches or joint pain. No loss of smell or taste.   Observations/Objective:  General-no acute distress, pleasant, oriented. Lungs- on inspection lungs appear unlabored. Neck- no tracheal deviation or jvd on inspection. Neuro- gross motor function appears intact. HEENT-mild frontal and maxillary sinus pressure to self palpation.  Assessment and Plan: Presents with recent sinus pressure/possible sinusitis, pharyngitis and cough. Young daughter recently sick and might be getting tested for Covid.   Approaching holiday weekend and decided to go ahead and prescribe a azithromycin antibiotic and benzonatate for cough. For nasal congestion making Flonase available.  In light of new omicron variant and other family members with various signs and symptoms, I did recommend getting rapid Covid test and a PCR test.  Writing work excuse note and sending it to my chart.  Follow-up in 7 to 10 days or as needed.  Follow Up Instructions:     I discussed the assessment and treatment plan with the patient. The patient was provided an opportunity to ask questions and all were answered. The patient agreed with the plan and demonstrated an understanding of the instructions.   The patient was advised to call back or seek an in-person evaluation if the symptoms worsen or if the condition fails to improve as anticipated.  Time spent with patient today was 25  minutes which consisted of chart revdiew, discussing diagnosis, work up treatment and documentation.   Esperanza Richters, PA-C   Review of Systems  Constitutional: Negative for chills, fatigue and fever.  HENT: Positive for congestion, sinus pressure, sinus pain and sore throat.   Respiratory: Positive for cough.   Cardiovascular: Negative for chest pain and palpitations.  Gastrointestinal: Negative for abdominal pain.  Neurological: Negative for dizziness and headaches.  Hematological: Negative for adenopathy. Does not bruise/bleed easily.       Objective:   Physical Exam        Assessment & Plan:

## 2020-02-29 NOTE — Patient Instructions (Addendum)
Presents with recent sinus pressure/possible sinusitis, pharyngitis and cough. Young daughter recently sick and might be getting tested for Covid.   Approaching holiday weekend and decided to go ahead and prescribe a azithromycin antibiotic and benzonatate for cough. For nasal congestion making Flonase available.  In light of new omicron variant and other family members with various signs and symptoms, I did recommend getting rapid Covid test and a PCR test.  Writing work excuse note and sending it to my chart.  Follow-up in 7 to 10 days or as needed.

## 2020-03-09 ENCOUNTER — Encounter: Payer: Self-pay | Admitting: Medical

## 2020-03-22 ENCOUNTER — Other Ambulatory Visit: Payer: Self-pay | Admitting: Medical

## 2020-04-07 ENCOUNTER — Other Ambulatory Visit: Payer: Self-pay | Admitting: Medical

## 2020-05-25 ENCOUNTER — Encounter: Payer: Self-pay | Admitting: Family Medicine

## 2020-05-30 NOTE — Telephone Encounter (Signed)
Pt is scheduled for 07/23/20 @ 3:20pm for f/u and physical 12/24/20 at 8:20 am.

## 2020-07-23 ENCOUNTER — Encounter: Payer: Self-pay | Admitting: Family Medicine

## 2020-07-23 ENCOUNTER — Other Ambulatory Visit: Payer: Self-pay

## 2020-07-23 ENCOUNTER — Ambulatory Visit: Payer: No Typology Code available for payment source | Admitting: Family Medicine

## 2020-07-23 ENCOUNTER — Other Ambulatory Visit (HOSPITAL_BASED_OUTPATIENT_CLINIC_OR_DEPARTMENT_OTHER): Payer: Self-pay

## 2020-07-23 VITALS — BP 122/76 | HR 90 | Temp 98.0°F | Resp 16 | Wt 189.2 lb

## 2020-07-23 DIAGNOSIS — R739 Hyperglycemia, unspecified: Secondary | ICD-10-CM | POA: Diagnosis not present

## 2020-07-23 DIAGNOSIS — F430 Acute stress reaction: Secondary | ICD-10-CM

## 2020-07-23 DIAGNOSIS — M6289 Other specified disorders of muscle: Secondary | ICD-10-CM

## 2020-07-23 DIAGNOSIS — K5900 Constipation, unspecified: Secondary | ICD-10-CM

## 2020-07-23 DIAGNOSIS — Z7189 Other specified counseling: Secondary | ICD-10-CM

## 2020-07-23 DIAGNOSIS — E782 Mixed hyperlipidemia: Secondary | ICD-10-CM | POA: Diagnosis not present

## 2020-07-23 DIAGNOSIS — F411 Generalized anxiety disorder: Secondary | ICD-10-CM

## 2020-07-23 MED ORDER — TIZANIDINE HCL 4 MG PO TABS
4.0000 mg | ORAL_TABLET | Freq: Three times a day (TID) | ORAL | 3 refills | Status: DC | PRN
  Filled 2020-07-23: qty 60, 20d supply, fill #0

## 2020-07-23 NOTE — Assessment & Plan Note (Signed)
Most of his stress was related to his job and he is now finally in a permanent position at the [employer] and he is much less stressed

## 2020-07-23 NOTE — Assessment & Plan Note (Signed)
maintain heart healthy diet, increase exercise, avoid trans fats, consider a krill oil cap daily 

## 2020-07-23 NOTE — Assessment & Plan Note (Signed)
He had the initial 2 mRNA shots but has not had a booster yet. With 2 little kids at home he is encouraged to have a booster if he is willing

## 2020-07-23 NOTE — Assessment & Plan Note (Signed)
hgba1c acceptable, minimize simple carbs. Increase exercise as tolerated.  

## 2020-07-23 NOTE — Progress Notes (Signed)
Patient ID: Jared Myers, male    DOB: [birth year]  Age: 40 y.o. MRN: [id number]    Subjective:  Subjective  HPI SHIVANSH HARDAWAY presents for office visit today for follow up on pelvic and back pain. He reports that his pelvic pain is getting worse. He states that a couple of weeks ago he experienced coughing due to being outside and getting exposed to pollen. He denies any chest pain, SOB, fever, abdominal pain, cough, chills, sore throat, dysuria, urinary incontinence, back pain, HA, or N/VD. He reports that he and his family have caught a stomach bug last month and experienced bad symptoms.   Review of Systems  Constitutional: Negative for chills, fatigue and fever.  HENT: Negative for congestion, rhinorrhea, sinus pressure, sinus pain and sore throat.   Eyes: Negative for pain.  Respiratory: Positive for cough (secondary to pollen allergies). Negative for shortness of breath.   Cardiovascular: Negative for chest pain, palpitations and leg swelling.  Gastrointestinal: Negative for abdominal pain, blood in stool, constipation, diarrhea, nausea and vomiting.  Genitourinary: Negative for flank pain, frequency and penile pain.  Musculoskeletal: Negative for back pain.       (+) Pelvic pain  Neurological: Negative for headaches.    History Past Medical History:  Diagnosis Date  . 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 Constipation 12/18/2013  . Hyperglycemia 09/28/2014  . Hyperlipidemia, mixed 06/22/2014  . Infectious mononucleosis 07/18/2013  . Inguinal hernia 05/12/2012  . Low back pain 12/16/2013  . Overweight 06/22/2014  . Pelvic floor dysfunction   . Premature ventricular beat   . Splenomegaly 07/18/2013  . Tremulousness 08/14/2013    He has a past surgical history that includes No past surgeries.   His family history includes Anemia in his sister; Cancer in his mother and paternal uncle; Depression in his brother; Heart disease in his maternal grandfather, maternal  grandmother, and paternal grandfather; Other in his father.He reports that he has never smoked. He has never used smokeless tobacco. He reports that he does not drink alcohol and does not use drugs.  Current Outpatient Medications on File Prior to Visit  Medication Sig Dispense Refill  . fluticasone (FLONASE) 50 MCG/ACT nasal spray Place 2 sprays into both nostrils daily. 48 mL 3   No current facility-administered medications on file prior to visit.     Objective:  Objective  Physical Exam Constitutional:      General: He is not in acute distress.    Appearance: Normal appearance. He is not ill-appearing or toxic-appearing.  HENT:     Head: Normocephalic and atraumatic.     Right Ear: Tympanic membrane, ear canal and external ear normal.     Left Ear: Tympanic membrane, ear canal and external ear normal.     Nose: No congestion or rhinorrhea.  Eyes:     Extraocular Movements: Extraocular movements intact.     Pupils: Pupils are equal, round, and reactive to light.  Cardiovascular:     Rate and Rhythm: Normal rate and regular rhythm.     Pulses: Normal pulses.     Heart sounds: Normal heart sounds. No murmur heard.   Pulmonary:     Effort: Pulmonary effort is normal. No respiratory distress.     Breath sounds: Normal breath sounds. No wheezing, rhonchi or rales.  Abdominal:     General: Bowel sounds are normal.     Palpations: Abdomen is soft. There is no mass.     Tenderness: There is no abdominal  tenderness. There is no guarding.     Hernia: No hernia is present.  Musculoskeletal:        General: Normal range of motion.     Cervical back: Normal range of motion and neck supple.  Skin:    General: Skin is warm and dry.  Neurological:     Mental Status: He is alert and oriented to person, place, and time.  Psychiatric:        Behavior: Behavior normal.    BP 122/76   Pulse 90   Temp 98 F (36.7 C)   Resp 16   Wt 189 lb 3.2 oz (85.8 kg)   SpO2 96%   BMI 29.63  kg/m  Wt Readings from Last 3 Encounters:  07/23/20 189 lb 3.2 oz (85.8 kg)  02/29/20 176 lb 9.6 oz (80.1 kg)  11/07/19 183 lb 4 oz (83.1 kg)     Lab Results  Component Value Date   WBC 5.8 09/22/2019   HGB 15.2 09/22/2019   HCT 43.2 09/22/2019   PLT 264.0 09/22/2019   GLUCOSE 100 (H) 09/22/2019   CHOL 178 10/27/2017   TRIG 170.0 (H) 10/27/2017   HDL 37.30 (L) 10/27/2017   LDLDIRECT 100.0 02/19/2017   LDLCALC 107 (H) 10/27/2017   ALT 23 09/22/2019   AST 18 09/22/2019   NA 138 09/22/2019   K 3.9 09/22/2019   CL 102 09/22/2019   CREATININE 1.10 09/22/2019   BUN 10 09/22/2019   CO2 30 09/22/2019   TSH 0.97 09/22/2019   PSA 0.52 01/24/2014   HGBA1C 5.1 09/22/2019    CT Head Wo Contrast  Result Date: 09/22/2019 CLINICAL DATA:  Numbness on both sides of face for 2 days. EXAM: CT HEAD WITHOUT CONTRAST TECHNIQUE: Contiguous axial images were obtained from the base of the skull through the vertex without intravenous contrast. COMPARISON:  None. FINDINGS: Brain: The ventricles are normal in size and configuration. No extra-axial fluid collections are identified. The gray-white differentiation is maintained. No CT findings for acute hemispheric infarction or intracranial hemorrhage. No mass lesions. The brainstem and cerebellum are normal. Vascular: No hyperdense vessels or obvious aneurysm. Skull: No acute skull fracture. No bone lesion. Sinuses/Orbits: The paranasal sinuses and mastoid air cells are clear. The globes are intact. Other: No scalp lesions, laceration or hematoma. IMPRESSION: Normal head CT. Electronically Signed   By: Rudie Meyer M.D.   On: 09/22/2019 12:24     Assessment & Plan:  Plan    Meds ordered this encounter  Medications  . tiZANidine (ZANAFLEX) 4 MG tablet    Sig: Take 1 tablet (4 mg total) by mouth every 8 (eight) hours as needed for muscle spasms.    Dispense:  60 tablet    Refill:  3    Problem List Items Addressed This Visit    Anxiety in acute  stress reaction    Most of his stress was related to his job and he is now finally in a permanent position at the [employer] and he is much less stressed      Pelvic floor dysfunction    Doing fairly well although he does note flaring from picking up his young children. Is allowed a refill on Tizanidine to use prn      Relevant Orders   TSH   Constipation   Relevant Orders   CBC   TSH   Hyperlipidemia, mixed - Primary    maintain heart healthy diet, increase exercise, avoid trans fats, consider a krill oil  cap daily       Relevant Orders   Comprehensive metabolic panel   Lipid panel   TSH   Hyperglycemia    hgba1c acceptable, minimize simple carbs. Increase exercise as tolerated.       Relevant Orders   Hemoglobin A1c   Educated about COVID-19 virus infection    He had the initial 2 mRNA shots but has not had a booster yet. With 2 little kids at home he is encouraged to have a booster if he is willing         Follow-up: Return has cpe in october.   I,David Hanna,acting as a scribe for Danise Edge, MD.,have documented all relevant documentation on the behalf of Danise Edge, MD, disrected by  Danise Edge, MD while in the presence of Danise Edge, MD.  I, Bradd Canary, MD personally performed the services described in this documentation. All medical record entries made by the scribe were at my direction and in my presence. I have reviewed the chart and agree that the record reflects my personal performance and is accurate and complete

## 2020-07-23 NOTE — Assessment & Plan Note (Signed)
Doing fairly well although he does note flaring from picking up his young children. Is allowed a refill on Tizanidine to use prn

## 2020-07-23 NOTE — Patient Instructions (Addendum)
Pelvic Floor Dysfunction  Pelvic floor dysfunction (PFD) is a condition that results when the group of muscles and connective tissues that support the organs in the pelvis (pelvic floor muscles) do not work well. These muscles and their connections form a sling that supports the colon and bladder. In men, these muscles also support the prostate gland. In women, they also support the uterus. PFD causes pelvic floor muscles to be too weak, too tight, or a combination of both. In PFD, muscle movements are not coordinated. This condition may cause bowel or bladder problems. It may also cause pain. What are the causes? This condition may be caused by an injury to the pelvic area or by a weakening of pelvic muscles. This often results from pregnancy and childbirth or other types of strain. In many cases, the exact cause is not known. What increases the risk? The following factors may make you more likely to develop this condition:  Having a condition of chronic bladder tissue inflammation (interstitial cystitis).  Being an older person.  Being overweight.  Radiation treatment for cancer in the pelvic region.  Previous pelvic surgery, such as removal of the uterus (hysterectomy) or prostate gland (prostatectomy). What are the signs or symptoms? Symptoms of this condition vary and may include:  Bladder symptoms, such as: ? Trouble starting urination and emptying the bladder. ? Frequent urinary tract infections. ? Leaking urine when coughing, laughing, or exercising (stress incontinence). ? Having to pass urine urgently or frequently. ? Pain when passing urine.  Bowel symptoms, such as: ? Constipation. ? Urgent or frequent bowel movements. ? Incomplete bowel movements. ? Painful bowel movements. ? Leaking stool or gas.  Unexplained genital or rectal pain.  Genital or rectal muscle spasms.  Low back pain. In women, symptoms of PFD may also include:  A heavy, full, or aching feeling in  the vagina.  A bulge that protrudes into the vagina.  Pain during or after sexual intercourse. How is this diagnosed? This condition may be diagnosed based on:  Your symptoms and medical history.  A physical exam. During the exam, your health care provider may check your pelvic muscles for tightness, spasm, pain, or weakness. This may include a rectal exam and a pelvic exam for women. In some cases, you may have diagnostic tests, such as:  Electrical muscle function tests.  Urine flow testing.  X-ray tests of bowel function.  Ultrasound of the pelvic organs. How is this treated? Treatment for this condition depends on your symptoms. Treatment options include:  Physical therapy. This may include Kegel exercises to help relax or strengthen the pelvic floor muscles.  Biofeedback. This type of therapy provides feedback on how tight your pelvic floor muscles are so that you can learn to control them.  Internal or external massage therapy.  A treatment that involves electrical stimulation of the pelvic floor muscles to help control pain (transcutaneous electrical nerve stimulation, or TENS).  Sound wave therapy (ultrasound) to reduce muscle spasms.  Medicines, such as: ? Muscle relaxants. ? Bladder control medicines. Surgery to reconstruct or support pelvic floor muscles may be an option if other treatments do not help. Follow these instructions at home: Activity  Do your usual activities as told by your health care provider. Ask your health care provider if you should modify any activities.  Do pelvic floor strengthening or relaxing exercises at home as told by your physical therapist. Lifestyle  Maintain a healthy weight.  Eat foods that are high in fiber, such as   beans, whole grains, and fresh fruits and vegetables.  Limit foods that are high in fat and processed sugars, such as fried or sweet foods.  Manage stress with relaxation techniques such as yoga or  meditation. General instructions  If you have problems with leakage: ? Use absorbable pads or wear padded underwear. ? Wash frequently with mild soap. ? Keep your genital and anal area as clean and dry as possible. ? Ask your health care provider if you should try a barrier cream to prevent skin irritation.  Take warm baths to relieve pelvic muscle tension or spasms.  Take over-the-counter and prescription medicines only as told by your health care provider.  Keep all follow-up visits as told by your health care provider. This is important. Contact a health care provider if you:  Are not improving with home care.  Have signs or symptoms of PFD that get worse at home.  Develop new signs or symptoms at home.  Have signs of a urinary tract infection, such as: ? Fever. ? Chills. ? Urinary frequency. ? A burning feeling when urinating.  Have not had a bowel movement in 3 days (constipation). Summary  Pelvic floor dysfunction results when the muscles and connective tissues in your pelvic floor do not work well.  These muscles and their connections form a sling that supports your colon and bladder. In men, these muscles also support the prostate gland. In women, they also support the uterus.  PFD may be caused by an injury to the pelvic area or by a weakening of pelvic muscles.  PFD causes pelvic floor muscles to be too weak, too tight, or a combination of both. Symptoms may vary from person to person.  In most cases, PFD can be treated with physical therapies and medicines. Surgery may be an option if other treatments do not help. This information is not intended to replace advice given to you by your health care provider. Make sure you discuss any questions you have with your health care provider. Document Revised: 09/14/2017 Document Reviewed: 09/14/2017 [employer] Patient Education  2021 [employer]  

## 2020-07-24 LAB — LIPID PANEL
Cholesterol: 185 mg/dL (ref 0–200)
HDL: 36.5 mg/dL — ABNORMAL LOW (ref >39.00)
NonHDL: 148.85
Total CHOL/HDL Ratio: 5
Triglycerides: 317 mg/dL — ABNORMAL HIGH (ref 0.0–149.0)
VLDL: 63.4 mg/dL — ABNORMAL HIGH (ref 0.0–40.0)

## 2020-07-24 LAB — COMPREHENSIVE METABOLIC PANEL
ALT: 22 U/L (ref 0–53)
AST: 17 U/L (ref 0–37)
Albumin: 4.5 g/dL (ref 3.5–5.2)
Alkaline Phosphatase: 61 U/L (ref 39–117)
BUN: 11 mg/dL (ref 6–23)
CO2: 32 mEq/L (ref 19–32)
Calcium: 9.4 mg/dL (ref 8.4–10.5)
Chloride: 101 mEq/L (ref 96–112)
Creatinine, Ser: 1.02 mg/dL (ref 0.40–1.50)
GFR: 92.07 mL/min (ref >60.00)
Glucose, Bld: 81 mg/dL (ref 70–99)
Potassium: 3.9 mEq/L (ref 3.5–5.1)
Sodium: 138 mEq/L (ref 135–145)
Total Bilirubin: 0.5 mg/dL (ref 0.2–1.2)
Total Protein: 6.8 g/dL (ref 6.0–8.3)

## 2020-07-24 LAB — TSH: TSH: 0.72 u[IU]/mL (ref 0.35–4.50)

## 2020-07-24 LAB — CBC
HCT: 41.9 % (ref 39.0–52.0)
Hemoglobin: 15 g/dL (ref 13.0–17.0)
MCHC: 35.8 g/dL (ref 30.0–36.0)
MCV: 85.3 fl (ref 78.0–100.0)
Platelets: 290 10*3/uL (ref 150.0–400.0)
RBC: 4.91 Mil/uL (ref 4.22–5.81)
RDW: 12.8 % (ref 11.5–15.5)
WBC: 6.6 10*3/uL (ref 4.0–10.5)

## 2020-07-24 LAB — LDL CHOLESTEROL, DIRECT: Direct LDL: 120 mg/dL

## 2020-07-24 LAB — HEMOGLOBIN A1C: Hgb A1c MFr Bld: 5.3 % (ref 4.6–6.5)

## 2020-12-24 ENCOUNTER — Encounter: Payer: Self-pay | Admitting: Family Medicine

## 2020-12-24 ENCOUNTER — Other Ambulatory Visit: Payer: Self-pay

## 2020-12-24 ENCOUNTER — Ambulatory Visit (INDEPENDENT_AMBULATORY_CARE_PROVIDER_SITE_OTHER): Payer: No Typology Code available for payment source | Admitting: Family Medicine

## 2020-12-24 VITALS — BP 112/78 | HR 79 | Temp 98.1°F | Resp 16 | Ht 67.0 in | Wt 189.2 lb

## 2020-12-24 DIAGNOSIS — M25571 Pain in right ankle and joints of right foot: Secondary | ICD-10-CM

## 2020-12-24 DIAGNOSIS — Z23 Encounter for immunization: Secondary | ICD-10-CM | POA: Diagnosis not present

## 2020-12-24 DIAGNOSIS — E663 Overweight: Secondary | ICD-10-CM

## 2020-12-24 DIAGNOSIS — R0789 Other chest pain: Secondary | ICD-10-CM

## 2020-12-24 DIAGNOSIS — E782 Mixed hyperlipidemia: Secondary | ICD-10-CM

## 2020-12-24 DIAGNOSIS — Z7189 Other specified counseling: Secondary | ICD-10-CM

## 2020-12-24 DIAGNOSIS — Z0000 Encounter for general adult medical examination without abnormal findings: Secondary | ICD-10-CM

## 2020-12-24 DIAGNOSIS — G8929 Other chronic pain: Secondary | ICD-10-CM

## 2020-12-24 DIAGNOSIS — R739 Hyperglycemia, unspecified: Secondary | ICD-10-CM | POA: Diagnosis not present

## 2020-12-24 LAB — CBC WITH DIFFERENTIAL/PLATELET
Basophils Absolute: 0 10*3/uL (ref 0.0–0.1)
Basophils Relative: 0.6 % (ref 0.0–3.0)
Eosinophils Absolute: 0.1 10*3/uL (ref 0.0–0.7)
Eosinophils Relative: 1.2 % (ref 0.0–5.0)
HCT: 43.4 % (ref 39.0–52.0)
Hemoglobin: 15.1 g/dL (ref 13.0–17.0)
Lymphocytes Relative: 32.6 % (ref 12.0–46.0)
Lymphs Abs: 1.7 10*3/uL (ref 0.7–4.0)
MCHC: 34.7 g/dL (ref 30.0–36.0)
MCV: 87.3 fl (ref 78.0–100.0)
Monocytes Absolute: 0.5 10*3/uL (ref 0.1–1.0)
Monocytes Relative: 10.2 % (ref 3.0–12.0)
Neutro Abs: 3 10*3/uL (ref 1.4–7.7)
Neutrophils Relative %: 55.4 % (ref 43.0–77.0)
Platelets: 253 10*3/uL (ref 150.0–400.0)
RBC: 4.97 Mil/uL (ref 4.22–5.81)
RDW: 13.2 % (ref 11.5–15.5)
WBC: 5.4 10*3/uL (ref 4.0–10.5)

## 2020-12-24 LAB — COMPREHENSIVE METABOLIC PANEL
ALT: 16 U/L (ref 0–53)
AST: 13 U/L (ref 0–37)
Albumin: 4.6 g/dL (ref 3.5–5.2)
Alkaline Phosphatase: 59 U/L (ref 39–117)
BUN: 12 mg/dL (ref 6–23)
CO2: 28 mEq/L (ref 19–32)
Calcium: 9.5 mg/dL (ref 8.4–10.5)
Chloride: 101 mEq/L (ref 96–112)
Creatinine, Ser: 0.99 mg/dL (ref 0.40–1.50)
GFR: 95.15 mL/min (ref >60.00)
Glucose, Bld: 95 mg/dL (ref 70–99)
Potassium: 4.2 mEq/L (ref 3.5–5.1)
Sodium: 136 mEq/L (ref 135–145)
Total Bilirubin: 0.9 mg/dL (ref 0.2–1.2)
Total Protein: 7.1 g/dL (ref 6.0–8.3)

## 2020-12-24 LAB — LIPID PANEL
Cholesterol: 174 mg/dL (ref 0–200)
HDL: 38.5 mg/dL — ABNORMAL LOW (ref >39.00)
LDL Cholesterol: 112 mg/dL — ABNORMAL HIGH (ref 0–99)
NonHDL: 135.67
Total CHOL/HDL Ratio: 5
Triglycerides: 117 mg/dL (ref 0.0–149.0)
VLDL: 23.4 mg/dL (ref 0.0–40.0)

## 2020-12-24 LAB — HEMOGLOBIN A1C: Hgb A1c MFr Bld: 5.1 % (ref 4.6–6.5)

## 2020-12-24 LAB — TSH: TSH: 0.67 u[IU]/mL (ref 0.35–5.50)

## 2020-12-24 NOTE — Progress Notes (Signed)
Subjective:   By signing my name below, I, Jared Myers, attest that this documentation has been prepared under the direction and in the presence of Jared Canary, MD. 12/24/2020     Patient ID: Jared Myers, male    DOB: 1980/06/20, 40 y.o.   MRN: [id number]  Chief Complaint  Patient presents with   Annual Exam    HPI Patient is in today for a comprehensive physical exam and evaluation of medical concerns.  He reports he is doing well.  He was teaching his daughter how to skate and started experiencing pain in his left ankle. He got an Xray, was told to stop skating for 6 weeks and is using Voltaren and ice to manage the pain.  He also reports he has gained a little bit of weight since he stopped going to the [facility] program. He is also under some stress at church but is trying to follow a healthy diet.  He mentions he was experiencing left chest pain 2 weeks ago and describes it as sharp, shooting pains that did not radiate to any part of the body. The pain lasted for 2 days and was not exacerbated by anything. There were no associated symptoms. The pain has been resolved at this time.  He denies fever, congestion, eye pain, chest pain, palpitations, leg swelling, shortness of breath, nausea, abdominal pain, diarrhea and blood in stool. Also denies dysuria, frequency, back pain and headaches.   He is willing to get the flu vaccine at this visit.  There has been no recent changes in his family history.  Past Medical History:  Diagnosis Date   Anxiety in acute stress reaction 01/06/2011   Constipation 12/18/2013   Hyperglycemia 09/28/2014   Hyperlipidemia, mixed 06/22/2014   Infectious mononucleosis 07/18/2013   Inguinal hernia 05/12/2012   Low back pain 12/16/2013   Overweight 06/22/2014   Pelvic floor dysfunction    Premature ventricular beat    Splenomegaly 07/18/2013   Tremulousness 08/14/2013    Past Surgical History:  Procedure Laterality Date   NO  PAST SURGERIES      Family History  Problem Relation Age of Onset   Cancer Mother        Non Hodkins lymphoma, breast cancer   Other Father        pituatary adenoma   Cancer Paternal Uncle        recurrent prostate    Heart disease Maternal Grandmother        chf   Heart disease Maternal Grandfather        chf   Heart disease Paternal Grandfather        chf   Depression Brother    Anemia Sister     Social History   Socioeconomic History   Marital status: Married    Spouse name: Not on file   Number of children: Not on file   Years of education: Not on file   Highest education level: Not on file  Occupational History   Not on file  Tobacco Use   Smoking status: Never   Smokeless tobacco: Never  Substance and Sexual Activity   Alcohol use: No   Drug use: No   Sexual activity: Yes    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, works at [employer], 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  Other Topics Concern   Not on file  Social History Narrative   New baby on the way 07/15/16   Wife is 13 weeks  Social Determinants of Health   Financial Resource Strain: Not on file  Food Insecurity: Not on file  Transportation Needs: Not on file  Physical Activity: Not on file  Stress: Not on file  Social Connections: Not on file  Intimate Partner Violence: Not on file    Outpatient Medications Prior to Visit  Medication Sig Dispense Refill   fluticasone (FLONASE) 50 MCG/ACT nasal spray Place 2 sprays into both nostrils daily. 48 mL 3   tiZANidine (ZANAFLEX) 4 MG tablet Take 1 tablet (4 mg total) by mouth every 8 (eight) hours as needed for muscle spasms. 60 tablet 3   No facility-administered medications prior to visit.    Allergies  Allergen Reactions   Guaifenesin     REACTION: PVCs   Prednisone     PVC    Review of Systems  Constitutional:  Negative for fever.  HENT:  Negative for congestion.   Eyes:  Negative for pain.  Respiratory:  Negative for shortness of breath.    Cardiovascular:  Negative for chest pain, palpitations and leg swelling.  Gastrointestinal:  Negative for abdominal pain, blood in stool, diarrhea and nausea.  Genitourinary:  Negative for dysuria and frequency.  Musculoskeletal:  Positive for joint pain (left ankle). Negative for back pain.  Neurological:  Negative for headaches.      Objective:    Physical Exam Constitutional:      Appearance: Normal appearance. He is not ill-appearing.  HENT:     Head: Normocephalic and atraumatic.     Right Ear: Tympanic membrane, ear canal and external ear normal.     Left Ear: Tympanic membrane, ear canal and external ear normal.  Eyes:     Conjunctiva/sclera: Conjunctivae normal.     Comments: No nystagmus  Cardiovascular:     Rate and Rhythm: Normal rate and regular rhythm.     Heart sounds: Normal heart sounds. No murmur heard. Pulmonary:     Breath sounds: Normal breath sounds. No wheezing.  Abdominal:     General: Bowel sounds are normal. There is no distension.     Palpations: Abdomen is soft.     Tenderness: There is no abdominal tenderness.     Hernia: No hernia is present.  Musculoskeletal:     Cervical back: Neck supple.     Comments: 5/5 strength in upper and lower extremities  Lymphadenopathy:     Cervical: No cervical adenopathy.  Skin:    General: Skin is warm and dry.  Neurological:     Mental Status: He is alert and oriented to person, place, and time.     Deep Tendon Reflexes:     Reflex Scores:      Patellar reflexes are 2+ on the right side and 2+ on the left side. Psychiatric:        Behavior: Behavior normal.    BP 112/78   Pulse 79   Temp 98.1 F (36.7 C)   Resp 16   Ht 5\' 7"  (1.702 m)   Wt 189 lb 3.2 oz (85.8 kg)   SpO2 99%   BMI 29.63 kg/m  Wt Readings from Last 3 Encounters:  12/24/20 189 lb 3.2 oz (85.8 kg)  07/23/20 189 lb 3.2 oz (85.8 kg)  02/29/20 176 lb 9.6 oz (80.1 kg)    Diabetic Foot Exam - Simple   No data filed    Lab Results   Component Value Date   WBC 5.4 12/24/2020   HGB 15.1 12/24/2020   HCT 43.4 12/24/2020  PLT 253.0 12/24/2020   GLUCOSE 95 12/24/2020   CHOL 174 12/24/2020   TRIG 117.0 12/24/2020   HDL 38.50 (L) 12/24/2020   LDLDIRECT 120.0 07/23/2020   LDLCALC 112 (H) 12/24/2020   ALT 16 12/24/2020   AST 13 12/24/2020   NA 136 12/24/2020   K 4.2 12/24/2020   CL 101 12/24/2020   CREATININE 0.99 12/24/2020   BUN 12 12/24/2020   CO2 28 12/24/2020   TSH 0.67 12/24/2020   PSA 0.52 01/24/2014   HGBA1C 5.1 12/24/2020    Lab Results  Component Value Date   TSH 0.67 12/24/2020   Lab Results  Component Value Date   WBC 5.4 12/24/2020   HGB 15.1 12/24/2020   HCT 43.4 12/24/2020   MCV 87.3 12/24/2020   PLT 253.0 12/24/2020   Lab Results  Component Value Date   NA 136 12/24/2020   K 4.2 12/24/2020   CO2 28 12/24/2020   GLUCOSE 95 12/24/2020   BUN 12 12/24/2020   CREATININE 0.99 12/24/2020   BILITOT 0.9 12/24/2020   ALKPHOS 59 12/24/2020   AST 13 12/24/2020   ALT 16 12/24/2020   PROT 7.1 12/24/2020   ALBUMIN 4.6 12/24/2020   CALCIUM 9.5 12/24/2020   ANIONGAP 7 06/06/2014   GFR 95.15 12/24/2020   Lab Results  Component Value Date   CHOL 174 12/24/2020   Lab Results  Component Value Date   HDL 38.50 (L) 12/24/2020   Lab Results  Component Value Date   LDLCALC 112 (H) 12/24/2020   Lab Results  Component Value Date   TRIG 117.0 12/24/2020   Lab Results  Component Value Date   CHOLHDL 5 12/24/2020   Lab Results  Component Value Date   HGBA1C 5.1 12/24/2020       Assessment & Plan:   Problem List Items Addressed This Visit     Atypical chest pain    Had fleeting episodes of sharper chest pain towards left shoulder. No associated symptoms. He is carrying his heavy toddler in that arm which is likely the cause. Encouraged stretching and topical rubs. Report if changes or worsens. He notes it could also be related to stress but he only had a couple of fleeting episodes  over a couple of days awhile back and it is now resolved. Check an EKG to confirm no changes, unlikely to be cardiac      Relevant Orders   EKG 12-Lead (Completed)   Preventative health care - Primary    Patient encouraged to maintain heart healthy diet, regular exercise, adequate sleep. Consider daily probiotics. Take medications as prescribed. Labs ordered and reviewed      Relevant Orders   Flu Vaccine QUAD 36+ mos IM (Fluarix, Fluzone & Afluria Quad PF (Completed)   Hyperlipidemia, mixed    Encourage heart healthy diet such as MIND or DASH diet, increase exercise, avoid trans fats, simple carbohydrates and processed foods, consider a krill or fish or flaxseed oil cap daily.       Relevant Orders   CBC with Differential/Platelet (Completed)   Comprehensive metabolic panel (Completed)   Lipid panel (Completed)   TSH (Completed)   Overweight    Encouraged DASH or MIND diet, decrease po intake and increase exercise as tolerated. Needs 7-8 hours of sleep nightly. Avoid trans fats, eat small, frequent meals every 4-5 hours with lean proteins, complex carbs and healthy fats. Minimize simple carbs, high fat foods and processed foods      Hyperglycemia    hgba1c  acceptable, minimize simple carbs. Increase exercise as tolerated.       Relevant Orders   CBC with Differential/Platelet (Completed)   Comprehensive metabolic panel (Completed)   Lipid panel (Completed)   TSH (Completed)   Hemoglobin A1c (Completed)   Right ankle pain    Has seen ortho. It flared when he started ice skating again. He is on a break and is improving. Using Voltaren.      Educated about COVID-19 virus infection    Encouraged to get a Bivalent vaccine.        No orders of the defined types were placed in this encounter.   I,Jared Myers,acting as a Neurosurgeon for Danise Edge, MD.,have documented all relevant documentation on the behalf of Danise Edge, MD,as directed by  Danise Edge, MD while in the  presence of Danise Edge, MD.   I, Jared Canary, MD., personally preformed the services described in this documentation.  All medical record entries made by the scribe were at my direction and in my presence.  I have reviewed the chart and discharge instructions (if applicable) and agree that the record reflects my personal performance and is accurate and complete. 12/24/2020

## 2020-12-24 NOTE — Assessment & Plan Note (Signed)
Encourage heart healthy diet such as MIND or DASH diet, increase exercise, avoid trans fats, simple carbohydrates and processed foods, consider a krill or fish or flaxseed oil cap daily.  °

## 2020-12-24 NOTE — Assessment & Plan Note (Signed)
Encouraged to get a Bivalent vaccine.

## 2020-12-24 NOTE — Assessment & Plan Note (Signed)
Has seen ortho. It flared when he started ice skating again. He is on a break and is improving. Using Voltaren.

## 2020-12-24 NOTE — Assessment & Plan Note (Signed)
hgba1c acceptable, minimize simple carbs. Increase exercise as tolerated.  

## 2020-12-24 NOTE — Assessment & Plan Note (Signed)
Encouraged DASH or MIND diet, decrease po intake and increase exercise as tolerated. Needs 7-8 hours of sleep nightly. Avoid trans fats, eat small, frequent meals every 4-5 hours with lean proteins, complex carbs and healthy fats. Minimize simple carbs, high fat foods and processed foods 

## 2020-12-24 NOTE — Assessment & Plan Note (Addendum)
Had fleeting episodes of sharper chest pain towards left shoulder. No associated symptoms. He is carrying his heavy toddler in that arm which is likely the cause. Encouraged stretching and topical rubs. Report if changes or worsens. He notes it could also be related to stress but he only had a couple of fleeting episodes over a couple of days awhile back and it is now resolved. Check an EKG to confirm no changes, unlikely to be cardiac

## 2020-12-24 NOTE — Assessment & Plan Note (Addendum)
Patient encouraged to maintain heart healthy diet, regular exercise, adequate sleep. Consider daily probiotics. Take medications as prescribed. Labs ordered and reviewed 

## 2020-12-24 NOTE — Patient Instructions (Signed)
MIND diet and use the fist size for portion control  9800 or 6800 steps with 1/2 hour intensity activity   Preventive Care 67-40 Years Old, Male Preventive care refers to lifestyle choices and visits with your health care provider that can promote health and wellness. This includes: A yearly physical exam. This is also called an annual wellness visit. Regular dental and eye exams. Immunizations. Screening for certain conditions. Healthy lifestyle choices, such as: Eating a healthy diet. Getting regular exercise. Not using drugs or products that contain nicotine and tobacco. Limiting alcohol use. What can I expect for my preventive care visit? Physical exam Your health care provider will check your: Height and weight. These may be used to calculate your BMI (body mass index). BMI is a measurement that tells if you are at a healthy weight. Heart rate and blood pressure. Body temperature. Skin for abnormal spots. Counseling Your health care provider may ask you questions about your: Past medical problems. Family's medical history. Alcohol, tobacco, and drug use. Emotional well-being. Home life and relationship well-being. Sexual activity. Diet, exercise, and sleep habits. Work and work Astronomer. Access to firearms. What immunizations do I need? Vaccines are usually given at various ages, according to a schedule. Your health care provider will recommend vaccines for you based on your age, medical history, and lifestyle or other factors, such as travel or where you work. What tests do I need? Blood tests Lipid and cholesterol levels. These may be checked every 5 years, or more often if you are over 29 years old. Hepatitis C test. Hepatitis B test. Screening Lung cancer screening. You may have this screening every year starting at age 24 if you have a 30-pack-year history of smoking and currently smoke or have quit within the past 15 years. Prostate cancer screening.  Recommendations will vary depending on your family history and other risks. Genital exam to check for testicular cancer or hernias. Colorectal cancer screening. All adults should have this screening starting at age 65 and continuing until age 65. Your health care provider may recommend screening at age 73 if you are at increased risk. You will have tests every 1-10 years, depending on your results and the type of screening test. Diabetes screening. This is done by checking your blood sugar (glucose) after you have not eaten for a while (fasting). You may have this done every 1-3 years. STD (sexually transmitted disease) testing, if you are at risk. Follow these instructions at home: Eating and drinking  Eat a diet that includes fresh fruits and vegetables, whole grains, lean protein, and low-fat dairy products. Take vitamin and mineral supplements as recommended by your health care provider. Do not drink alcohol if your health care provider tells you not to drink. If you drink alcohol: Limit how much you have to 0-2 drinks a day. Be aware of how much alcohol is in your drink. In the [place] one drink equals one 12 oz bottle of beer (355 mL), one 5 oz glass of wine (148 mL), or one 1 oz glass of hard liquor (44 mL). Lifestyle Take daily care of your teeth and gums. Brush your teeth every morning and night with fluoride toothpaste. Floss one time each day. Stay active. Exercise for at least 30 minutes 5 or more days each week. Do not use any products that contain nicotine or tobacco, such as cigarettes, e-cigarettes, and chewing tobacco. If you need help quitting, ask your health care provider. Do not use drugs. If you are sexually  active, practice safe sex. Use a condom or other form of protection to prevent STIs (sexually transmitted infections). If told by your health care provider, take low-dose aspirin daily starting at age 33. Find healthy ways to cope with stress, such as: Meditation,  yoga, or listening to music. Journaling. Talking to a trusted person. Spending time with friends and family. Safety Always wear your seat belt while driving or riding in a vehicle. Do not drive: If you have been drinking alcohol. Do not ride with someone who has been drinking. When you are tired or distracted. While texting. Wear a helmet and other protective equipment during sports activities. If you have firearms in your house, make sure you follow all gun safety procedures. What's next? Go to your health care provider once a year for an annual wellness visit. Ask your health care provider how often you should have your eyes and teeth checked. Stay up to date on all vaccines. This information is not intended to replace advice given to you by your health care provider. Make sure you discuss any questions you have with your health care provider. Document Revised: 05/04/2020 Document Reviewed: 02/18/2018 Elsevier Patient Education  2022 [employer].

## 2021-04-07 ENCOUNTER — Encounter: Payer: Self-pay | Admitting: Family Medicine

## 2021-04-08 ENCOUNTER — Telehealth: Payer: Self-pay

## 2021-04-08 NOTE — Telephone Encounter (Signed)
Appt scheduled w. Ramon Dredge 04/09/21.

## 2021-04-08 NOTE — Telephone Encounter (Signed)
Nurse Assessment Nurse: Thad Ranger RN, Langley Gauss Date/Time (Eastern Time): 04/08/2021 8:18:31 AM Confirm and document reason for call. If symptomatic, describe symptoms. ---Caller states that he sent a message to Dr. Charlett Blake yesterday but thought he should call in as well. He tested positive for COVID yesterday and he didn't know if he needed to schedule a virtual visit or not. His symptoms consist of congestion, sore throat, headache, body aches and 101.2 temp. Does the patient have any new or worsening symptoms? ---Yes Will a triage be completed? ---Yes Related visit to physician within the last 2 weeks? ---No Does the PT have any chronic conditions? (i.e. diabetes, asthma, this includes High risk factors for pregnancy, etc.) ---No Is this a behavioral health or substance abuse call? ---No Guidelines Guideline Title Affirmed Question Affirmed Notes Nurse Date/Time ([place] Time) COVID-19 - Diagnosed or Suspected [1] COVID-19 diagnosed by positive lab test (e.g., PCR, rapid self-test kit) AND [2] mild symptoms Carmon, RN, Langley Gauss 04/08/2021 8:19:09 AM PLEASE NOTE: All timestamps contained within this report are represented as [place] Standard Time. CONFIDENTIALTY NOTICE: This fax transmission is intended only for the addressee. It contains information that is legally privileged, confidential or otherwise protected from use or disclosure. If you are not the intended recipient, you are strictly prohibited from reviewing, disclosing, copying using or disseminating any of this information or taking any action in reliance on or regarding this information. If you have received this fax in error, please notify us immediately by telephone so that we can arrange for its return to [place]. Phone: [phone], Toll-Free: [phone], Fax: [phone] Page: 2 of 3 Call Id: [id number] Guidelines Guideline Title Affirmed Question Affirmed Notes Nurse Date/Time Eilene Ghazi Time) (e.g., cough,  fever, others) AND [4] no complications or SOB Disp. Time Eilene Ghazi Time) Disposition Final User 04/08/2021 8:31:42 AM Home Care Yes Carmon, RN, Yevette Edwards Disagree/Comply Comply Caller Understands Yes PreDisposition Call Doctor Care Advice Given Per Guideline HOME CARE: * You should be able to treat this at home. REASSURANCE AND EDUCATION - POSITIVE COVID-19 LAB TEST AND MILD SYMPTOMS: * You had a recent lab test for COVID-19 and it came back positive. GENERAL CARE ADVICE FOR COVID-19 SYMPTOMS: * The symptoms are generally treated the same whether you have COVID-19, influenza or some other respiratory virus. * Cough: Use cough drops. * Feeling dehydrated: Drink extra liquids. If the air in your home is dry, use a humidifier. * Fever: For fever over 101 F (38.3 C), take acetaminophen every 4 to 6 hours (Adults 650 mg) OR ibuprofen every 6 to 8 hours (Adults 400 mg). Before taking any medicine, read all the instructions on the package. Do not take aspirin unless your doctor has prescribed it for you. * Muscle aches, headache, and other pains: Often this comes and goes with the fever. Take acetaminophen every 4 to 6 hours (Adults 650 mg) OR ibuprofen every 6 to 8 hours (Adults 400 mg). Before taking any medicine, read all the instructions on the package. * Sore throat: Try throat lozenges, hard candy or warm chicken broth. COUGH MEDICINES: * COUGH DROPS: Over-the-counter cough drops can help a lot, especially for mild coughs. They soothe an irritated throat and remove the tickle sensation in the back of the throat. Cough drops are easy to carry with you. * COUGH SYRUP WITH DEXTROMETHORPHAN: An over-the-counter cough syrup can help your cough. The most common cough suppressant in overthe-counter cough medicines is dextromethorphan. * HOME REMEDY - HONEY: This old home remedy has been shown to help  decrease coughing at night. The adult dosage is 2 teaspoons (10 ml) at bedtime. HUMIDIFIER: * If  the air is dry, use a humidifier in the bedroom. PAIN AND FEVER MEDICINES: * IBUPROFEN (E.G., MOTRIN, ADVIL): Take 400 mg (two 200 mg pills) by mouth every 6 hours. The most you should take is 6 pills a day (1,200 mg total). COVID-19 - HOW TO PROTECT OTHERS - WHEN YOU ARE SICK WITH COVID-19: * STAY HOME A MINIMUM OF 5 DAYS: People with MILD COVID-19 can STOP HOME ISOLATION AFTER 5 DAYS if (1) fever has been gone for 24 hours (without using fever medicine) AND (2) symptoms are better. Continue to wear a well-fitted mask for a full 10 days when around others. * WEAR A MASK FOR 10 DAYS: Wear a well-fitted mask for 10 full days any time you are around others inside your home or in public. Do not go to places where you are unable to wear a mask. * [place] HANDS OFTEN: Wash hands often with soap and water. After coughing or sneezing are important times. If soap and water are not available, use an alcohol-based hand sanitizer with at least 60% alcohol, covering all surfaces of your hands and rubbing them together until they feel dry. Avoid touching your eyes, nose, and mouth with unwashed hands. * AVOID TRAVEL: Avoid travel for 10 days after you tested positive for COVID-19. CALL BACK IF: * Fever over 103 F (39.4 C) * Chest pain or difficulty breathing occurs * You become worse CARE ADVICE given per COVID-19 - DIAGNOSED OR SUSPECTED (Adult) guideline. Comments User: Romeo Apple, RN Date/Time [place] Ghazi Time): 04/08/2021 8:31:31 AM PLEASE NOTE: All timestamps contained within this report are represented as [place] Standard Time. CONFIDENTIALTY NOTICE: This fax transmission is intended only for the addressee. It contains information that is legally privileged, confidential or otherwise protected from use or disclosure. If you are not the intended recipient, you are strictly prohibited from reviewing, disclosing, copying using or disseminating any of this information or taking any action in reliance on or  regarding this information. If you have received this fax in error, please notify us immediately by telephone so that we can arrange for its return to [place]. Phone: [phone], Toll-Free: [phone], Fax: [phone] Page: 3 of 3 Call Id: [id number] Comments Advised to wait 5 full days to retest if neg covid test needed for work. Advised to wait 48 hrs between tests if the first test remains positive

## 2021-04-09 ENCOUNTER — Telehealth (INDEPENDENT_AMBULATORY_CARE_PROVIDER_SITE_OTHER): Payer: No Typology Code available for payment source | Admitting: Medical

## 2021-04-09 ENCOUNTER — Encounter: Payer: Self-pay | Admitting: Medical

## 2021-04-09 VITALS — Temp 99.3°F

## 2021-04-09 DIAGNOSIS — R059 Cough, unspecified: Secondary | ICD-10-CM | POA: Diagnosis not present

## 2021-04-09 DIAGNOSIS — J3489 Other specified disorders of nose and nasal sinuses: Secondary | ICD-10-CM | POA: Diagnosis not present

## 2021-04-09 DIAGNOSIS — U071 COVID-19: Secondary | ICD-10-CM | POA: Diagnosis not present

## 2021-04-09 MED ORDER — BENZONATATE 100 MG PO CAPS: 100.0000 mg | ORAL_CAPSULE | Freq: Three times a day (TID) | ORAL | 0 refills | Status: DC | PRN

## 2021-04-09 MED ORDER — FLUTICASONE PROPIONATE 50 MCG/ACT NA SUSP: 2.0000 | Freq: Every day | NASAL | 3 refills | Status: DC

## 2021-04-09 MED ORDER — AZITHROMYCIN 250 MG PO TABS: ORAL_TABLET | ORAL | 0 refills | Status: AC

## 2021-04-09 NOTE — Progress Notes (Signed)
Subjective:    Patient ID: Jared Myers, male    DOB: 1980-11-01, 41 y.o.   MRN: [id number]  HPI  Virtual Visit via Telephone Note  I connected with Jared Myers on 04/09/21 at 11:20 AM EST by telephone and verified that I am speaking with the correct person using two identifiers.  Location: Patient: home Provider: virtual   I discussed the limitations, risks, security and privacy concerns of performing an evaluation and management service by telephone and the availability of in person appointments. I also discussed with the patient that there may be a patient responsible charge related to this service. The patient expressed understanding and agreed to proceed.   History of Present Illness:  Pt recently diagnosed with covid on 04-07-2021.  [facility]  History of covid vaccine x 2.  Covid risk score- 1  History of covid infection-no  Current symptoms-pt had st initially followed by fatigue, fever(101.2), body aches and chills. Pt thought it felt like the flu. Pt states his nasal congestion is getting worse.   He states throat is on fire.  Pt 02 sat-no. Mild sinus pressure.  He is coughing some at night.     Observations/Objective:  General-no acute distress, pleasant, oriented. Lungs- on inspection lungs appear unlabored. Neck- no tracheal deviation or jvd on inspection. Neuro- gross motor function appears intact.  Heent- sinus pressure frontal.  Assessment and Plan:  Patient Instructions  Covid infection in previously vaccinated patient.  Day 3 of moderate symptoms.  Prescribing benzonatate for cough and use Flonase for nasal congestion.  Can use vitamin D over-the-counter and zinc as discussed.  If having worsening symptoms indicating sinus infection or secondary lung infection can start azithromycin antibiotic.  That was sent to your pharmacy but not indicated unless you have worsening as described.  Get O2 sat monitor check O2 sats  daily.  Counseled on good O2 sat levels.  Reviewed CDC return to work guidelines/end of quarantine guidelines versus your work guidelines.  Follow-up in 7 days or sooner if needed.  Patient Instructions  Covid infection in previously vaccinated patient.  Day 3 of moderate symptoms.  Prescribing benzonatate for cough and use Flonase for nasal congestion.  Can use vitamin D over-the-counter and zinc as discussed.  If having worsening symptoms indicating sinus infection or secondary lung infection can start azithromycin antibiotic.  That was sent to your pharmacy but not indicated unless you have worsening as described.  Get O2 sat monitor check O2 sats daily.  Counseled on good O2 sat levels.  Reviewed CDC return to work guidelines/end of quarantine guidelines versus your work guidelines.  Follow-up in 7 days or sooner if needd.  Time spent with patient today was  30 minutes which consisted of chart revdiew, discussing diagnosis, work up treatment and documentation.  Extra time taken today answering patient's various questions/concerns.  Follow Up Instructions:    I discussed the assessment and treatment plan with the patient. The patient was provided an opportunity to ask questions and all were answered. The patient agreed with the plan and demonstrated an understanding of the instructions.   The patient was advised to call back or seek an in-person evaluation if the symptoms worsen or if the condition fails to improve as anticipated.  Time spent with patient today was 22 minutes which consisted of chart revdiew, discussing diagnosis, work up treatment and documentation.    Esperanza RichtersEdward Linh Hedberg, PA-C    Review of Systems  Constitutional:  Positive  for fatigue. Negative for chills and diaphoresis.  HENT:  Positive for sinus pressure and sore throat. Negative for congestion, drooling, tinnitus and voice change.   Respiratory:  Positive for cough. Negative for chest tightness, shortness  of breath and wheezing.   Cardiovascular:  Negative for chest pain and palpitations.  Gastrointestinal:  Negative for abdominal pain, blood in stool, nausea and vomiting.  Genitourinary:  Negative for dysuria, flank pain, penile pain and testicular pain.  Musculoskeletal:  Positive for myalgias. Negative for back pain, joint swelling and neck pain.  Skin:  Negative for rash.  Neurological:  Negative for dizziness, weakness, numbness and headaches.  Hematological:  Negative for adenopathy. Does not bruise/bleed easily.  Psychiatric/Behavioral:  Negative for behavioral problems, confusion and dysphoric mood. The patient is not nervous/anxious.    Past Medical History:  Diagnosis Date   Anxiety in acute stress reaction 01/06/2011   Constipation 12/18/2013   Hyperglycemia 09/28/2014   Hyperlipidemia, mixed 06/22/2014   Infectious mononucleosis 07/18/2013   Inguinal hernia 05/12/2012   Low back pain 12/16/2013   Overweight 06/22/2014   Pelvic floor dysfunction    Premature ventricular beat    Splenomegaly 07/18/2013   Tremulousness 08/14/2013     Social History   Socioeconomic History   Marital status: Married    Spouse name: Not on file   Number of children: Not on file   Years of education: Not on file   Highest education level: Not on file  Occupational History   Not on file  Tobacco Use   Smoking status: Never   Smokeless tobacco: Never  Substance and Sexual Activity   Alcohol use: No   Drug use: No   Sexual activity: Yes    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, works at post office, 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  Other Topics Concern   Not on file  Social History Narrative   New baby on the way 07/15/16   Wife is 13 weeks   Social Determinants of Corporate investment banker Strain: Not on file  Food Insecurity: Not on file  Transportation Needs: Not on file  Physical Activity: Not on file  Stress: Not on file  Social Connections: Not on file  Intimate Partner Violence:  Not on file    Past Surgical History:  Procedure Laterality Date   NO PAST SURGERIES      Family History  Problem Relation Age of Onset   Cancer Mother        Non Hodkins lymphoma, breast cancer   Other Father        pituatary adenoma   Cancer Paternal Uncle        recurrent prostate    Heart disease Maternal Grandmother        chf   Heart disease Maternal Grandfather        chf   Heart disease Paternal Grandfather        chf   Depression Brother    Anemia Sister     Allergies  Allergen Reactions   Guaifenesin     REACTION: PVCs   Prednisone     PVC    Current Outpatient Medications on File Prior to Visit  Medication Sig Dispense Refill   fluticasone (FLONASE) 50 MCG/ACT nasal spray Place 2 sprays into both nostrils daily. 48 mL 3   tiZANidine (ZANAFLEX) 4 MG tablet Take 1 tablet (4 mg total) by mouth every 8 (eight) hours as needed for muscle spasms. 60 tablet 3   No  current facility-administered medications on file prior to visit.    Temp 99.3 F (37.4 C)        Objective:   Physical Exam        Assessment & Plan:

## 2021-04-09 NOTE — Patient Instructions (Addendum)
Covid infection in previously vaccinated patient.  Day 3 of moderate symptoms.  Prescribing benzonatate for cough and use Flonase for nasal congestion.  Can use vitamin D over-the-counter and zinc as discussed.  If having worsening symptoms indicating sinus infection or secondary lung infection can start azithromycin antibiotic.  That was sent to your pharmacy but not indicated unless you have worsening as described.  Get O2 sat monitor check O2 sats daily.  Counseled on good O2 sat levels.  Reviewed CDC return to work guidelines/end of quarantine guidelines versus your work guidelines.  Follow-up in 7 days or sooner if needed.

## 2021-04-16 ENCOUNTER — Telehealth (INDEPENDENT_AMBULATORY_CARE_PROVIDER_SITE_OTHER): Payer: No Typology Code available for payment source | Admitting: Medical

## 2021-04-16 VITALS — BP 114/80 | HR 80 | Resp 20

## 2021-04-16 DIAGNOSIS — U071 COVID-19: Secondary | ICD-10-CM

## 2021-04-16 DIAGNOSIS — R5383 Other fatigue: Secondary | ICD-10-CM | POA: Diagnosis not present

## 2021-04-16 DIAGNOSIS — R059 Cough, unspecified: Secondary | ICD-10-CM | POA: Diagnosis not present

## 2021-04-16 NOTE — Progress Notes (Signed)
° °  Subjective:    Patient ID: Jared Myers, male    DOB: Mar 08, 1981, 41 y.o.   MRN: [id number]  HPI  Virtual Visit via Video Note  I connected with Jared Myers on 04/16/21 at  4:20 PM EST by a video enabled telemedicine application and verified that I am speaking with the correct person using two identifiers.  Location: Patient: home Provider: office   I discussed the limitations of evaluation and management by telemedicine and the availability of in person appointments. The patient expressed understanding and agreed to proceed.  History of Present Illness:  Follow up for covid. Pt has not ran fever since last Friday. Pt is still having significant fatigue.  Pt is testing +. He is testing since his work requires him to get negative test.  Pt tried has vit D 1000 iu dose.  He has frontal and ethmoid sinus pressure.  Getting random cough. Using less tessalon perles. Pt just finished zpack. He started that on Friday and sinus fell back.      Observations/Objective:  General-no acute distress, pleasant, oriented. Lungs- on inspection lungs appear unlabored. Neck- no tracheal deviation or jvd on inspection. Neuro- gross motor function appears intact.    Assessment and Plan:  Patient Instructions  COVID infection with a persistent significant fatigue despite rest and being about approximately 10 days post symptom onset.  Can continue Flonase nasal spray and benzonatate for residual cough.  I would recommend vitamin D 4000 international units daily.  You can continue the Zicam gummy tablet you have.  N-acetlycysteine tab 600 mg 2 tab po daily. You may be able to find at vitamin shop. Thought may help reduce covid fatigue.  If cough persisting by Friday and still feeling fatigued then recommend getting chest x-ray at med center.  Follow-up in 7 to 10 days or sooner if needed.   Time spent with patient today was 30  minutes which consisted of chart revdew,  discussing diagnosis, work up, treatment, answering question, discussing return to work time  and documentation.   Follow Up Instructions:    I discussed the assessment and treatment plan with the patient. The patient was provided an opportunity to ask questions and all were answered. The patient agreed with the plan and demonstrated an understanding of the instructions.   The patient was advised to call back or seek an in-person evaluation if the symptoms worsen or if the condition fails to improve as anticipated.  Time spent with patient today was   minutes which consisted of chart revdiew, discussing diagnosis, work up treatment and documentation.    Esperanza Richters, PA-C   Review of Systems  Constitutional:  Positive for fatigue. Negative for chills and fever.  HENT:  Positive for congestion and sinus pressure.   Respiratory:  Positive for cough. Negative for choking, shortness of breath and wheezing.   Cardiovascular:  Negative for chest pain and palpitations.  Gastrointestinal:  Negative for abdominal pain, blood in stool, nausea and vomiting.  Genitourinary:  Negative for dysuria, flank pain and frequency.  Musculoskeletal:  Negative for back pain, joint swelling and neck pain.  Skin:  Negative for rash.  Neurological:  Negative for dizziness, numbness and headaches.  Hematological:  Negative for adenopathy. Does not bruise/bleed easily.  Psychiatric/Behavioral:  Negative for behavioral problems.       Objective:   Physical Exam        Assessment & Plan:

## 2021-04-16 NOTE — Patient Instructions (Signed)
COVID infection with a persistent significant fatigue despite rest and being about approximately 10 days post symptom onset.  Can continue Flonase nasal spray and benzonatate for residual cough.  I would recommend vitamin D 4000 international units daily.  You can continue the Zicam gummy tablet you have.  N-acetlycysteine tab 600 mg 2 tab po daily. You may be able to find at vitamin shop. Thought may help reduce covid fatigue.  If cough persisting by Friday and still feeling fatigued then recommend getting chest x-ray at med center.  Follow-up in 7 to 10 days or sooner if needed.

## 2021-04-23 ENCOUNTER — Telehealth: Payer: Self-pay | Admitting: Family Medicine

## 2021-04-23 NOTE — Telephone Encounter (Signed)
Patient dropped off forms to be filled out  Placed into bin up front  Patient would like to be called when its ready to be picked up

## 2021-04-24 NOTE — Telephone Encounter (Signed)
Gave to Ramon Dredge to complete and appt for tomorrow to fill out paperwork

## 2021-04-25 ENCOUNTER — Telehealth (INDEPENDENT_AMBULATORY_CARE_PROVIDER_SITE_OTHER): Payer: No Typology Code available for payment source | Admitting: Medical

## 2021-04-25 DIAGNOSIS — R5383 Other fatigue: Secondary | ICD-10-CM

## 2021-04-25 DIAGNOSIS — U071 COVID-19: Secondary | ICD-10-CM

## 2021-04-25 NOTE — Progress Notes (Signed)
° °  Subjective:    Patient ID: Jared Myers, male    DOB: 1980-11-28, 41 y.o.   MRN: [id number]  HPI  Virtual Visit via Video Note  I connected with Jared Myers on 04/25/21 at 10:20 AM EST by a video enabled telemedicine application and verified that I am speaking with the correct person using two identifiers.  Location: Patient: home Provider: office   I discussed the limitations of evaluation and management by telemedicine and the availability of in person appointments. The patient expressed understanding and agreed to proceed.  History of Present Illness: Pt states overall he is doing better post covid but still having some gradual improving fatigue. Fatigue seems worse at end of work. He feels that NAC.  Some ethmoid sinus pressure that is faint. He did take zpack about 10 days ago.   Pt will be working a lot in near future.     Observations/Objective: General-no acute distress, pleasant, oriented. Lungs- on inspection lungs appear unlabored. Neck- no tracheal deviation or jvd on inspection. Neuro- gross motor function appears intact.   Assessment and Plan:  Patient Instructions  Patient now about 2 weeks post-COVID infection.  Feeling significantly improved except for gradually improving fatigue.  Filled out FMLA form and placing form upfront for patient to pick up.  He will mail forms himself.  Advised on slowly getting back to regular moderate exercise.  If signs and symptoms worsen or change let us know.  Follow-up as ready scheduled with PCP or as needed.   Time spent with patient today was 22  minutes which consisted of chart revdiew, discussing diagnosis, work up treatment and documentation.   Follow Up Instructions:    I discussed the assessment and treatment plan with the patient. The patient was provided an opportunity to ask questions and all were answered. The patient agreed with the plan and demonstrated an understanding of the instructions.   The  patient was advised to call back or seek an in-person evaluation if the symptoms worsen or if the condition fails to improve as anticipated.     Esperanza Richters, PA-C   Review of Systems  Constitutional:  Positive for fatigue.  HENT:  Negative for congestion, ear pain and facial swelling.   Respiratory:  Negative for cough, chest tightness, shortness of breath and wheezing.   Cardiovascular:  Negative for chest pain and palpitations.  Gastrointestinal:  Negative for abdominal pain.  Musculoskeletal:  Negative for back pain and myalgias.  Neurological:  Negative for dizziness and light-headedness.  Hematological:  Negative for adenopathy. Does not bruise/bleed easily.  Psychiatric/Behavioral:  Negative for behavioral problems and confusion.       Objective:   Physical Exam        Assessment & Plan:

## 2021-04-25 NOTE — Patient Instructions (Signed)
Patient now about 2 weeks post-COVID infection.  Feeling significantly improved except for gradually improving fatigue.  Filled out FMLA form and placing form upfront for patient to pick up.  He will mail forms himself.  Advised on slowly getting back to regular moderate exercise.  If signs and symptoms worsen or change let us know.  Follow-up as ready scheduled with PCP or as needed.

## 2021-05-01 ENCOUNTER — Other Ambulatory Visit: Payer: Self-pay

## 2021-05-01 ENCOUNTER — Ambulatory Visit (HOSPITAL_BASED_OUTPATIENT_CLINIC_OR_DEPARTMENT_OTHER)
Admission: RE | Admit: 2021-05-01 | Discharge: 2021-05-01 | Disposition: A | Discharge status: Home / Self Care | Payer: No Typology Code available for payment source | Source: Ambulatory Visit | Attending: Medical | Admitting: Medical

## 2021-05-01 DIAGNOSIS — R5383 Other fatigue: Secondary | ICD-10-CM | POA: Insufficient documentation

## 2021-05-01 DIAGNOSIS — R059 Cough, unspecified: Secondary | ICD-10-CM | POA: Diagnosis present

## 2021-05-01 DIAGNOSIS — U071 COVID-19: Secondary | ICD-10-CM | POA: Insufficient documentation

## 2021-05-01 NOTE — Telephone Encounter (Signed)
Pt scheduled 2/24 @ 340   Covid symps follow up appointment with Ramon Dredge.

## 2021-05-03 ENCOUNTER — Ambulatory Visit: Payer: No Typology Code available for payment source | Admitting: Medical

## 2021-05-03 ENCOUNTER — Other Ambulatory Visit (HOSPITAL_BASED_OUTPATIENT_CLINIC_OR_DEPARTMENT_OTHER): Payer: Self-pay

## 2021-05-03 VITALS — BP 118/78 | HR 84 | Temp 98.0°F | Resp 18 | Ht 67.0 in | Wt 195.8 lb

## 2021-05-03 DIAGNOSIS — R591 Generalized enlarged lymph nodes: Secondary | ICD-10-CM

## 2021-05-03 DIAGNOSIS — J029 Acute pharyngitis, unspecified: Secondary | ICD-10-CM | POA: Diagnosis not present

## 2021-05-03 DIAGNOSIS — H6690 Otitis media, unspecified, unspecified ear: Secondary | ICD-10-CM | POA: Diagnosis not present

## 2021-05-03 DIAGNOSIS — J3489 Other specified disorders of nose and nasal sinuses: Secondary | ICD-10-CM | POA: Diagnosis not present

## 2021-05-03 MED ORDER — AMOXICILLIN-POT CLAVULANATE 875-125 MG PO TABS
1.0000 | ORAL_TABLET | Freq: Two times a day (BID) | ORAL | 0 refills | Status: DC
  Filled 2021-05-03: qty 20, 10d supply, fill #0

## 2021-05-03 NOTE — Patient Instructions (Signed)
Resolving fatigue post covid. Continue NAC.  Recent symptoms st, sinus presssure and by exam ear infections. I think left side lymph node submandibular associate with ear or sinus infection.  Rx augmentin antibiotic for 10 days. Continue benzonatate and flonase. Cxr negative yesterday.   Follow up in 2 weeks if still fatigued or if submandibular lymph node still enlarged will get labs including cbc.

## 2021-05-03 NOTE — Progress Notes (Signed)
Subjective:    Patient ID: Jared Myers, male    DOB: 06/23/1980, 41 y.o.   MRN: [id number]  HPI  Pt in for follow post covid.   Recent cxr post covid was negative.  Pt has been working a lot. He worked 6 days in a row. He has been having fatigue but states progressively getting better. He can sit and rest at work if possible.  He still has residual cough.   He has left side of neck region lymph node that is tender.   No ear pain. Faint discomfort in throat. Also less than when he had covid.   When he takes a deep breath will cause a cough.   Review of Systems  Constitutional:  Negative for chills, fatigue and fever.  HENT:  Positive for congestion, sinus pressure and sore throat.        Faint st.  Respiratory:  Positive for cough. Negative for chest tightness, shortness of breath and wheezing.   Cardiovascular:  Negative for chest pain and palpitations.  Gastrointestinal:  Negative for abdominal pain.  Musculoskeletal:  Negative for back pain and myalgias.  Neurological:  Negative for dizziness, seizures, syncope, speech difficulty and weakness.  Hematological:  Positive for adenopathy.  Psychiatric/Behavioral:  Negative for behavioral problems and confusion.     Past Medical History:  Diagnosis Date   Anxiety in acute stress reaction 01/06/2011   Constipation 12/18/2013   Hyperglycemia 09/28/2014   Hyperlipidemia, mixed 06/22/2014   Infectious mononucleosis 07/18/2013   Inguinal hernia 05/12/2012   Low back pain 12/16/2013   Overweight 06/22/2014   Pelvic floor dysfunction    Premature ventricular beat    Splenomegaly 07/18/2013   Tremulousness 08/14/2013     Social History   Socioeconomic History   Marital status: Married    Spouse name: Not on file   Number of children: Not on file   Years of education: Not on file   Highest education level: Not on file  Occupational History   Not on file  Tobacco Use   Smoking status: Never   Smokeless tobacco: Never   Substance and Sexual Activity   Alcohol use: No   Drug use: No   Sexual activity: Yes    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, works at post office, 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  Other Topics Concern   Not on file  Social History Narrative   New baby on the way 07/15/16   Wife is 13 weeks   Social Determinants of Corporate investment banker Strain: Not on file  Food Insecurity: Not on file  Transportation Needs: Not on file  Physical Activity: Not on file  Stress: Not on file  Social Connections: Not on file  Intimate Partner Violence: Not on file    Past Surgical History:  Procedure Laterality Date   NO PAST SURGERIES      Family History  Problem Relation Age of Onset   Cancer Mother        Non Hodkins lymphoma, breast cancer   Other Father        pituatary adenoma   Cancer Paternal Uncle        recurrent prostate    Heart disease Maternal Grandmother        chf   Heart disease Maternal Grandfather        chf   Heart disease Paternal Grandfather        chf   Depression Brother    Anemia Sister  Allergies  Allergen Reactions   Guaifenesin     REACTION: PVCs   Prednisone     PVC    Current Outpatient Medications on File Prior to Visit  Medication Sig Dispense Refill   tiZANidine (ZANAFLEX) 4 MG tablet Take 1 tablet (4 mg total) by mouth every 8 (eight) hours as needed for muscle spasms. 60 tablet 3   No current facility-administered medications on file prior to visit.    BP 118/78    Pulse 84    Temp 98 F (36.7 C)    Resp 18    Ht 5\' 7"  (1.702 m)    Wt 195 lb 12.8 oz (88.8 kg)    SpO2 96%    BMI 30.67 kg/m       Objective:   Physical Exam   General Mental Status- Alert. General Appearance- Not in acute distress.   Skin General: Color- Normal Color. Moisture- Normal Moisture.  Neck Carotid Arteries- Normal color. Moisture- Normal Moisture. No carotid bruits. No JVD.  Chest and Lung Exam Auscultation: Breath  Sounds:-Normal.  Cardiovascular Auscultation:Rythm- Regular. Murmurs & Other Heart Sounds:Auscultation of the heart reveals- No Murmurs.  Abdomen Inspection:-Inspeection Normal. Palpation/Percussion:Note:No mass. Palpation and Percussion of the abdomen reveal- Non Tender, Non Distended + BS, no rebound or guarding.  Neurologic Cranial Nerve exam:- CN III-XII intact(No nystagmus), symmetric smile. Strength:- 5/5 equal and symmetric strength both upper and lower extremities.    Heent- faint sinus pressure. Both tm mild red. Faint red pharynx. Left subamandibular mild full.    Assessment & Plan:   Resolving fatigue post covid. Continue NAC.  Recent symptoms st, sinus presssure and by exam ear infections. I think left side lymph node submandibular associate with ear or sinus infection.  Rx augmentin antibiotic for 10 days. Continue benzonatate and flonase. Cxr negative yesterday.   Follow up in 2 weeks if still fatigued or if submandibular lymph node still enlarged will get labs including cbc.  Mackie Pai, PA-C

## 2021-05-20 ENCOUNTER — Other Ambulatory Visit (HOSPITAL_BASED_OUTPATIENT_CLINIC_OR_DEPARTMENT_OTHER): Payer: Self-pay

## 2021-05-20 ENCOUNTER — Encounter: Payer: Self-pay | Admitting: Medical

## 2021-05-20 ENCOUNTER — Ambulatory Visit: Payer: No Typology Code available for payment source | Admitting: Medical

## 2021-05-20 VITALS — BP 127/78 | HR 100 | Resp 18 | Ht 67.0 in | Wt 197.0 lb

## 2021-05-20 DIAGNOSIS — R5383 Other fatigue: Secondary | ICD-10-CM

## 2021-05-20 DIAGNOSIS — J0101 Acute recurrent maxillary sinusitis: Secondary | ICD-10-CM

## 2021-05-20 DIAGNOSIS — J3489 Other specified disorders of nose and nasal sinuses: Secondary | ICD-10-CM

## 2021-05-20 DIAGNOSIS — M791 Myalgia, unspecified site: Secondary | ICD-10-CM

## 2021-05-20 LAB — COMPREHENSIVE METABOLIC PANEL
ALT: 22 U/L (ref 0–53)
AST: 16 U/L (ref 0–37)
Albumin: 4.6 g/dL (ref 3.5–5.2)
Alkaline Phosphatase: 64 U/L (ref 39–117)
BUN: 12 mg/dL (ref 6–23)
CO2: 29 mEq/L (ref 19–32)
Calcium: 9.9 mg/dL (ref 8.4–10.5)
Chloride: 100 mEq/L (ref 96–112)
Creatinine, Ser: 1.04 mg/dL (ref 0.40–1.50)
GFR: 89.44 mL/min (ref >60.00)
Glucose, Bld: 84 mg/dL (ref 70–99)
Potassium: 3.9 mEq/L (ref 3.5–5.1)
Sodium: 139 mEq/L (ref 135–145)
Total Bilirubin: 0.7 mg/dL (ref 0.2–1.2)
Total Protein: 7.1 g/dL (ref 6.0–8.3)

## 2021-05-20 LAB — CBC WITH DIFFERENTIAL/PLATELET
Basophils Absolute: 0 10*3/uL (ref 0.0–0.1)
Basophils Relative: 0.5 % (ref 0.0–3.0)
Eosinophils Absolute: 0.1 10*3/uL (ref 0.0–0.7)
Eosinophils Relative: 1.7 % (ref 0.0–5.0)
HCT: 43.7 % (ref 39.0–52.0)
Hemoglobin: 15.4 g/dL (ref 13.0–17.0)
Lymphocytes Relative: 35.5 % (ref 12.0–46.0)
Lymphs Abs: 2.3 10*3/uL (ref 0.7–4.0)
MCHC: 35.3 g/dL (ref 30.0–36.0)
MCV: 87.2 fl (ref 78.0–100.0)
Monocytes Absolute: 0.5 10*3/uL (ref 0.1–1.0)
Monocytes Relative: 7.7 % (ref 3.0–12.0)
Neutro Abs: 3.6 10*3/uL (ref 1.4–7.7)
Neutrophils Relative %: 54.6 % (ref 43.0–77.0)
Platelets: 274 10*3/uL (ref 150.0–400.0)
RBC: 5.01 Mil/uL (ref 4.22–5.81)
RDW: 13.2 % (ref 11.5–15.5)
WBC: 6.5 10*3/uL (ref 4.0–10.5)

## 2021-05-20 LAB — C-REACTIVE PROTEIN: CRP: <1 mg/dL (ref 0.5–20.0)

## 2021-05-20 LAB — VITAMIN B12: Vitamin B-12: 198 pg/mL — ABNORMAL LOW (ref 211–911)

## 2021-05-20 LAB — SEDIMENTATION RATE: Sed Rate: 1 mm/hr (ref 0–15)

## 2021-05-20 MED ORDER — METHYLPREDNISOLONE ACETATE 80 MG/ML IJ SUSP
80.0000 mg | Freq: Once | INTRAMUSCULAR | Status: AC
  Administered 2021-05-20: 80 mg via INTRAMUSCULAR

## 2021-05-20 MED ORDER — CEFTRIAXONE SODIUM 1 G IJ SOLR
1.0000 g | Freq: Once | INTRAMUSCULAR | Status: AC
  Administered 2021-05-20: 1 g via INTRAMUSCULAR

## 2021-05-20 NOTE — Progress Notes (Addendum)
? ?Subjective:  ? ? Patient ID: Jared Myers, male    DOB: 02/16/1981, 41 y.o.   MRN: [id number] ? ? ?HPI ?Pt in for follow up.  ? ?"Last visit summary. ? ?Resolving fatigue post covid. Continue NAC. ?  ?Recent symptoms st, sinus pressure and by exam ear infections. I think left side lymph node submandibular associate with ear or sinus infection. ?  ?Rx augmentin antibiotic for 10 days. Continue benzonatate and flonase. ?Cxr negative yesterday. ?  ?  ?Follow up in 2 weeks if still fatigued or if submandibular lymph node still enlarged will get labs including cbc." ? ?Pt tells me his smell has been off/not back to normal since covid.  ? ?Pt saw dentist and does no  have cavities. ? ?He still feels like lymph node submandibular area still swollen.  ? ?This time of year pt does get allergies and he is being very cautious not to go outside. ? ? ?Pt has been on zpack and augmentin. Since started with covid infection. Had concerns for secondary infection. ? ?Some bilateral lower ext myaglias since covid. ? ? ? ?Review of Systems  ?Constitutional:  Negative for chills and fatigue.  ?Respiratory:  Negative for cough, chest tightness, shortness of breath and wheezing.   ?Cardiovascular:  Negative for chest pain and palpitations.  ?Gastrointestinal:  Negative for abdominal pain, anal bleeding, blood in stool and constipation.  ?Genitourinary:  Negative for dysuria.  ?Musculoskeletal:  Positive for myalgias. Negative for back pain.  ?Skin:  Negative for rash.  ?Neurological:  Negative for dizziness, speech difficulty, numbness and headaches.  ?Hematological:  Negative for adenopathy. Does not bruise/bleed easily.  ?Psychiatric/Behavioral:  Negative for confusion.   ? ?Past Medical History:  ?Diagnosis Date  ? Anxiety in acute stress reaction 01/06/2011  ? Constipation 12/18/2013  ? Hyperglycemia 09/28/2014  ? Hyperlipidemia, mixed 06/22/2014  ? Infectious mononucleosis 07/18/2013  ? Inguinal hernia 05/12/2012  ? Low back pain  12/16/2013  ? Overweight 06/22/2014  ? Pelvic floor dysfunction   ? Premature ventricular beat   ? Splenomegaly 07/18/2013  ? Tremulousness 08/14/2013  ? ?  ?Social History  ? ?Socioeconomic History  ? Marital status: Married  ?  Spouse name: Not on file  ? Number of children: Not on file  ? Years of education: Not on file  ? Highest education level: Not on file  ?Occupational History  ? Not on file  ?Tobacco Use  ? Smoking status: Never  ? Smokeless tobacco: Never  ?Substance and Sexual Activity  ? Alcohol use: No  ? Drug use: No  ? Sexual activity: Yes  ?  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 Not on file  ?Social History Narrative  ? New baby on the way 07/15/16  ? Wife is 13 weeks  ? ?Social Determinants of Health  ? ?Financial Resource Strain: Not on file  ?Food Insecurity: Not on file  ?Transportation Needs: Not on file  ?Physical Activity: Not on file  ?Stress: Not on file  ?Social Connections: Not on file  ?Intimate Partner Violence: Not on file  ? ? ?Past Surgical History:  ?Procedure Laterality Date  ? NO PAST SURGERIES    ? ? ?Family History  ?Problem Relation Age of Onset  ? Cancer Mother   ?     Non Hodkins lymphoma, breast cancer  ? Other Father   ?     pituatary adenoma  ?  Cancer Paternal Uncle   ?     recurrent prostate   ? Heart disease Maternal Grandmother   ?     chf  ? Heart disease Maternal Grandfather   ?     chf  ? Heart disease Paternal Grandfather   ?     chf  ? Depression Brother   ? Anemia Sister   ? ? ?Allergies  ?Allergen Reactions  ? Guaifenesin   ?  REACTION: PVCs  ? Prednisone   ?  PVC  ? ? ?Current Outpatient Medications on File Prior to Visit  ?Medication Sig Dispense Refill  ? tiZANidine (ZANAFLEX) 4 MG tablet Take 1 tablet (4 mg total) by mouth every 8 (eight) hours as needed for muscle spasms. 60 tablet 3  ? ?No current facility-administered medications on file prior to visit.  ? ? ?BP 127/78   Pulse 100    Resp 18   Ht 5\' 7"  (1.702 m)   Wt 197 lb (89.4 kg)   SpO2 98%   BMI 30.85 kg/m?  ?  ?   ?Objective:  ? Physical Exam ? ?General- No acute distress. Pleasant patient. ?Neck- Full range of motion, no jvd ?Lungs- Clear, even and unlabored. ?Heart- regular rate and rhythm. ?Neurologic- CNII- XII grossly intact.  ?Heent- maxillary sinus pressure. No frontal pain on palpation. Posterior pharynx- no redness. Pnd. No exudate. ? ? ? ? ?   ?Assessment & Plan:  ? ?Patient Instructions  ?Some persisting fatigue, mild sinus pressure and the left side submandibular node tenderness 6 weeks post COVID infection.  Also some lower extremity myalgia reported over the last 2 weeks as well. ? ?Recommend continuing NAC and vitamin D.  Continue Flonase as well. ? ?You have been on both Augmentin and azithromycin.  Hesitant to a third full round of oral antibiotic but did decide to give Rocephin 1 g IM injection today.  Rx advisement given. ? ?Also decided to give Depo-Medrol 80 mg IM injection. ?Patient does have some spring allergies and lower extremity muscle soreness post-COVID might respond to steroid injection as well. ? ?Asked patient to give me an update on Friday asked if sinus pressure is persisting and if lymph node is still tender.  In that event would refer to ENT. ? ?Follow-up in 10 to 14 days or sooner if needed. ? ?For further lower extremity myalgias we will get sed rate and C-reactive protein. ? ?For persisting fatigue 6 weeks post-COVID decided to go ahead and get CBC, CMP and B12 level.  ? ?Wednesday, PA-C  ?

## 2021-05-20 NOTE — Addendum Note (Signed)
Addended by: Maximino Sarin on: 05/20/2021 02:03 PM ? ? Modules accepted: Orders ? ?

## 2021-05-20 NOTE — Patient Instructions (Addendum)
Some persisting fatigue, mild sinus pressure and the left side submandibular node tenderness 6 weeks post COVID infection.  Also some lower extremity myalgia reported over the last 2 weeks as well. ? ?Recommend continuing NAC and vitamin D.  Continue Flonase as well. ? ?You have been on both Augmentin and azithromycin.  Hesitant to a third full round of oral antibiotic but did decide to give Rocephin 1 g IM injection today.  Rx advisement given. ? ?Also decided to give Depo-Medrol 80 mg IM injection. ?Patient does have some spring allergies and lower extremity muscle soreness post-COVID might respond to steroid injection as well. ? ?Asked patient to give me an update on Friday asked if sinus pressure is persisting and if lymph node is still tender.  In that event would refer to ENT. ? ?Follow-up in 10 to 14 days or sooner if needed. ? ?For further lower extremity myalgias we will get sed rate and C-reactive protein. ? ?For persisting fatigue 6 weeks post-COVID decided to go ahead and get CBC, CMP and B12 level. ?

## 2021-05-22 ENCOUNTER — Ambulatory Visit (INDEPENDENT_AMBULATORY_CARE_PROVIDER_SITE_OTHER): Payer: No Typology Code available for payment source | Admitting: Family Medicine

## 2021-05-22 DIAGNOSIS — E538 Deficiency of other specified B group vitamins: Secondary | ICD-10-CM

## 2021-05-22 DIAGNOSIS — R5383 Other fatigue: Secondary | ICD-10-CM

## 2021-05-22 MED ORDER — CYANOCOBALAMIN 1000 MCG/ML IJ SOLN
1000.0000 ug | Freq: Once | INTRAMUSCULAR | Status: AC
  Administered 2021-05-22: 1000 ug via INTRAMUSCULAR

## 2021-05-22 NOTE — Progress Notes (Addendum)
Pt here for weekly B-12 injection per, Dr. Charlett Blake   ?B-12 1000 mcg given IM, and pt tolerated well. ?Next B-12 injection scheduled for 05/28/2021 ?

## 2021-05-28 ENCOUNTER — Telehealth: Payer: Self-pay | Admitting: *Deleted

## 2021-05-28 ENCOUNTER — Ambulatory Visit (INDEPENDENT_AMBULATORY_CARE_PROVIDER_SITE_OTHER): Payer: No Typology Code available for payment source | Admitting: *Deleted

## 2021-05-28 DIAGNOSIS — E538 Deficiency of other specified B group vitamins: Secondary | ICD-10-CM

## 2021-05-28 MED ORDER — CYANOCOBALAMIN 1000 MCG/ML IJ SOLN
1000.0000 ug | Freq: Once | INTRAMUSCULAR | Status: AC
  Administered 2021-05-28: 1000 ug via INTRAMUSCULAR

## 2021-05-28 NOTE — Telephone Encounter (Signed)
Pt has been scheduled.  °

## 2021-05-28 NOTE — Telephone Encounter (Signed)
Patient needs to be scheduled next week for weekly b12. ? ?Accidentally scheduled for next month.  (That appt was cancelled). ? ?Left message on machine for patient to call back to schedule. ?

## 2021-05-28 NOTE — Progress Notes (Addendum)
Patient here for 2nd weekly b12 injection per [name] ? ?Injection given in left deltoid and patient tolerated well. ? ?Esperanza Richters, PA-C  ?

## 2021-06-04 ENCOUNTER — Ambulatory Visit (INDEPENDENT_AMBULATORY_CARE_PROVIDER_SITE_OTHER): Payer: No Typology Code available for payment source

## 2021-06-04 DIAGNOSIS — E538 Deficiency of other specified B group vitamins: Secondary | ICD-10-CM

## 2021-06-04 MED ORDER — CYANOCOBALAMIN 1000 MCG/ML IJ SOLN
1000.0000 ug | Freq: Once | INTRAMUSCULAR | Status: AC
  Administered 2021-06-04: 1000 ug via INTRAMUSCULAR

## 2021-06-04 NOTE — Progress Notes (Signed)
Patient here for 3nd weekly b12 injection per [name] ?  ?Injection given in left deltoid and patient tolerated well ?

## 2021-06-05 ENCOUNTER — Telehealth: Payer: Self-pay

## 2021-06-05 NOTE — Progress Notes (Deleted)
B12 nurse visit rescheduled. No providers in late on 4/4 or that week and clinic closed on 4/7. Pt is training for work for 3 weeks and only availability is late or on Fridays. Pt states the B12 really helps and he does not want to miss dose. Pt rescheduled for 3/31 for last weekly injection. Pt will do monthly injections going forward. ?

## 2021-06-05 NOTE — Telephone Encounter (Signed)
B12 nurse visit rescheduled. No providers in late on 4/4 or that week and clinic closed on 4/7. Pt is training for work for 3 weeks and only availability is late or on Fridays. Pt states the B12 really helps and he does not want to miss dose. Pt rescheduled for 3/31 for last weekly injection. Pt will do monthly injections going forward. ?

## 2021-06-07 ENCOUNTER — Ambulatory Visit: Payer: No Typology Code available for payment source

## 2021-06-11 ENCOUNTER — Ambulatory Visit: Payer: No Typology Code available for payment source

## 2021-06-18 ENCOUNTER — Ambulatory Visit (INDEPENDENT_AMBULATORY_CARE_PROVIDER_SITE_OTHER): Payer: No Typology Code available for payment source

## 2021-06-18 DIAGNOSIS — E538 Deficiency of other specified B group vitamins: Secondary | ICD-10-CM

## 2021-06-18 MED ORDER — CYANOCOBALAMIN 1000 MCG/ML IJ SOLN
1000.0000 ug | Freq: Once | INTRAMUSCULAR | Status: AC
  Administered 2021-06-18: 1000 ug via INTRAMUSCULAR

## 2021-06-18 NOTE — Progress Notes (Signed)
Patient here for 4th weekly b12 injection per [name] ?  ?Injection given in left deltoid and patient tolerated well. ?  ?Scheduled for the Monthly B12 on 07/11/2021. ? ?Pt reports that he has more energy and feels a difference.  ?

## 2021-07-02 ENCOUNTER — Ambulatory Visit: Payer: No Typology Code available for payment source

## 2021-07-08 ENCOUNTER — Ambulatory Visit: Payer: No Typology Code available for payment source | Admitting: Medical

## 2021-07-08 ENCOUNTER — Other Ambulatory Visit (HOSPITAL_BASED_OUTPATIENT_CLINIC_OR_DEPARTMENT_OTHER): Payer: Self-pay

## 2021-07-08 VITALS — BP 116/80 | HR 89 | Temp 98.3°F | Resp 18 | Ht 67.0 in | Wt 194.6 lb

## 2021-07-08 DIAGNOSIS — J0100 Acute maxillary sinusitis, unspecified: Secondary | ICD-10-CM

## 2021-07-08 DIAGNOSIS — J40 Bronchitis, not specified as acute or chronic: Secondary | ICD-10-CM

## 2021-07-08 MED ORDER — BENZONATATE 100 MG PO CAPS
100.0000 mg | ORAL_CAPSULE | Freq: Three times a day (TID) | ORAL | 0 refills | Status: DC | PRN
  Filled 2021-07-08: qty 30, 10d supply, fill #0

## 2021-07-08 MED ORDER — AZITHROMYCIN 250 MG PO TABS
ORAL_TABLET | ORAL | 0 refills | Status: AC
  Filled 2021-07-08: qty 6, 5d supply, fill #0

## 2021-07-08 NOTE — Progress Notes (Addendum)
? ?Subjective:  ? ? Patient ID: Jared Myers, male    DOB: 07/07/1980, 41 y.o.   MRN: [id number] ? ?HPI ?Pt states has sinus pressure recently. Pt family members have been sick with uri/sinus infection type symptoms.  Pt symptoms started on Friday.  ? ?Pt has been cleaning out attic and exposed to dust. Pt has cutting grass about one week ago.  ? ?One of his kids had green mucus from nose and getting in pt face. ? ? ?Review of Systems  ?HENT:  Positive for congestion, postnasal drip and sinus pressure.   ?Respiratory:  Positive for cough. Negative for shortness of breath and wheezing.   ?Cardiovascular:  Negative for chest pain and palpitations.  ?Gastrointestinal:  Negative for abdominal pain.  ?Musculoskeletal:  Negative for back pain and myalgias.  ?Neurological:  Negative for dizziness, weakness and headaches.  ?Hematological:  Positive for adenopathy. Does not bruise/bleed easily.  ?Psychiatric/Behavioral:  Negative for behavioral problems.   ? ? ?Past Medical History:  ?Diagnosis Date  ? Anxiety in acute stress reaction 01/06/2011  ? Constipation 12/18/2013  ? Hyperglycemia 09/28/2014  ? Hyperlipidemia, mixed 06/22/2014  ? Infectious mononucleosis 07/18/2013  ? Inguinal hernia 05/12/2012  ? Low back pain 12/16/2013  ? Overweight 06/22/2014  ? Pelvic floor dysfunction   ? Premature ventricular beat   ? Splenomegaly 07/18/2013  ? Tremulousness 08/14/2013  ? ?  ?Social History  ? ?Socioeconomic History  ? Marital status: Married  ?  Spouse name: Not on file  ? Number of children: Not on file  ? Years of education: Not on file  ? Highest education level: Not on file  ?Occupational History  ? Not on file  ?Tobacco Use  ? Smoking status: Never  ? Smokeless tobacco: Never  ?Substance and Sexual Activity  ? Alcohol use: No  ? Drug use: No  ? Sexual activity: Yes  ?  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 Not on file  ?Social History Narrative  ? New  baby on the way 07/15/16  ? Wife is 13 weeks  ? ?Social Determinants of Health  ? ?Financial Resource Strain: Not on file  ?Food Insecurity: Not on file  ?Transportation Needs: Not on file  ?Physical Activity: Not on file  ?Stress: Not on file  ?Social Connections: Not on file  ?Intimate Partner Violence: Not on file  ? ? ?Past Surgical History:  ?Procedure Laterality Date  ? NO PAST SURGERIES    ? ? ?Family History  ?Problem Relation Age of Onset  ? Cancer Mother   ?     Non Hodkins lymphoma, breast cancer  ? Other Father   ?     pituatary adenoma  ? Cancer Paternal Uncle   ?     recurrent prostate   ? Heart disease Maternal Grandmother   ?     chf  ? Heart disease Maternal Grandfather   ?     chf  ? Heart disease Paternal Grandfather   ?     chf  ? Depression Brother   ? Anemia Sister   ? ? ?Allergies  ?Allergen Reactions  ? Guaifenesin   ?  REACTION: PVCs  ? Prednisone   ?  PVC  ? ? ?Current Outpatient Medications on File Prior to Visit  ?Medication Sig Dispense Refill  ? tiZANidine (ZANAFLEX) 4 MG tablet Take 1 tablet (4 mg total) by mouth every 8 (  eight) hours as needed for muscle spasms. 60 tablet 3  ? ?No current facility-administered medications on file prior to visit.  ? ? ?BP 116/80   Pulse 89   Temp 98.3 ?F (36.8 ?C)   Resp 18   Ht 5\' 7"  (1.702 m)   Wt 194 lb 9.6 oz (88.3 kg)   SpO2 98%   BMI 30.48 kg/m?  ?  ?   ?Objective:  ? Physical Exam ? ?General- No acute distress. Pleasant patient. ?Neck- Full range of motion, no jvd ?Lungs- Clear, even and unlabored. ?Heart- regular rate and rhythm. ?Neurologic- CNII- XII grossly intact.  ? ?Heent-maxillary and frontal sinus pressure. Ears- canals clear and normal tms. ?No obvious lymphadenopathy. On exam pt still thinks left submandibular mild swollen. On palpation faint at best. ?   ? ? ?Assessment & Plan:  ? ?Patient Instructions  ? After dust exposure cleaning attic. You appear to have bronchitis and sinusitis(more sinus infection symptoms). Rest hydrate  and tylenol for fever. Use your  flonase nasal spray,  cough medicine benzonatate and  azithromycin antibiotic. ? ?You should gradually get better. If not then notify and would recommend a chest xray. ? ?Follow up in 7-10 days if symptoms persist or sooner if needed.  ? ?9-10, PA-C  ?

## 2021-07-08 NOTE — Patient Instructions (Addendum)
After dust exposure cleaning attic. You appear to have bronchitis and sinusitis(more sinus infection symptoms). Rest hydrate and tylenol for fever. Use your  flonase nasal spray,  cough medicine benzonatate and  azithromycin antibiotic. ? ?You should gradually get better. If not then notify us and would recommend a chest xray. ? ?Follow up in 7-10 days if symptoms persist or sooner if needed. ?

## 2021-07-11 ENCOUNTER — Ambulatory Visit (INDEPENDENT_AMBULATORY_CARE_PROVIDER_SITE_OTHER): Payer: No Typology Code available for payment source

## 2021-07-11 DIAGNOSIS — E538 Deficiency of other specified B group vitamins: Secondary | ICD-10-CM | POA: Diagnosis not present

## 2021-07-11 MED ORDER — CYANOCOBALAMIN 1000 MCG/ML IJ SOLN
1000.0000 ug | Freq: Once | INTRAMUSCULAR | Status: AC
  Administered 2021-07-11: 1000 ug via INTRAMUSCULAR

## 2021-07-11 NOTE — Progress Notes (Signed)
Patient here for Monthly b12 injection per [name] ?  ?Injection given in left deltoid and patient tolerated well. ?  ?Scheduled for the Monthly B12 on 08/13/2021. ?  ?Pt reports that he has more energy and feels a difference.  ?

## 2021-07-11 NOTE — Addendum Note (Signed)
Addended by: Kittie Plater, Lakeyn Dokken HUA on: 07/11/2021 11:40 AM ? ? Modules accepted: Orders ? ?

## 2021-08-13 ENCOUNTER — Ambulatory Visit: Payer: No Typology Code available for payment source

## 2021-08-14 ENCOUNTER — Ambulatory Visit (INDEPENDENT_AMBULATORY_CARE_PROVIDER_SITE_OTHER): Payer: No Typology Code available for payment source

## 2021-08-14 DIAGNOSIS — E538 Deficiency of other specified B group vitamins: Secondary | ICD-10-CM | POA: Diagnosis not present

## 2021-08-14 MED ORDER — CYANOCOBALAMIN 1000 MCG/ML IJ SOLN
1000.0000 ug | Freq: Once | INTRAMUSCULAR | Status: AC
  Administered 2021-08-14: 1000 ug via INTRAMUSCULAR

## 2021-08-14 NOTE — Progress Notes (Addendum)
Patient here for Monthly b12 injection per Mackie Pai PA-C  Injection given in left deltoid and patient tolerated well.  Scheduled for the Monthly B12 on 08/13/2021.  Pt reports that he has more energy and feels a difference  [employer], PA-C

## 2021-09-11 ENCOUNTER — Ambulatory Visit (INDEPENDENT_AMBULATORY_CARE_PROVIDER_SITE_OTHER): Payer: No Typology Code available for payment source

## 2021-09-11 DIAGNOSIS — E538 Deficiency of other specified B group vitamins: Secondary | ICD-10-CM | POA: Diagnosis not present

## 2021-09-11 MED ORDER — CYANOCOBALAMIN 1000 MCG/ML IJ SOLN
1000.0000 ug | Freq: Once | INTRAMUSCULAR | Status: AC
  Administered 2021-09-11: 1000 ug via INTRAMUSCULAR

## 2021-09-11 NOTE — Progress Notes (Signed)
Patient here for Monthly b12 injection per [name]  Injection given in left deltoid and patient tolerated well.  Scheduled for the #3  Monthly B12 on 08/13/2021.  Pt reports that he has more energy and feels a difference

## 2021-10-09 ENCOUNTER — Ambulatory Visit (INDEPENDENT_AMBULATORY_CARE_PROVIDER_SITE_OTHER): Payer: No Typology Code available for payment source

## 2021-10-09 DIAGNOSIS — E538 Deficiency of other specified B group vitamins: Secondary | ICD-10-CM

## 2021-10-09 MED ORDER — CYANOCOBALAMIN 1000 MCG/ML IJ SOLN
1000.0000 ug | Freq: Once | INTRAMUSCULAR | Status: AC
  Administered 2021-10-09: 1000 ug via INTRAMUSCULAR

## 2021-10-09 NOTE — Progress Notes (Signed)
Jared Myers is a 41 y.o. male  presents to the office today for a B12 injection, per physician's orders. Original order: per Bradd Canary, MD  cyanocobalamin, 1000 mg/ml IM was administered in right deltoid today. This is monthly B12 4/5.  Patient tolerated injection well.   Next appointment: 11/12/21

## 2021-11-12 ENCOUNTER — Ambulatory Visit (INDEPENDENT_AMBULATORY_CARE_PROVIDER_SITE_OTHER): Payer: No Typology Code available for payment source

## 2021-11-12 DIAGNOSIS — E538 Deficiency of other specified B group vitamins: Secondary | ICD-10-CM | POA: Diagnosis not present

## 2021-11-12 MED ORDER — CYANOCOBALAMIN 1000 MCG/ML IJ SOLN
1000.0000 ug | Freq: Once | INTRAMUSCULAR | Status: AC
  Administered 2021-11-12: 1000 ug via INTRAMUSCULAR

## 2021-11-12 NOTE — Progress Notes (Addendum)
Pt here for monthly B12 injection per Esperanza Richters, PA, 4 of 5 monthly.  B12 given IM, and pt tolerated injection well.  Next B12 injection scheduled for one month.    Esperanza Richters, PA-C

## 2021-12-04 ENCOUNTER — Ambulatory Visit: Payer: No Typology Code available for payment source | Admitting: Family Medicine

## 2021-12-04 ENCOUNTER — Encounter: Payer: Self-pay | Admitting: Family Medicine

## 2021-12-04 VITALS — BP 120/72 | HR 80 | Temp 98.1°F | Resp 18 | Ht 67.0 in | Wt 194.4 lb

## 2021-12-04 DIAGNOSIS — R5383 Other fatigue: Secondary | ICD-10-CM | POA: Diagnosis not present

## 2021-12-04 NOTE — Patient Instructions (Addendum)
Give [place] 2-3 business days to get the results of your labs back.   If labs are normal, we will pursue a sleep specialist to rule out/in sleep apnea.   Try to get 6-7 hrs of sleep nightly.   Keep the diet clean and stay active.  Aim to do some physical exertion for 150 minutes per week. This is typically divided into 5 days per week, 30 minutes per day. The activity should be enough to get your heart rate up. Anything is better than nothing if you have time constraints.  Let us know if you need anything.  Please consider counseling. Contact [phone] to schedule an appointment or inquire about cost/insurance coverage.  [facility] located at [place], [place], [place].  Phone number = [phone].  Dr. Lennice Sites - Adult Psychiatry.    [facility] located at [place], [place], [place]. Phone number = [phone].   [facility] located at [address], [place], [place].  Phone number = [phone].   [facility] located at [place], [place], [place].  Phone number = [phone].

## 2021-12-04 NOTE — Progress Notes (Signed)
Chief Complaint  Patient presents with   Sore Throat    On set 2 weeks  Drained    Subjective: Patient is a 41 y.o. male here for fatigue.  Since having covid earlier in the year, he has had issues with fatigue. He has both sleepiness and low energy. Gets 6-7 hrs of sleep nightly. Diet is OK, improving. Has not been exercising. No unexplained weight changes. Has been more anxious due to these health issues. Mom has OSA.  He was on to have low vitamin B12 earlier in the year.  He was taking injections which initially helped but then leveled off and now he is having the same issue.  Past Medical History:  Diagnosis Date   Anxiety in acute stress reaction 01/06/2011   Constipation 12/18/2013   Hyperglycemia 09/28/2014   Hyperlipidemia, mixed 06/22/2014   Infectious mononucleosis 07/18/2013   Inguinal hernia 05/12/2012   Low back pain 12/16/2013   Overweight 06/22/2014   Pelvic floor dysfunction    Premature ventricular beat    Splenomegaly 07/18/2013   Tremulousness 08/14/2013    Objective: BP 120/72 (BP Location: Left Arm, Patient Position: Sitting, Cuff Size: Large)   Pulse 80   Temp 98.1 F (36.7 C) (Oral)   Resp 18   Ht 5\' 7"  (1.702 m)   Wt 194 lb 6.4 oz (88.2 kg)   SpO2 99%   BMI 30.45 kg/m  General: Awake, appears stated age Heart: RRR, no LE edema Lungs: CTAB, no rales, wheezes or rhonchi. No accessory muscle use Neuro: 5/5 strength throughout, DTRs equal and symmetric throughout, no clonus, no cerebellar signs Neck: No thyromegaly or asymmetry Mouth: MMM Psych: Age appropriate judgment and insight, normal affect and mood  Assessment and Plan: Fatigue, unspecified type - Plan: CBC, Comprehensive metabolic panel, VITAMIN D 25 Hydroxy (Vit-D Deficiency, Fractures), TSH, T4, free, B12  Check above labs.  If normal, will refer him to the sleep team for possible sleep apnea.  Counseling information provided in his paperwork though it seemed fatigue preceded mental stress.   Counseled on diet and exercise.  I do think he needs to get physically active again.  Aim for 7-9 hours of sleep nightly.  Follow-up with regular PCP as originally scheduled. The patient voiced understanding and agreement to the plan.  [place]ison [place], DO 12/04/21  4:09 PM

## 2021-12-05 ENCOUNTER — Telehealth: Payer: Self-pay | Admitting: Family Medicine

## 2021-12-05 ENCOUNTER — Other Ambulatory Visit: Payer: Self-pay | Admitting: Family Medicine

## 2021-12-05 DIAGNOSIS — R5383 Other fatigue: Secondary | ICD-10-CM

## 2021-12-05 DIAGNOSIS — R0683 Snoring: Secondary | ICD-10-CM

## 2021-12-05 LAB — TSH: TSH: 1.11 u[IU]/mL (ref 0.35–5.50)

## 2021-12-05 LAB — COMPREHENSIVE METABOLIC PANEL
ALT: 17 U/L (ref 0–53)
AST: 14 U/L (ref 0–37)
Albumin: 4.6 g/dL (ref 3.5–5.2)
Alkaline Phosphatase: 61 U/L (ref 39–117)
BUN: 13 mg/dL (ref 6–23)
CO2: 30 mEq/L (ref 19–32)
Calcium: 9.9 mg/dL (ref 8.4–10.5)
Chloride: 99 mEq/L (ref 96–112)
Creatinine, Ser: 1.07 mg/dL (ref 0.40–1.50)
GFR: 86.1 mL/min (ref >60.00)
Glucose, Bld: 92 mg/dL (ref 70–99)
Potassium: 4 mEq/L (ref 3.5–5.1)
Sodium: 137 mEq/L (ref 135–145)
Total Bilirubin: 0.8 mg/dL (ref 0.2–1.2)
Total Protein: 7.4 g/dL (ref 6.0–8.3)

## 2021-12-05 LAB — CBC
HCT: 43.4 % (ref 39.0–52.0)
Hemoglobin: 15.3 g/dL (ref 13.0–17.0)
MCHC: 35.3 g/dL (ref 30.0–36.0)
MCV: 86.4 fl (ref 78.0–100.0)
Platelets: 281 10*3/uL (ref 150.0–400.0)
RBC: 5.02 Mil/uL (ref 4.22–5.81)
RDW: 13.2 % (ref 11.5–15.5)
WBC: 7.1 10*3/uL (ref 4.0–10.5)

## 2021-12-05 LAB — VITAMIN D 25 HYDROXY (VIT D DEFICIENCY, FRACTURES): VITD: 26.64 ng/mL — ABNORMAL LOW (ref 30.00–100.00)

## 2021-12-05 LAB — T4, FREE: Free T4: 0.89 ng/dL (ref 0.60–1.60)

## 2021-12-05 LAB — VITAMIN B12: Vitamin B-12: 376 pg/mL (ref 211–911)

## 2021-12-12 ENCOUNTER — Ambulatory Visit (INDEPENDENT_AMBULATORY_CARE_PROVIDER_SITE_OTHER): Payer: No Typology Code available for payment source

## 2021-12-12 DIAGNOSIS — E538 Deficiency of other specified B group vitamins: Secondary | ICD-10-CM

## 2021-12-12 MED ORDER — CYANOCOBALAMIN 1000 MCG/ML IJ SOLN
1000.0000 ug | Freq: Once | INTRAMUSCULAR | Status: AC
  Administered 2021-12-12: 1000 ug via INTRAMUSCULAR

## 2021-12-12 NOTE — Addendum Note (Signed)
Addended by: Kittie Plater, Sheilyn Boehlke HUA on: 12/12/2021 09:52 AM   Modules accepted: Orders

## 2021-12-12 NOTE — Progress Notes (Signed)
Pt here for monthly B12 injection per Mackie Pai, PA, 5 of 5 monthly.   B12 1000 mcg given IM, and pt tolerated injection well.   Next B12 injection scheduled for one month.      Mackie Pai, PA-C

## 2021-12-26 ENCOUNTER — Other Ambulatory Visit (HOSPITAL_BASED_OUTPATIENT_CLINIC_OR_DEPARTMENT_OTHER): Payer: Self-pay

## 2021-12-26 ENCOUNTER — Ambulatory Visit: Payer: No Typology Code available for payment source | Admitting: Medical

## 2021-12-26 VITALS — BP 130/80 | HR 88 | Temp 98.4°F | Resp 18 | Ht 67.0 in | Wt 197.0 lb

## 2021-12-26 DIAGNOSIS — E538 Deficiency of other specified B group vitamins: Secondary | ICD-10-CM | POA: Diagnosis not present

## 2021-12-26 DIAGNOSIS — R0981 Nasal congestion: Secondary | ICD-10-CM

## 2021-12-26 DIAGNOSIS — R0982 Postnasal drip: Secondary | ICD-10-CM

## 2021-12-26 DIAGNOSIS — R059 Cough, unspecified: Secondary | ICD-10-CM | POA: Diagnosis not present

## 2021-12-26 MED ORDER — BENZONATATE 100 MG PO CAPS
100.0000 mg | ORAL_CAPSULE | Freq: Three times a day (TID) | ORAL | 0 refills | Status: DC | PRN
  Filled 2021-12-26: qty 30, 10d supply, fill #0

## 2021-12-26 MED ORDER — AZITHROMYCIN 250 MG PO TABS
ORAL_TABLET | ORAL | 0 refills | Status: DC
  Filled 2021-12-26: qty 6, 5d supply, fill #0

## 2021-12-26 MED ORDER — LEVOCETIRIZINE DIHYDROCHLORIDE 5 MG PO TABS
5.0000 mg | ORAL_TABLET | Freq: Every evening | ORAL | 3 refills | Status: DC
  Filled 2021-12-26: qty 30, 30d supply, fill #0

## 2021-12-26 NOTE — Progress Notes (Signed)
Subjective:    Patient ID: Jared Myers, male    DOB: 1980-12-10, 41 y.o.   MRN: [id number]  HPI  Pt in for recent mild nasal congestion, st and some pnd.  Pt states symptoms are worse now. Pt has some yellow mucus when he blows nose. He tested negative last week.  He states 2 children and his wife are both sick. All 3 have nasal congestion and pnd. Provider who saw wife thinks maybe viral. None of family have covid.  Pt is using flonase    Saw Dr. Nani Ravens and he addressed more fatigue and possible sleep apnea.  B12 level was in normal range.  Vit d level was low. Pt told to take vit d otc 1000 iu daily.  Pt was also referred for sleep study.       Review of Systems  Constitutional:  Negative for chills, fatigue and fever.  HENT:  Positive for congestion, postnasal drip and sore throat.   Respiratory:  Negative for chest tightness, shortness of breath, wheezing and stridor.   Cardiovascular:  Negative for chest pain and palpitations.  Gastrointestinal:  Negative for abdominal pain.  Musculoskeletal:  Negative for back pain, joint swelling, myalgias and neck pain.  Neurological:  Negative for dizziness, light-headedness and headaches.  Hematological:  Negative for adenopathy. Does not bruise/bleed easily.  Psychiatric/Behavioral:  Negative for behavioral problems and confusion.    Past Medical History:  Diagnosis Date   Anxiety in acute stress reaction 01/06/2011   Constipation 12/18/2013   Hyperglycemia 09/28/2014   Hyperlipidemia, mixed 06/22/2014   Infectious mononucleosis 07/18/2013   Inguinal hernia 05/12/2012   Low back pain 12/16/2013   Overweight 06/22/2014   Pelvic floor dysfunction    Premature ventricular beat    Splenomegaly 07/18/2013   Tremulousness 08/14/2013     Social History   Socioeconomic History   Marital status: Married    Spouse name: Not on file   Number of children: Not on file   Years of education: Not on file   Highest education  level: Not on file  Occupational History   Not on file  Tobacco Use   Smoking status: Never   Smokeless tobacco: Never  Substance and Sexual Activity   Alcohol use: No   Drug use: No   Sexual activity: Yes    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, works at post office, 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  Other Topics Concern   Not on file  Social History Narrative   New baby on the way 07/15/16   Wife is 13 weeks   Social Determinants of Radio broadcast assistant Strain: Not on file  Food Insecurity: Not on file  Transportation Needs: Not on file  Physical Activity: Not on file  Stress: Not on file  Social Connections: Not on file  Intimate Partner Violence: Not on file    Past Surgical History:  Procedure Laterality Date   NO PAST SURGERIES      Family History  Problem Relation Age of Onset   Cancer Mother        Non Hodkins lymphoma, breast cancer   Other Father        pituatary adenoma   Cancer Paternal Uncle        recurrent prostate    Heart disease Maternal Grandmother        chf   Heart disease Maternal Grandfather        chf   Heart disease Paternal Grandfather  chf   Depression Brother    Anemia Sister     Allergies  Allergen Reactions   Guaifenesin     REACTION: PVCs   Prednisone     PVC    Current Outpatient Medications on File Prior to Visit  Medication Sig Dispense Refill   tiZANidine (ZANAFLEX) 4 MG tablet Take 1 tablet (4 mg total) by mouth every 8 (eight) hours as needed for muscle spasms. 60 tablet 3   No current facility-administered medications on file prior to visit.    BP 130/80   Pulse 88   Temp 98.4 F (36.9 C)   Resp 18   Ht 5\' 7"  (1.702 m)   Wt 197 lb (89.4 kg)   SpO2 97%   BMI 30.85 kg/m        Objective:   Physical Exam  General- No acute distress. Pleasant patient. Neck- Full range of motion, no jvd Lungs- Clear, even and unlabored. Heart- regular rate and rhythm. Neurologic- CNII- XII grossly intact.    Heent- no sinus pressure. Boggy turbinates. +pnd. No tonsil hypertrophy. Canals clear. Normal tm.      Assessment & Plan:   Patient Instructions  Nasal congestion,  mild st, pnd and cough. Family recently sick. Your symptom appear uri vs allergies. Recommend stay on flonase, add xyzal and rx benzonatate for cough.  Recommend start the above but if symptoms persist by Friday then add on azithromycin antibiotic.  Start vit D. Do recommend you follow thru with sleep study as part of work up for fatigue.  Follow up as regularly scheduled with pcp or sooner if needed       [employer], PA-C

## 2021-12-26 NOTE — Patient Instructions (Addendum)
Nasal congestion,  mild st, pnd and cough. Family recently sick. Your symptom appear uri vs allergies. Recommend stay on flonase, add xyzal and rx benzonatate for cough.  Recommend start the above but if symptoms persist by Friday then add on azithromycin antibiotic.  Start vit D. Do recommend you follow thru with sleep study as part of work up for fatigue.  For low b12 can get b12 5000 mcg daily dose. Will put future b12 level to be done in 2 months.  Follow up as regularly scheduled with pcp or sooner if needed

## 2021-12-29 NOTE — Assessment & Plan Note (Signed)
hgba1c acceptable, minimize simple carbs. Increase exercise as tolerated.  

## 2021-12-29 NOTE — Assessment & Plan Note (Signed)
Encourage heart healthy diet such as MIND or DASH diet, increase exercise, avoid trans fats, simple carbohydrates and processed foods, consider a krill or fish or flaxseed oil cap daily.  °

## 2021-12-29 NOTE — Progress Notes (Signed)
Subjective:    Patient ID: Jared Myers, male    DOB: 1980/12/02, 41 y.o.   MRN: [id number]  No chief complaint on file.   HPI Patient is in today for annual preventative exam and follow up on chronic medical concerns. No recent febrile illness or acute hospitalizations. He continues to work for [employer] and is busy with his wife and 2 kids. He tries to eat well and stays active. Denies CP/palp/SOB/HA/congestion/fevers/GI or GU c/o. Taking meds as prescribed   Past Medical History:  Diagnosis Date   Anxiety in acute stress reaction 01/06/2011   Constipation 12/18/2013   Hyperglycemia 09/28/2014   Hyperlipidemia, mixed 06/22/2014   Infectious mononucleosis 07/18/2013   Inguinal hernia 05/12/2012   Low back pain 12/16/2013   Overweight 06/22/2014   Pelvic floor dysfunction    Premature ventricular beat    Splenomegaly 07/18/2013   Tremulousness 08/14/2013    Past Surgical History:  Procedure Laterality Date   NO PAST SURGERIES      Family History  Problem Relation Age of Onset   Cancer Mother        Non Hodkins lymphoma, breast cancer   Other Father        pituatary adenoma   Cancer Paternal Uncle        recurrent prostate    Heart disease Maternal Grandmother        chf   Heart disease Maternal Grandfather        chf   Heart disease Paternal Grandfather        chf   Depression Brother    Anemia Sister     Social History   Socioeconomic History   Marital status: Married    Spouse name: Not on file   Number of children: Not on file   Years of education: Not on file   Highest education level: Not on file  Occupational History   Not on file  Tobacco Use   Smoking status: Never   Smokeless tobacco: Never  Substance and Sexual Activity   Alcohol use: No   Drug use: No   Sexual activity: Yes    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, works at post office, 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  Other Topics Concern   Not on file  Social History Narrative   New baby on  the way 07/15/16   Wife is 13 weeks   Social Determinants of Radio broadcast assistant Strain: Not on file  Food Insecurity: Not on file  Transportation Needs: Not on file  Physical Activity: Not on file  Stress: Not on file  Social Connections: Not on file  Intimate Partner Violence: Not on file    Outpatient Medications Prior to Visit  Medication Sig Dispense Refill   azithromycin (ZITHROMAX) 250 MG tablet Take 2 tablets by mouth on day 1, then 1 tablet daily on days 2 through 5 6 tablet 0   benzonatate (TESSALON) 100 MG capsule Take 1 capsule (100 mg total) by mouth 3 (three) times daily as needed for cough. 30 capsule 0   levocetirizine (XYZAL) 5 MG tablet Take 1 tablet (5 mg total) by mouth every evening. 30 tablet 3   tiZANidine (ZANAFLEX) 4 MG tablet Take 1 tablet (4 mg total) by mouth every 8 (eight) hours as needed for muscle spasms. 60 tablet 3   No facility-administered medications prior to visit.    Allergies  Allergen Reactions   Guaifenesin     REACTION: PVCs  Prednisone     PVC    Review of Systems  Constitutional:  Negative for chills, fever and malaise/fatigue.  HENT:  Negative for congestion and hearing loss.   Eyes:  Negative for discharge.  Respiratory:  Negative for cough, sputum production and shortness of breath.   Cardiovascular:  Negative for chest pain, palpitations and leg swelling.  Gastrointestinal:  Negative for abdominal pain, blood in stool, constipation, diarrhea, heartburn, nausea and vomiting.  Genitourinary:  Negative for dysuria, frequency, hematuria and urgency.  Musculoskeletal:  Negative for back pain, falls and myalgias.  Skin:  Negative for rash.  Neurological:  Negative for dizziness, sensory change, loss of consciousness, weakness and headaches.  Endo/Heme/Allergies:  Negative for environmental allergies. Does not bruise/bleed easily.  Psychiatric/Behavioral:  Negative for depression and suicidal ideas. The patient is not  nervous/anxious and does not have insomnia.        Objective:    Physical Exam Constitutional:      General: He is not in acute distress.    Appearance: Normal appearance. He is not ill-appearing or toxic-appearing.  HENT:     Head: Normocephalic and atraumatic.     Right Ear: External ear normal.     Left Ear: External ear normal.     Nose: Nose normal.     Mouth/Throat:     Mouth: Mucous membranes are moist.     Pharynx: Oropharynx is clear.  Eyes:     General:        Right eye: No discharge.        Left eye: No discharge.     Extraocular Movements: Extraocular movements intact.     Pupils: Pupils are equal, round, and reactive to light.  Cardiovascular:     Rate and Rhythm: Normal rate and regular rhythm.     Heart sounds: Normal heart sounds.  Pulmonary:     Effort: Pulmonary effort is normal.     Breath sounds: No wheezing.  Abdominal:     Palpations: Abdomen is soft. There is no mass.     Tenderness: There is no abdominal tenderness. There is no guarding.  Musculoskeletal:     Right lower leg: No edema.     Left lower leg: No edema.  Skin:    Findings: No rash.  Neurological:     Mental Status: He is alert and oriented to person, place, and time.  Psychiatric:        Behavior: Behavior normal.     There were no vitals taken for this visit. Wt Readings from Last 3 Encounters:  12/26/21 197 lb (89.4 kg)  12/04/21 194 lb 6.4 oz (88.2 kg)  07/08/21 194 lb 9.6 oz (88.3 kg)    Diabetic Foot Exam - Simple   No data filed    Lab Results  Component Value Date   WBC 7.1 12/04/2021   HGB 15.3 12/04/2021   HCT 43.4 12/04/2021   PLT 281.0 12/04/2021   GLUCOSE 92 12/04/2021   CHOL 174 12/24/2020   TRIG 117.0 12/24/2020   HDL 38.50 (L) 12/24/2020   LDLDIRECT 120.0 07/23/2020   LDLCALC 112 (H) 12/24/2020   ALT 17 12/04/2021   AST 14 12/04/2021   NA 137 12/04/2021   K 4.0 12/04/2021   CL 99 12/04/2021   CREATININE 1.07 12/04/2021   BUN 13 12/04/2021    CO2 30 12/04/2021   TSH 1.11 12/04/2021   PSA 0.52 01/24/2014   HGBA1C 5.1 12/24/2020    Lab Results  Component Value  Date   TSH 1.11 12/04/2021   Lab Results  Component Value Date   WBC 7.1 12/04/2021   HGB 15.3 12/04/2021   HCT 43.4 12/04/2021   MCV 86.4 12/04/2021   PLT 281.0 12/04/2021   Lab Results  Component Value Date   NA 137 12/04/2021   K 4.0 12/04/2021   CO2 30 12/04/2021   GLUCOSE 92 12/04/2021   BUN 13 12/04/2021   CREATININE 1.07 12/04/2021   BILITOT 0.8 12/04/2021   ALKPHOS 61 12/04/2021   AST 14 12/04/2021   ALT 17 12/04/2021   PROT 7.4 12/04/2021   ALBUMIN 4.6 12/04/2021   CALCIUM 9.9 12/04/2021   ANIONGAP 7 06/06/2014   GFR 86.10 12/04/2021   Lab Results  Component Value Date   CHOL 174 12/24/2020   Lab Results  Component Value Date   HDL 38.50 (L) 12/24/2020   Lab Results  Component Value Date   LDLCALC 112 (H) 12/24/2020   Lab Results  Component Value Date   TRIG 117.0 12/24/2020   Lab Results  Component Value Date   CHOLHDL 5 12/24/2020   Lab Results  Component Value Date   HGBA1C 5.1 12/24/2020       Assessment & Plan:   Problem List Items Addressed This Visit     Preventative health care    Patient encouraged to maintain heart healthy diet, regular exercise, adequate sleep. Consider daily probiotics. Take medications as prescribed. Labs ordered and reviewed. immunizations reviewed      Hyperlipidemia, mixed    Encourage heart healthy diet such as MIND or DASH diet, increase exercise, avoid trans fats, simple carbohydrates and processed foods, consider a krill or fish or flaxseed oil cap daily.       Hyperglycemia    hgba1c acceptable, minimize simple carbs. Increase exercise as tolerated.        I am having [name] maintain his tiZANidine, levocetirizine, benzonatate, and azithromycin.  No orders of the defined types were placed in this encounter.    Danise Edge, MD

## 2021-12-29 NOTE — Assessment & Plan Note (Signed)
Patient encouraged to maintain heart healthy diet, regular exercise, adequate sleep. Consider daily probiotics. Take medications as prescribed. Labs ordered and reviewed. immunizations reviewed

## 2021-12-30 ENCOUNTER — Encounter: Payer: Self-pay | Admitting: Family Medicine

## 2021-12-30 ENCOUNTER — Ambulatory Visit (INDEPENDENT_AMBULATORY_CARE_PROVIDER_SITE_OTHER): Payer: No Typology Code available for payment source | Admitting: Family Medicine

## 2021-12-30 ENCOUNTER — Other Ambulatory Visit (HOSPITAL_BASED_OUTPATIENT_CLINIC_OR_DEPARTMENT_OTHER): Payer: Self-pay

## 2021-12-30 VITALS — BP 128/74 | HR 74 | Temp 98.0°F | Resp 16 | Ht 68.0 in | Wt 197.8 lb

## 2021-12-30 DIAGNOSIS — Z0000 Encounter for general adult medical examination without abnormal findings: Secondary | ICD-10-CM

## 2021-12-30 DIAGNOSIS — R052 Subacute cough: Secondary | ICD-10-CM | POA: Diagnosis not present

## 2021-12-30 DIAGNOSIS — Z23 Encounter for immunization: Secondary | ICD-10-CM | POA: Diagnosis not present

## 2021-12-30 DIAGNOSIS — R739 Hyperglycemia, unspecified: Secondary | ICD-10-CM | POA: Diagnosis not present

## 2021-12-30 DIAGNOSIS — E538 Deficiency of other specified B group vitamins: Secondary | ICD-10-CM

## 2021-12-30 DIAGNOSIS — E782 Mixed hyperlipidemia: Secondary | ICD-10-CM

## 2021-12-30 DIAGNOSIS — R059 Cough, unspecified: Secondary | ICD-10-CM | POA: Insufficient documentation

## 2021-12-30 DIAGNOSIS — E559 Vitamin D deficiency, unspecified: Secondary | ICD-10-CM

## 2021-12-30 MED ORDER — TIZANIDINE HCL 4 MG PO TABS
4.0000 mg | ORAL_TABLET | Freq: Three times a day (TID) | ORAL | 3 refills | Status: DC | PRN
  Filled 2021-12-30: qty 60, 20d supply, fill #0

## 2021-12-30 NOTE — Assessment & Plan Note (Signed)
Supplement and monitor 

## 2021-12-30 NOTE — Assessment & Plan Note (Signed)
Supplement and monitor. Check Intrinsic factor

## 2021-12-30 NOTE — Patient Instructions (Signed)
Vitamin D3 2000 IU daily Vitamin B12 sublingual (under tongue) 1000 mcg daily  Preventive Care 30-41 Years Old, Male Preventive care refers to lifestyle choices and visits with your health care provider that can promote health and wellness. Preventive care visits are also called wellness exams. What can I expect for my preventive care visit? Counseling During your preventive care visit, your health care provider may ask about your: Medical history, including: Past medical problems. Family medical history. Current health, including: Emotional well-being. Home life and relationship well-being. Sexual activity. Lifestyle, including: Alcohol, nicotine or tobacco, and drug use. Access to firearms. Diet, exercise, and sleep habits. Safety issues such as seatbelt and bike helmet use. Sunscreen use. Work and work Astronomer. Physical exam Your health care provider will check your: Height and weight. These may be used to calculate your BMI (body mass index). BMI is a measurement that tells if you are at a healthy weight. Waist circumference. This measures the distance around your waistline. This measurement also tells if you are at a healthy weight and may help predict your risk of certain diseases, such as type 2 diabetes and high blood pressure. Heart rate and blood pressure. Body temperature. Skin for abnormal spots. What immunizations do I need?  Vaccines are usually given at various ages, according to a schedule. Your health care provider will recommend vaccines for you based on your age, medical history, and lifestyle or other factors, such as travel or where you work. What tests do I need? Screening Your health care provider may recommend screening tests for certain conditions. This may include: Lipid and cholesterol levels. Diabetes screening. This is done by checking your blood sugar (glucose) after you have not eaten for a while (fasting). Hepatitis B test. Hepatitis C  test. HIV (human immunodeficiency virus) test. STI (sexually transmitted infection) testing, if you are at risk. Lung cancer screening. Prostate cancer screening. Colorectal cancer screening. Talk with your health care provider about your test results, treatment options, and if necessary, the need for more tests. Follow these instructions at home: Eating and drinking  Eat a diet that includes fresh fruits and vegetables, whole grains, lean protein, and low-fat dairy products. Take vitamin and mineral supplements as recommended by your health care provider. Do not drink alcohol if your health care provider tells you not to drink. If you drink alcohol: Limit how much you have to 0-2 drinks a day. Know how much alcohol is in your drink. In the [place] one drink equals one 12 oz bottle of beer (355 mL), one 5 oz glass of wine (148 mL), or one 1 oz glass of hard liquor (44 mL). Lifestyle Brush your teeth every morning and night with fluoride toothpaste. Floss one time each day. Exercise for at least 30 minutes 5 or more days each week. Do not use any products that contain nicotine or tobacco. These products include cigarettes, chewing tobacco, and vaping devices, such as e-cigarettes. If you need help quitting, ask your health care provider. Do not use drugs. If you are sexually active, practice safe sex. Use a condom or other form of protection to prevent STIs. Take aspirin only as told by your health care provider. Make sure that you understand how much to take and what form to take. Work with your health care provider to find out whether it is safe and beneficial for you to take aspirin daily. Find healthy ways to manage stress, such as: Meditation, yoga, or listening to music. Journaling. Talking to a trusted  person. Spending time with friends and family. Minimize exposure to UV radiation to reduce your risk of skin cancer. Safety Always wear your seat belt while driving or riding in a  vehicle. Do not drive: If you have been drinking alcohol. Do not ride with someone who has been drinking. When you are tired or distracted. While texting. If you have been using any mind-altering substances or drugs. Wear a helmet and other protective equipment during sports activities. If you have firearms in your house, make sure you follow all gun safety procedures. What's next? Go to your health care provider once a year for an annual wellness visit. Ask your health care provider how often you should have your eyes and teeth checked. Stay up to date on all vaccines. This information is not intended to replace advice given to you by your health care provider. Make sure you discuss any questions you have with your health care provider. Document Revised: 08/22/2020 Document Reviewed: 08/22/2020 Elsevier Patient Education  [place].

## 2021-12-30 NOTE — Assessment & Plan Note (Addendum)
Is improving after a course of azithromax. Encouraged increased rest and hydration, add probiotics, zinc such as Coldeze or Xicam.

## 2022-01-01 LAB — INTRINSIC FACTOR ANTIBODIES: Intrinsic Factor: NEGATIVE

## 2022-01-09 ENCOUNTER — Encounter: Payer: Self-pay | Admitting: Family Medicine

## 2022-01-14 ENCOUNTER — Ambulatory Visit (INDEPENDENT_AMBULATORY_CARE_PROVIDER_SITE_OTHER): Payer: No Typology Code available for payment source

## 2022-01-14 DIAGNOSIS — E538 Deficiency of other specified B group vitamins: Secondary | ICD-10-CM | POA: Diagnosis not present

## 2022-01-14 MED ORDER — CYANOCOBALAMIN 1000 MCG/ML IJ SOLN
1000.0000 ug | Freq: Once | INTRAMUSCULAR | Status: AC
  Administered 2022-01-14: 1000 ug via INTRAMUSCULAR

## 2022-01-14 NOTE — Progress Notes (Signed)
Jared Myers is a 41 y.o. male presents to the office today for 6/6 Monthly B12 injection  per physician's orders. Original order: ES: 05/20/21 "4 weeks followed by monthly injection for 5 months. Then repeat b12 level in 6 months. " cyanocobalamin, 1000 mg/ml IM was administered in L deltoid today. Patient tolerated injection. Patient due for follow up labs/provider appt: No.  Patient next injection due: No further scheduled since lab note stated: "Negative intrinsic factor so the Vitamin B12 sublingual should be helpful. Take 1000 mcg sublingual (under tongue) daily "  Y Lowne- DOD   Melvin, [name]

## 2022-01-27 ENCOUNTER — Encounter: Payer: Self-pay | Admitting: Family Medicine

## 2022-01-27 ENCOUNTER — Telehealth: Payer: Self-pay | Admitting: Family Medicine

## 2022-01-27 ENCOUNTER — Telehealth (INDEPENDENT_AMBULATORY_CARE_PROVIDER_SITE_OTHER): Payer: No Typology Code available for payment source | Admitting: Family Medicine

## 2022-01-27 DIAGNOSIS — K529 Noninfective gastroenteritis and colitis, unspecified: Secondary | ICD-10-CM

## 2022-01-27 MED ORDER — ONDANSETRON 8 MG PO TBDP: 8.0000 mg | ORAL_TABLET | Freq: Three times a day (TID) | ORAL | 3 refills | Status: DC | PRN

## 2022-01-27 NOTE — Telephone Encounter (Signed)
Pt called stating that his job is requiring him to have the word "incapacitated" in his letter for being out of work. Pt needs a new letter with this included for him to be excused from work.

## 2022-01-27 NOTE — Progress Notes (Signed)
Virtual Video Visit via MyChart Note  I connected with  [name] on 01/27/22 at  2:00 PM EST by the video enabled telemedicine application for MyChart, and verified that I am speaking with the correct person using two identifiers.   I introduced myself as a Publishing rights manager with the practice. We discussed the limitations of evaluation and management by telemedicine and the availability of in person appointments. The patient expressed understanding and agreed to proceed.  Participating parties in this visit include: The patient and the nurse practitioner listed.  The patient is: At home I am: In the office - [facility] [address]  Subjective:    CC:  Chief Complaint  Patient presents with   Nausea    Sxs started on Friday last week. Pt states having diarrhea and was able to keep food down this morning. Pt reports chills.     HPI: Jared Myers is a 41 y.o. year old male presenting today via MyChart today for stomach flu.  HPI  Patient reports his wife and young children are getting over the stomach flu (going around their preschool). Reports that he has been staying home with them since Friday to help take care of them and he woke up this morning with nausea and diarrhea. He has not yet vomited. He denies any blood in stool, fevers, body aches, chills. Reports the frequent diarrhea is irritating his pelvic floor (hx of dysfunction), but he is managing alright for now. He would like something for the nausea and a work note.     Past medical history, Surgical history, Family history not pertinant except as noted below, Social history, Allergies, and medications have been entered into the medical record, reviewed, and corrections made.   Review of Systems:  All review of systems negative except what is listed in the HPI   Objective:    General:  Speaking clearly in complete sentences. Absent shortness of breath noted.   Alert and oriented x3.    Normal judgment.  Absent acute distress.   Impression and Recommendations:    1. Gastroenteritis Zofran sent in for nausea Try Imodium for diarrhea Hydrate and start with bland diet, slowly advance as tolerated Patient aware of signs/symptoms requiring further/urgent evaluation.   - ondansetron (ZOFRAN-ODT) 8 MG disintegrating tablet; Take 1 tablet (8 mg total) by mouth every 8 (eight) hours as needed for nausea.  Dispense: 20 tablet; Refill: 3     Follow-up if symptoms worsen or fail to improve.    I discussed the assessment and treatment plan with the patient. The patient was provided an opportunity to ask questions and all were answered. The patient agreed with the plan and demonstrated an understanding of the instructions.   The patient was advised to call back or seek an in-person evaluation if the symptoms worsen or if the condition fails to improve as anticipated.  I spent 20 minutes dedicated to the care of this patient on the date of this encounter to include pre-visit chart review of prior notes and results, face-to-face time with the patient, and post-visit ordering of testing as indicated.   Clayborne Dana, NP

## 2022-01-27 NOTE — Patient Instructions (Signed)
So sorry you aren't feeling well! Sending in Zofran for the nausea. Okay to try Imodium for the diarrhea. Start with pushing fluids and eating a very bland diet until you are feeling better. Please let us know if anything changes.   Please contact office for follow-up if symptoms do not improve or worsen. Seek emergency care if symptoms become severe.

## 2022-02-27 ENCOUNTER — Ambulatory Visit (INDEPENDENT_AMBULATORY_CARE_PROVIDER_SITE_OTHER): Payer: No Typology Code available for payment source | Admitting: Family Medicine

## 2022-02-27 ENCOUNTER — Encounter: Payer: Self-pay | Admitting: Family Medicine

## 2022-02-27 VITALS — BP 130/87 | HR 78 | Temp 97.9°F | Resp 16 | Ht 68.0 in | Wt 196.6 lb

## 2022-02-27 DIAGNOSIS — J029 Acute pharyngitis, unspecified: Secondary | ICD-10-CM | POA: Diagnosis not present

## 2022-02-27 DIAGNOSIS — J0120 Acute ethmoidal sinusitis, unspecified: Secondary | ICD-10-CM

## 2022-02-27 LAB — POCT INFLUENZA A/B
Influenza A, POC: NEGATIVE
Influenza B, POC: NEGATIVE

## 2022-02-27 LAB — POCT RAPID STREP A (OFFICE): Rapid Strep A Screen: NEGATIVE

## 2022-02-27 LAB — POC COVID19 BINAXNOW: SARS Coronavirus 2 Ag: NEGATIVE

## 2022-02-27 NOTE — Patient Instructions (Signed)
Flu, COVID, and strep negative today  Recommend taking another COVID test in 24-48 hours to ensure negative before you get with family over Christmas.  Continue supportive measures including rest, hydration, humidifier use, steam showers, warm compresses to sinuses, warm liquids with lemon and honey, and over-the-counter cough, cold, and analgesics as needed.   Please contact office for follow-up if symptoms do not improve or worsen. Seek emergency care if symptoms become severe.   -----------------------------------------------------------------------------------------------  Over the counter medications that may be helpful for symptoms:  Guaifenesin 1200 mg extended release tabs twice daily, with plenty of water For cough and congestion Brand name: Mucinex   Pseudoephedrine 30 mg, one or two tabs every 4 to 6 hours For sinus congestion Brand name: Sudafed You must get this from the pharmacy counter.  Oxymetazoline nasal spray each morning, one spray in each nostril, for NO MORE THAN 3 days  For nasal and sinus congestion Brand name: Afrin Saline nasal spray or Saline Nasal Irrigation 3-5 times a day For nasal and sinus congestion Brand names: Ocean or AYR Fluticasone nasal spray, one spray in each nostril, each morning (after oxymetazoline and saline, if used) For nasal and sinus congestion Brand name: Flonase Warm salt water gargles  For sore throat Every few hours as needed Alternate ibuprofen 400-600 mg and acetaminophen 1000 mg every 4-6 hours For fever, body aches, headache Brand names: Motrin or Advil and Tylenol Dextromethorphan 12-hour cough version 30 mg every 12 hours  For cough Brand name: Delsym Stop all other cold medications for now (Nyquil, Dayquil, Tylenol Cold, Theraflu, etc) and other non-prescription cough/cold preparations. Many of these have the same ingredients listed above and could cause an overdose of medication.   Herbal treatments that have been  shown to be helpful in some patients include: Vitamin C 1000mg  per day Vitamin D 4000iU per day Zinc 100mg  per day Quercetin 25-500mg  twice a day Melatonin 5-10mg  at bedtime  General Instructions Allow your body to rest Drink PLENTY of fluids Isolate yourself from everyone, even family, until test results have returned  If your COVID-19 test is positive Then you ARE INFECTED and you can pass the virus to others You must quarantine from others for a minimum of  5 days since symptoms started AND You are fever free for 24 hours WITHOUT any medication to reduce fever AND Your symptoms are improving Wear mask for the next 5 days If not improved by day 5, quarantine for the full 10 days. Do not go to the store or other public areas Do not go around household members who are not known to be infected with COVID-19 If you MUST leave your area of quarantine (example: go to a bathroom you share with others in your home), you must Wear a mask Wash your hands thoroughly Wipe down any surfaces you touch Do not share food, drinks, towels, or other items with other persons Dispose of your own tissues, food containers, etc  Once you have recovered, please continue good preventive care measures, including:  wearing a mask when in public wash your hands frequently avoid touching your face/nose/eyes cover coughs/sneezes with the inside of your elbow stay out of crowds keep a 6 foot distance from others  If you develop severe shortness of breath, uncontrolled fevers, coughing up blood, confusion, chest pain, or signs of dehydration (such as significantly decreased urine amounts or dizziness with standing) please go to the ER.

## 2022-02-27 NOTE — Progress Notes (Signed)
Acute Office Visit  Subjective:     Patient ID: Jared Myers, male    DOB: 12-Oct-1980, 41 y.o.   MRN: [id number]  CC: sore throat, URI   HPI  Upper Respiratory Infection: Patient complains of symptoms of a URI. Symptoms include congestion, cough, sore throat, and swollen glands, and mild chills. Onset of symptoms was 2 days ago, gradually worsening since that time.   He is drinking plenty of fluids. Evaluation to date: none. Treatment to date:  Nyquil, honey - no improvement . Patient denies any chest pain, palpitations, dyspnea, wheezing, edema, recurrent headaches, fevers, GI/GU symptoms.  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  He also mentions some occasional abdominal discomfort, but states he thinks that is from eating expired icing a few days ago. No severe pain, nausea, vomiting, or diarrhea.   ROS All review of systems negative except what is listed in the HPI      Objective:    BP 130/87   Pulse 78   Temp 97.9 F (36.6 C)   Resp 16   Ht 5\' 8"  (1.727 m)   Wt 196 lb 9.6 oz (89.2 kg)   SpO2 98%   BMI 29.89 kg/m    Physical Exam Vitals reviewed.  Constitutional:      Appearance: Normal appearance.  HENT:     Head: Normocephalic and atraumatic.     Right Ear: Tympanic membrane normal.     Left Ear: Tympanic membrane normal.     Nose: Nose normal.     Mouth/Throat:     Mouth: Mucous membranes are moist.     Pharynx: Oropharynx is clear. Posterior oropharyngeal erythema present.  Cardiovascular:     Rate and Rhythm: Normal rate and regular rhythm.  Pulmonary:     Effort: Pulmonary effort is normal.     Breath sounds: Normal breath sounds.  Musculoskeletal:     Cervical back: Normal range of motion and neck supple. No tenderness.  Lymphadenopathy:     Cervical: Cervical adenopathy present.  Skin:    General: Skin is warm and dry.  Neurological:     General: No focal deficit present.     Mental Status: He  is alert and oriented to person, place, and time. Mental status is at baseline.  Psychiatric:        Mood and Affect: Mood normal.        Behavior: Behavior normal.        Thought Content: Thought content normal.        Judgment: Judgment normal.        Results for orders placed or performed in visit on 02/27/22  POCT Influenza A/B  Result Value Ref Range   Influenza A, POC Negative Negative   Influenza B, POC Negative Negative  POC COVID-19  Result Value Ref Range   SARS Coronavirus 2 Ag Negative Negative  POCT rapid strep A  Result Value Ref Range   Rapid Strep A Screen Negative Negative        Assessment & Plan:   Problem List Items Addressed This Visit       Respiratory   Sinusitis - Primary    Sore throat     Likely viral  Flu, COVID, and strep negative today  Recommend taking another COVID test in 24-48 hours to ensure negative before you get with family over Christmas.  Continue supportive measures including rest, hydration, humidifier use, steam showers,  warm compresses to sinuses, warm liquids with lemon and honey, and over-the-counter cough, cold, and analgesics as needed. - Information added to AVS.  Please contact office for follow-up if symptoms do not improve or worsen. Seek emergency care if symptoms become severe.      Relevant Orders   POCT Influenza A/B (Completed)   POC COVID-19 (Completed)   POCT rapid strep A (Completed)       No orders of the defined types were placed in this encounter.   Return if symptoms worsen or fail to improve.  Clayborne Dana, NP

## 2022-03-01 ENCOUNTER — Encounter: Payer: Self-pay | Admitting: Family Medicine

## 2022-03-04 ENCOUNTER — Other Ambulatory Visit: Payer: Self-pay | Admitting: Family Medicine

## 2022-03-04 ENCOUNTER — Other Ambulatory Visit: Payer: Self-pay

## 2022-03-04 MED ORDER — AZITHROMYCIN 250 MG PO TABS: ORAL_TABLET | ORAL | 0 refills | Status: AC

## 2022-03-04 MED ORDER — ALBUTEROL SULFATE HFA 108 (90 BASE) MCG/ACT IN AERS: 2.0000 | INHALATION_SPRAY | Freq: Four times a day (QID) | RESPIRATORY_TRACT | 0 refills | Status: DC | PRN

## 2022-03-31 ENCOUNTER — Other Ambulatory Visit: Payer: Self-pay | Admitting: Family Medicine

## 2022-04-15 NOTE — Telephone Encounter (Signed)
error 

## 2022-05-04 NOTE — Assessment & Plan Note (Signed)
Supplement and monitor 

## 2022-05-04 NOTE — Assessment & Plan Note (Signed)
hgba1c acceptable, minimize simple carbs. Increase exercise as tolerated.  

## 2022-05-04 NOTE — Assessment & Plan Note (Signed)
Encourage heart healthy diet such as MIND or DASH diet, increase exercise, avoid trans fats, simple carbohydrates and processed foods, consider a krill or fish or flaxseed oil cap daily.  °

## 2022-05-05 ENCOUNTER — Other Ambulatory Visit (HOSPITAL_BASED_OUTPATIENT_CLINIC_OR_DEPARTMENT_OTHER): Payer: Self-pay

## 2022-05-05 ENCOUNTER — Other Ambulatory Visit: Payer: Self-pay

## 2022-05-05 ENCOUNTER — Ambulatory Visit: Payer: No Typology Code available for payment source | Admitting: Family Medicine

## 2022-05-05 VITALS — BP 126/78 | HR 82 | Temp 97.5°F | Resp 16 | Ht 67.0 in | Wt 196.6 lb

## 2022-05-05 DIAGNOSIS — M546 Pain in thoracic spine: Secondary | ICD-10-CM | POA: Diagnosis not present

## 2022-05-05 DIAGNOSIS — E559 Vitamin D deficiency, unspecified: Secondary | ICD-10-CM

## 2022-05-05 DIAGNOSIS — E538 Deficiency of other specified B group vitamins: Secondary | ICD-10-CM

## 2022-05-05 DIAGNOSIS — M19071 Primary osteoarthritis, right ankle and foot: Secondary | ICD-10-CM

## 2022-05-05 DIAGNOSIS — E782 Mixed hyperlipidemia: Secondary | ICD-10-CM

## 2022-05-05 DIAGNOSIS — R739 Hyperglycemia, unspecified: Secondary | ICD-10-CM

## 2022-05-05 DIAGNOSIS — G8929 Other chronic pain: Secondary | ICD-10-CM

## 2022-05-05 LAB — COMPREHENSIVE METABOLIC PANEL
ALT: 20 U/L (ref 0–53)
AST: 15 U/L (ref 0–37)
Albumin: 4.5 g/dL (ref 3.5–5.2)
Alkaline Phosphatase: 62 U/L (ref 39–117)
BUN: 14 mg/dL (ref 6–23)
CO2: 29 mEq/L (ref 19–32)
Calcium: 10 mg/dL (ref 8.4–10.5)
Chloride: 102 mEq/L (ref 96–112)
Creatinine, Ser: 1.1 mg/dL (ref 0.40–1.50)
GFR: 83.05 mL/min (ref >60.00)
Glucose, Bld: 89 mg/dL (ref 70–99)
Potassium: 4.3 mEq/L (ref 3.5–5.1)
Sodium: 139 mEq/L (ref 135–145)
Total Bilirubin: 0.8 mg/dL (ref 0.2–1.2)
Total Protein: 7.4 g/dL (ref 6.0–8.3)

## 2022-05-05 LAB — CBC WITH DIFFERENTIAL/PLATELET
Basophils Absolute: 0 10*3/uL (ref 0.0–0.1)
Basophils Relative: 0.6 % (ref 0.0–3.0)
Eosinophils Absolute: 0.1 10*3/uL (ref 0.0–0.7)
Eosinophils Relative: 2.1 % (ref 0.0–5.0)
HCT: 44.9 % (ref 39.0–52.0)
Hemoglobin: 15.8 g/dL (ref 13.0–17.0)
Lymphocytes Relative: 37.8 % (ref 12.0–46.0)
Lymphs Abs: 2.1 10*3/uL (ref 0.7–4.0)
MCHC: 35.1 g/dL (ref 30.0–36.0)
MCV: 87.3 fl (ref 78.0–100.0)
Monocytes Absolute: 0.5 10*3/uL (ref 0.1–1.0)
Monocytes Relative: 9.4 % (ref 3.0–12.0)
Neutro Abs: 2.8 10*3/uL (ref 1.4–7.7)
Neutrophils Relative %: 50.1 % (ref 43.0–77.0)
Platelets: 283 10*3/uL (ref 150.0–400.0)
RBC: 5.15 Mil/uL (ref 4.22–5.81)
RDW: 13.1 % (ref 11.5–15.5)
WBC: 5.6 10*3/uL (ref 4.0–10.5)

## 2022-05-05 LAB — HEMOGLOBIN A1C: Hgb A1c MFr Bld: 5.3 % (ref 4.6–6.5)

## 2022-05-05 LAB — LIPID PANEL
Cholesterol: 213 mg/dL — ABNORMAL HIGH (ref 0–200)
HDL: 43.7 mg/dL (ref >39.00)
LDL Cholesterol: 145 mg/dL — ABNORMAL HIGH (ref 0–99)
NonHDL: 168.94
Total CHOL/HDL Ratio: 5
Triglycerides: 122 mg/dL (ref 0.0–149.0)
VLDL: 24.4 mg/dL (ref 0.0–40.0)

## 2022-05-05 LAB — VITAMIN B12: Vitamin B-12: 306 pg/mL (ref 211–911)

## 2022-05-05 LAB — TSH: TSH: 0.88 u[IU]/mL (ref 0.35–5.50)

## 2022-05-05 LAB — VITAMIN D 25 HYDROXY (VIT D DEFICIENCY, FRACTURES): VITD: 17.25 ng/mL — ABNORMAL LOW (ref 30.00–100.00)

## 2022-05-05 MED ORDER — VITAMIN D (ERGOCALCIFEROL) 1.25 MG (50000 UNIT) PO CAPS
50000.0000 [IU] | ORAL_CAPSULE | ORAL | 4 refills | Status: DC
  Filled 2022-05-05: qty 5, 35d supply, fill #0

## 2022-05-05 MED ORDER — VITAMIN D (ERGOCALCIFEROL) 1.25 MG (50000 UNIT) PO CAPS: 50000.0000 [IU] | ORAL_CAPSULE | ORAL | 4 refills | Status: DC

## 2022-05-05 NOTE — Assessment & Plan Note (Signed)
Encouraged moist heat and gentle stretching as tolerated. May try NSAIDs and prescription meds as directed and report if symptoms worsen or seek immediate care 

## 2022-05-05 NOTE — Progress Notes (Signed)
Subjective:    Patient ID: Jared Myers, male    DOB: Sep 02, 1980, 42 y.o.   MRN: [id number]  Chief Complaint  Patient presents with   Follow-up    Follow up    HPI Patient is in today for follow up on chronic medical concerns. No recent febrile illness or acute hospitalizations. Is struggling with some increase in his chronic thoracic back pain as well as right foot pain and knee pain. He traces it all back to his roller skating days,   Past Medical History:  Diagnosis Date   Anxiety in acute stress reaction 01/06/2011   Constipation 12/18/2013   Hyperglycemia 09/28/2014   Hyperlipidemia, mixed 06/22/2014   Infectious mononucleosis 07/18/2013   Inguinal hernia 05/12/2012   Low back pain 12/16/2013   Overweight 06/22/2014   Pelvic floor dysfunction    Premature ventricular beat    Splenomegaly 07/18/2013   Tremulousness 08/14/2013    Past Surgical History:  Procedure Laterality Date   NO PAST SURGERIES      Family History  Problem Relation Age of Onset   Cancer Mother        Non Hodkins lymphoma, breast cancer   Other Father        pituatary adenoma   Cancer Paternal Uncle        recurrent prostate    Heart disease Maternal Grandmother        chf   Heart disease Maternal Grandfather        chf   Heart disease Paternal Grandfather        chf   Depression Brother    Anemia Sister     Social History   Socioeconomic History   Marital status: Married    Spouse name: Not on file   Number of children: Not on file   Years of education: Not on file   Highest education level: Not on file  Occupational History   Not on file  Tobacco Use   Smoking status: Never   Smokeless tobacco: Never  Substance and Sexual Activity   Alcohol use: No   Drug use: No   Sexual activity: Yes    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, works at post office, 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  Other Topics Concern   Not on file  Social History Narrative   New baby on the way 07/15/16   Wife  is 13 weeks   Social Determinants of Radio broadcast assistant Strain: Not on file  Food Insecurity: Not on file  Transportation Needs: Not on file  Physical Activity: Not on file  Stress: Not on file  Social Connections: Not on file  Intimate Partner Violence: Not on file    Outpatient Medications Prior to Visit  Medication Sig Dispense Refill   albuterol (VENTOLIN HFA) 108 (90 Base) MCG/ACT inhaler TAKE 2 PUFFS BY MOUTH EVERY 6 HOURS AS NEEDED FOR WHEEZE OR SHORTNESS OF BREATH 8.5 each 0   levocetirizine (XYZAL) 5 MG tablet Take 1 tablet (5 mg total) by mouth every evening. 30 tablet 3   tiZANidine (ZANAFLEX) 4 MG tablet Take 1 tablet (4 mg total) by mouth every 8 (eight) hours as needed for muscle spasms. 60 tablet 3   No facility-administered medications prior to visit.    Allergies  Allergen Reactions   Guaifenesin     REACTION: PVCs   Prednisone     PVC    Review of Systems  Constitutional:  Negative for fever and malaise/fatigue.  HENT:  Negative for congestion.   Eyes:  Negative for blurred vision.  Respiratory:  Negative for shortness of breath.   Cardiovascular:  Negative for chest pain, palpitations and leg swelling.  Gastrointestinal:  Negative for abdominal pain, blood in stool and nausea.  Genitourinary:  Negative for dysuria and frequency.  Musculoskeletal:  Positive for joint pain. Negative for falls.  Skin:  Negative for rash.  Neurological:  Negative for dizziness, loss of consciousness and headaches.  Endo/Heme/Allergies:  Negative for environmental allergies.  Psychiatric/Behavioral:  Negative for depression. The patient is not nervous/anxious.        Objective:    Physical Exam Constitutional:      General: He is not in acute distress.    Appearance: Normal appearance. He is not ill-appearing or toxic-appearing.  HENT:     Head: Normocephalic and atraumatic.     Right Ear: External ear normal.     Left Ear: External ear normal.     Nose:  Nose normal.  Eyes:     General:        Right eye: No discharge.        Left eye: No discharge.     Conjunctiva/sclera: Conjunctivae normal.     Pupils: Pupils are equal, round, and reactive to light.  Pulmonary:     Effort: Pulmonary effort is normal.     Breath sounds: No wheezing.  Abdominal:     General: Abdomen is flat. Bowel sounds are normal.     Palpations: Abdomen is soft.  Musculoskeletal:        General: No swelling.     Comments: Fallen arches of feet noted.  Skin:    General: Skin is warm.     Findings: No rash.  Neurological:     Mental Status: He is alert and oriented to person, place, and time.  Psychiatric:        Behavior: Behavior normal.     BP 126/78 (BP Location: Right Arm, Patient Position: Sitting, Cuff Size: Normal)   Pulse 82   Temp (!) 97.5 F (36.4 C) (Oral)   Resp 16   Ht '5\' 7"'$  (1.702 m)   Wt 196 lb 9.6 oz (89.2 kg)   SpO2 99%   BMI 30.79 kg/m  Wt Readings from Last 3 Encounters:  05/05/22 196 lb 9.6 oz (89.2 kg)  02/27/22 196 lb 9.6 oz (89.2 kg)  12/30/21 197 lb 12.8 oz (89.7 kg)    Diabetic Foot Exam - Simple   No data filed    Lab Results  Component Value Date   WBC 5.6 05/05/2022   HGB 15.8 05/05/2022   HCT 44.9 05/05/2022   PLT 283.0 05/05/2022   GLUCOSE 89 05/05/2022   CHOL 213 (H) 05/05/2022   TRIG 122.0 05/05/2022   HDL 43.70 05/05/2022   LDLDIRECT 120.0 07/23/2020   LDLCALC 145 (H) 05/05/2022   ALT 20 05/05/2022   AST 15 05/05/2022   NA 139 05/05/2022   K 4.3 05/05/2022   CL 102 05/05/2022   CREATININE 1.10 05/05/2022   BUN 14 05/05/2022   CO2 29 05/05/2022   TSH 0.88 05/05/2022   PSA 0.52 01/24/2014   HGBA1C 5.3 05/05/2022    Lab Results  Component Value Date   TSH 0.88 05/05/2022   Lab Results  Component Value Date   WBC 5.6 05/05/2022   HGB 15.8 05/05/2022   HCT 44.9 05/05/2022   MCV 87.3 05/05/2022   PLT 283.0 05/05/2022   Lab Results  Component Value Date   NA 139 05/05/2022   K 4.3  05/05/2022   CO2 29 05/05/2022   GLUCOSE 89 05/05/2022   BUN 14 05/05/2022   CREATININE 1.10 05/05/2022   BILITOT 0.8 05/05/2022   ALKPHOS 62 05/05/2022   AST 15 05/05/2022   ALT 20 05/05/2022   PROT 7.4 05/05/2022   ALBUMIN 4.5 05/05/2022   CALCIUM 10.0 05/05/2022   ANIONGAP 7 06/06/2014   GFR 83.05 05/05/2022   Lab Results  Component Value Date   CHOL 213 (H) 05/05/2022   Lab Results  Component Value Date   HDL 43.70 05/05/2022   Lab Results  Component Value Date   LDLCALC 145 (H) 05/05/2022   Lab Results  Component Value Date   TRIG 122.0 05/05/2022   Lab Results  Component Value Date   CHOLHDL 5 05/05/2022   Lab Results  Component Value Date   HGBA1C 5.3 05/05/2022       Assessment & Plan:  Hyperglycemia Assessment & Plan: hgba1c acceptable, minimize simple carbs. Increase exercise as tolerated.   Orders: -     Comprehensive metabolic panel -     TSH -     Hemoglobin A1c  Hyperlipidemia, mixed Assessment & Plan: Encourage heart healthy diet such as MIND or DASH diet, increase exercise, avoid trans fats, simple carbohydrates and processed foods, consider a krill or fish or flaxseed oil cap daily.   Orders: -     Lipid panel -     TSH  Vitamin B12 deficiency Assessment & Plan: Supplement and monitor   Orders: -     Vitamin B12  Vitamin D deficiency Assessment & Plan: Supplement and monitor   Orders: -     VITAMIN D 25 Hydroxy (Vit-D Deficiency, Fractures)  Chronic thoracic back pain, unspecified back pain laterality Assessment & Plan: Encouraged moist heat and gentle stretching as tolerated. May try NSAIDs and prescription meds as directed and report if symptoms worsen or seek immediate care   Orders: -     CBC with Differential/Platelet  Arthritis of right foot Assessment & Plan: Right foot pain dating back to the days of his roller skating. He has dropped arches and pain is largely concentrated along the lateral of his righ foot  from base of ankle down length of the fifth metatarsal will check xray and refer to podiatry for further evaluation.  Orders: -     DG Foot Complete Right; Future -     Ambulatory referral to Podiatry    Penni Homans, MD

## 2022-05-05 NOTE — Assessment & Plan Note (Signed)
Right foot pain dating back to the days of his roller skating. He has dropped arches and pain is largely concentrated along the lateral of his righ foot from base of ankle down length of the fifth metatarsal will check xray and refer to podiatry for further evaluation.

## 2022-05-05 NOTE — Patient Instructions (Addendum)
Jared Myers   Arthritis Arthritis is a term that is commonly used to refer to joint pain or joint disease. There are more than 100 types of arthritis. What are the causes? The most common cause of this condition is wear and tear of a joint. Other causes include: Gout. Inflammation of a joint. An infection of a joint. Sprains and other injuries near the joint. A reaction to medicines or drugs, or an allergic reaction. In some cases, the cause may not be known. What are the signs or symptoms? The main symptom of this condition is pain in the joint during movement. Other symptoms include: Redness, swelling, or stiffness at a joint. Warmth coming from the joint. Fever. Overall feeling of illness. How is this diagnosed? This condition may be diagnosed with a physical exam and tests, including: Blood tests. Urine tests. Imaging tests, such as X-rays, an MRI, or a CT scan. Sometimes, fluid is removed from a joint for testing. How is this treated? This condition may be treated with: Treatment of the cause, if it is known. Rest. Raising (elevating) the joint. Applying cold or hot packs to the joint. Medicines to improve symptoms and reduce inflammation. Injections of a steroid, such as cortisone, into the joint to help reduce pain and inflammation. Depending on the cause of your arthritis, you may need to make lifestyle changes to reduce stress on your joint. Changes may include: Exercising more. Losing weight. Follow these instructions at home: Medicines Take over-the-counter and prescription medicines only as told by your health care provider. Do not take aspirin to relieve pain if your health care provider thinks that gout may be causing your pain. Activity Rest your joint if told by your health care provider. Rest is important when your disease is active and your joint feels painful, swollen, or stiff. Avoid activities that make the pain worse. Balance activity with  rest. Exercise your joint regularly with range-of-motion exercises as told by your health care provider. Try doing low-impact exercise, such as: Swimming. Water aerobics. Biking. Walking. Managing pain, stiffness, and swelling     If directed, put ice on the affected joint. To do this: Put ice in a plastic bag. Place a towel between your skin and the bag. Leave the ice on for 20 minutes, 2-3 times a day. Remove the ice if your skin turns bright red. This is very important. If you cannot feel pain, heat, or cold, you have a greater risk of damage to the area. If your joint is swollen, raise (elevate) it above the level of your heart if directed by your health care provider. If your joint feels stiff in the morning, try taking a warm shower. If directed, apply heat to the affected area as often as told by your health care provider. Use the heat source that your health care provider recommends, such as a moist heat pack or a heating pad. To apply heat: Place a towel between your skin and the heat source. Leave the heat on for 20-30 minutes. Remove the heat if your skin turns bright red. This is especially important if you are unable to feel pain, heat, or cold. You have a greater risk of getting burned. General instructions Maintain a healthy weight. Follow instructions from your health care provider for weight control. Do not use any products that contain nicotine or tobacco. These products include cigarettes, chewing tobacco, and vaping devices, such as e-cigarettes. If you need help quitting, ask your health care provider. Keep all follow-up visits.  This is important. Where to find more information [employer] [facility]: www.niams.[employer].es Contact a health care provider if: The pain gets worse. You have a fever. Get help right away if: You develop severe joint pain, swelling, or redness. Many joints become painful and swollen. You develop severe back pain. You develop severe  weakness in your leg. Summary Arthritis is a term that is commonly used to refer to joint pain or joint disease. There are more than 100 types of arthritis. The most common cause of this condition is wear and tear of a joint. Other causes include gout, inflammation or infection of the joint, sprains, or allergies. Symptoms of this condition include redness, swelling, or stiffness of the joint. Other symptoms include warmth, fever, or feeling ill. This condition is treated with rest, elevation, medicines, and applying cold or hot packs. Follow your health care provider's instructions about medicines, activity, exercises, and other home care treatments. This information is not intended to replace advice given to you by your health care provider. Make sure you discuss any questions you have with your health care provider. Document Revised: 12/04/2020 Document Reviewed: 12/04/2020 Elsevier Patient Education  [place].

## 2022-05-07 ENCOUNTER — Ambulatory Visit: Payer: No Typology Code available for payment source | Admitting: Podiatry

## 2022-05-07 ENCOUNTER — Encounter: Payer: Self-pay | Admitting: Podiatry

## 2022-05-07 DIAGNOSIS — M76821 Posterior tibial tendinitis, right leg: Secondary | ICD-10-CM

## 2022-05-07 NOTE — Progress Notes (Signed)
  Subjective:  Patient ID: Jared Myers, male    DOB: 1980-10-13,  MRN: [id number]  Chief Complaint  Patient presents with   Foot Pain    Right foot pain for about a month     42 y.o. male presents with the above complaint.  Patient presents with right posterior tibial tendinitis pain.  Patient states been going for quite some time for a month.  Has progressive gotten worse worse with ambulation worse with pressure when to get it evaluated he is constantly on his foot.  He notices a lot of pain when he is on his feet.  He has not seen anyone as prior to seeing me.  Pain scale is 5 out of 10 sharp shooting in nature.   Review of Systems: Negative except as noted in the HPI. Denies N/V/F/Ch.  Past Medical History:  Diagnosis Date   Anxiety in acute stress reaction 01/06/2011   Constipation 12/18/2013   Hyperglycemia 09/28/2014   Hyperlipidemia, mixed 06/22/2014   Infectious mononucleosis 07/18/2013   Inguinal hernia 05/12/2012   Low back pain 12/16/2013   Overweight 06/22/2014   Pelvic floor dysfunction    Premature ventricular beat    Splenomegaly 07/18/2013   Tremulousness 08/14/2013    Current Outpatient Medications:    albuterol (VENTOLIN HFA) 108 (90 Base) MCG/ACT inhaler, TAKE 2 PUFFS BY MOUTH EVERY 6 HOURS AS NEEDED FOR WHEEZE OR SHORTNESS OF BREATH, Disp: 8.5 each, Rfl: 0   levocetirizine (XYZAL) 5 MG tablet, Take 1 tablet (5 mg total) by mouth every evening., Disp: 30 tablet, Rfl: 3   tiZANidine (ZANAFLEX) 4 MG tablet, Take 1 tablet (4 mg total) by mouth every 8 (eight) hours as needed for muscle spasms., Disp: 60 tablet, Rfl: 3   Vitamin D, Ergocalciferol, (DRISDOL) 1.25 MG (50000 UNIT) CAPS capsule, Take 1 capsule (50,000 Units total) by mouth every 7 (seven) days., Disp: 5 capsule, Rfl: 4  Social History   Tobacco Use  Smoking Status Never  Smokeless Tobacco Never    Allergies  Allergen Reactions   Guaifenesin     REACTION: PVCs   Prednisone     PVC   Objective:   There were no vitals filed for this visit. There is no height or weight on file to calculate BMI. Constitutional Well developed. Well nourished.  Vascular Dorsalis pedis pulses palpable bilaterally. Posterior tibial pulses palpable bilaterally. Capillary refill normal to all digits.  No cyanosis or clubbing noted. Pedal hair growth normal.  Neurologic Normal speech. Oriented to person, place, and time. Epicritic sensation to light touch grossly present bilaterally.  Dermatologic Nails well groomed and normal in appearance. No open wounds. No skin lesions.  Orthopedic: Right pain on palpation along the course of the posterior tibial tendon pain at the insertion.  Pain with resisted plantarflexion inversion of the foot.  No pain with dorsiflexion and eversion of the foot no pain on the course of peroneal tendon Achilles tendon ATFL ligament   Radiographs: None Assessment:   1. Posterior tibial tendinitis, right    Plan:  Patient was evaluated and treated and all questions answered.  Right posterior tibial tendinitis -All questions and concerns were discussed with the patient in extensive detail given the amount of pain that he is having he will benefit from cam boot immobilization help decrease inflammatory component associate with pain.  Patient agrees with plan like to proceed with cam boot immobilization -Cam boot was dispensed  No follow-ups on file.

## 2022-05-10 ENCOUNTER — Telehealth: Payer: Self-pay | Admitting: Podiatry

## 2022-05-10 NOTE — Telephone Encounter (Signed)
Patient called with issue with boot, advised fits L and R, will try adding additional padding to boot and make sure fits snug but not over tightened

## 2022-05-19 ENCOUNTER — Other Ambulatory Visit (HOSPITAL_BASED_OUTPATIENT_CLINIC_OR_DEPARTMENT_OTHER): Payer: Self-pay

## 2022-05-19 ENCOUNTER — Ambulatory Visit: Payer: No Typology Code available for payment source | Admitting: Medical

## 2022-05-19 VITALS — BP 131/78 | HR 88 | Temp 98.0°F | Resp 16 | Ht 67.0 in | Wt 197.8 lb

## 2022-05-19 DIAGNOSIS — J029 Acute pharyngitis, unspecified: Secondary | ICD-10-CM | POA: Diagnosis not present

## 2022-05-19 DIAGNOSIS — J3489 Other specified disorders of nose and nasal sinuses: Secondary | ICD-10-CM | POA: Diagnosis not present

## 2022-05-19 DIAGNOSIS — J302 Other seasonal allergic rhinitis: Secondary | ICD-10-CM | POA: Diagnosis not present

## 2022-05-19 DIAGNOSIS — J020 Streptococcal pharyngitis: Secondary | ICD-10-CM | POA: Diagnosis not present

## 2022-05-19 LAB — POCT RAPID STREP A (OFFICE): Rapid Strep A Screen: POSITIVE — AB

## 2022-05-19 LAB — POCT INFLUENZA A/B
Influenza A, POC: NEGATIVE
Influenza B, POC: NEGATIVE

## 2022-05-19 LAB — POC COVID19 BINAXNOW: SARS Coronavirus 2 Ag: NEGATIVE

## 2022-05-19 MED ORDER — AZITHROMYCIN 250 MG PO TABS
ORAL_TABLET | ORAL | 0 refills | Status: AC
  Filled 2022-05-19: qty 6, 5d supply, fill #0

## 2022-05-19 MED ORDER — BENZONATATE 100 MG PO CAPS
100.0000 mg | ORAL_CAPSULE | Freq: Three times a day (TID) | ORAL | 0 refills | Status: DC | PRN
  Filled 2022-05-19: qty 30, 10d supply, fill #0

## 2022-05-19 NOTE — Addendum Note (Signed)
Addended by: Anabel Halon on: 05/19/2022 04:11 PM   Modules accepted: Orders

## 2022-05-19 NOTE — Patient Instructions (Addendum)
Your strep test was positive. I am prescribing azithromycin antibiotic.(You question pcn side effect in past so rx azithromycin). You have some redness to ear and sinus pressure. If symptoms persist past antibiotic let me now. Rest hydrate, tylenol for fever and warm salt water gargles.   Rx benzonatate for cough.  Discussed seasonal allergies.- start flonase nasal spray. In 7-10 days can add on claritin.  Follow up in 7 days or as needed.

## 2022-05-19 NOTE — Progress Notes (Addendum)
Subjective:    Patient ID: Jared Myers, male    DOB: [birth year], 42 y.o.   MRN: [id number]  HPI  Pt in for for nasal congestion, sinus pressure and moderate sore throat. Symptoms started on past Wednesday. ST just started today.  States sinus symptoms worsen then throat. Wed last week had fever of 100.4.     Review of Systems  Constitutional:  Negative for chills, fatigue and fever.  HENT:  Positive for congestion, sinus pressure, sinus pain and sore throat. Negative for ear pain, nosebleeds, postnasal drip, sneezing and trouble swallowing.   Respiratory:  Negative for cough, chest tightness, shortness of breath and wheezing.   Cardiovascular:  Negative for chest pain and palpitations.  Gastrointestinal:  Negative for abdominal pain, blood in stool, nausea and rectal pain.  Genitourinary:  Negative for dysuria, flank pain and frequency.  Musculoskeletal:  Negative for back pain, myalgias and neck stiffness.  Skin:  Negative for rash.  Neurological:  Negative for facial asymmetry, speech difficulty, weakness and numbness.  Hematological:  Negative for adenopathy. Does not bruise/bleed easily.  Psychiatric/Behavioral:  Negative for behavioral problems, decreased concentration and dysphoric mood. The patient is not nervous/anxious.     Past Medical History:  Diagnosis Date   Anxiety in acute stress reaction 01/06/2011   Constipation 12/18/2013   Hyperglycemia 09/28/2014   Hyperlipidemia, mixed 06/22/2014   Infectious mononucleosis 07/18/2013   Inguinal hernia 05/12/2012   Low back pain 12/16/2013   Overweight 06/22/2014   Pelvic floor dysfunction    Premature ventricular beat    Splenomegaly 07/18/2013   Tremulousness 08/14/2013     Social History   Socioeconomic History   Marital status: Married    Spouse name: Not on file   Number of children: Not on file   Years of education: Not on file   Highest education level: Not on file  Occupational History   Not on file   Tobacco Use   Smoking status: Never   Smokeless tobacco: Never  Substance and Sexual Activity   Alcohol use: No   Drug use: No   Sexual activity: Yes    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, works at post office, 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  Other Topics Concern   Not on file  Social History Narrative   New baby on the way 07/15/16   Wife is 13 weeks   Social Determinants of Radio broadcast assistant Strain: Not on file  Food Insecurity: Not on file  Transportation Needs: Not on file  Physical Activity: Not on file  Stress: Not on file  Social Connections: Not on file  Intimate Partner Violence: Not on file    Past Surgical History:  Procedure Laterality Date   NO PAST SURGERIES      Family History  Problem Relation Age of Onset   Cancer Mother        Non Hodkins lymphoma, breast cancer   Other Father        pituatary adenoma   Cancer Paternal Uncle        recurrent prostate    Heart disease Maternal Grandmother        chf   Heart disease Maternal Grandfather        chf   Heart disease Paternal Grandfather        chf   Depression Brother    Anemia Sister     Allergies  Allergen Reactions   Guaifenesin     REACTION: PVCs  Prednisone     PVC    Current Outpatient Medications on File Prior to Visit  Medication Sig Dispense Refill   albuterol (VENTOLIN HFA) 108 (90 Base) MCG/ACT inhaler TAKE 2 PUFFS BY MOUTH EVERY 6 HOURS AS NEEDED FOR WHEEZE OR SHORTNESS OF BREATH 8.5 each 0   levocetirizine (XYZAL) 5 MG tablet Take 1 tablet (5 mg total) by mouth every evening. 30 tablet 3   tiZANidine (ZANAFLEX) 4 MG tablet Take 1 tablet (4 mg total) by mouth every 8 (eight) hours as needed for muscle spasms. 60 tablet 3   Vitamin D, Ergocalciferol, (DRISDOL) 1.25 MG (50000 UNIT) CAPS capsule Take 1 capsule (50,000 Units total) by mouth every 7 (seven) days. 5 capsule 4   No current facility-administered medications on file prior to visit.    BP 131/78 (BP  Location: Right Arm, Patient Position: Sitting, Cuff Size: Normal)   Pulse 88   Temp 98 F (36.7 C) (Oral)   Resp 16   Ht '5\' 7"'$  (1.702 m)   Wt 197 lb 12.8 oz (89.7 kg)   SpO2 100%   BMI 30.98 kg/m        Objective:   Physical Exam  General Mental Status- Alert. General Appearance- Not in acute distress.   Skin General: Color- Normal Color. Moisture- Normal Moisture.  Neck Carotid Arteries- Normal color. Moisture- Normal Moisture. No carotid bruits. No JVD.  Chest and Lung Exam Auscultation: Breath Sounds:-Normal.  Cardiovascular Auscultation:Rythm- Regular. Murmurs & Other Heart Sounds:Auscultation of the heart reveals- No Murmurs.  Abdomen Inspection:-Inspeection Normal. Palpation/Percussion:Note:No mass. Palpation and Percussion of the abdomen reveal- Non Tender, Non Distended + BS, no rebound or guarding.   Neurologic Cranial Nerve exam:- CN III-XII intact(No nystagmus), symmetric smile. Strength:- 5/5 equal and symmetric strength both upper and lower extremities.    Heent- faint maxillary sinus pressure and left tm dull. Posterior pharynx mild dull red. Faint hypertrophy.     Assessment & Plan:   Patient Instructions  Your strep test was positive. I am prescribing azithromycin antibiotic.(You question pcn side effect in past so rx azithromycin). You have some redness to ear and sinus pressure. If symptoms persist past antibiotic let me now. Rest hydrate, tylenol for fever and warm salt water gargles.   Rx benzonatate for cough.  Follow up in 7 days or as needed.     Mackie Pai, PA-C

## 2022-05-20 ENCOUNTER — Encounter: Payer: Self-pay | Admitting: Family Medicine

## 2022-05-21 ENCOUNTER — Other Ambulatory Visit: Payer: Self-pay | Admitting: Family Medicine

## 2022-05-21 ENCOUNTER — Ambulatory Visit (HOSPITAL_BASED_OUTPATIENT_CLINIC_OR_DEPARTMENT_OTHER)
Admission: RE | Admit: 2022-05-21 | Discharge: 2022-05-21 | Disposition: A | Discharge status: Home / Self Care | Payer: No Typology Code available for payment source | Source: Ambulatory Visit | Attending: Family Medicine | Admitting: Family Medicine

## 2022-05-21 DIAGNOSIS — M19071 Primary osteoarthritis, right ankle and foot: Secondary | ICD-10-CM | POA: Diagnosis present

## 2022-05-21 DIAGNOSIS — M25571 Pain in right ankle and joints of right foot: Secondary | ICD-10-CM

## 2022-05-21 DIAGNOSIS — M79671 Pain in right foot: Secondary | ICD-10-CM

## 2022-05-23 ENCOUNTER — Telehealth: Payer: Self-pay | Admitting: Family Medicine

## 2022-05-23 NOTE — Telephone Encounter (Signed)
Patient called wanting an update on his imaging results, he states he has physical therapy on Tuesday next week and needs to know the results by then. Please advise.

## 2022-05-23 NOTE — Telephone Encounter (Signed)
Advise patient results are back and we are just waiting on Dr. Charlett Blake to comment on them.  Advised patient to keep a look out for Dr. Rhae Lerner comments on mychart.

## 2022-05-23 NOTE — Telephone Encounter (Signed)
Pt called back to find out about his imaging results on his foot. Advised that radiologist still has not read the results for Dr. Charlett Blake to review. Pt has Physical therapy appt on Tuesday and is curious to know what is going on in preparation of that visit. Please call pt to advise. He understands this may not be until Monday.

## 2022-06-25 ENCOUNTER — Ambulatory Visit (INDEPENDENT_AMBULATORY_CARE_PROVIDER_SITE_OTHER): Payer: No Typology Code available for payment source | Admitting: Podiatry

## 2022-06-25 ENCOUNTER — Telehealth: Payer: Self-pay | Admitting: Podiatry

## 2022-06-25 DIAGNOSIS — M76821 Posterior tibial tendinitis, right leg: Secondary | ICD-10-CM

## 2022-06-25 DIAGNOSIS — Q666 Other congenital valgus deformities of feet: Secondary | ICD-10-CM

## 2022-06-25 DIAGNOSIS — M7671 Peroneal tendinitis, right leg: Secondary | ICD-10-CM | POA: Diagnosis not present

## 2022-06-25 NOTE — Telephone Encounter (Signed)
Patient called he is concerned that the order states a bilateral quarter inch lift and his right side is a quarter inch shorter than the left, how is that going to effect him walking ? Please call

## 2022-06-25 NOTE — Progress Notes (Signed)
Subjective:  Patient ID: Jared Myers, male    DOB: Jul 08, 1980,  MRN: [id number]  Chief Complaint  Patient presents with   Foot Pain    Pt stated that he has been doing some physical therapy which seems to be helping more than the boot did     42 y.o. male presents with the above complaint.  Patient presents with right posterior tibial tendinitis pain.  Patient states been going for quite some time for a month.  Has progressive gotten worse worse with ambulation worse with pressure when to get it evaluated he is constantly on his foot.  He notices a lot of pain when he is on his feet.  He has not seen anyone as prior to seeing me.  Pain scale is 5 out of 10 sharp shooting in nature.   Review of Systems: Negative except as noted in the HPI. Denies N/V/F/Ch.  Past Medical History:  Diagnosis Date   Anxiety in acute stress reaction 01/06/2011   Constipation 12/18/2013   Hyperglycemia 09/28/2014   Hyperlipidemia, mixed 06/22/2014   Infectious mononucleosis 07/18/2013   Inguinal hernia 05/12/2012   Low back pain 12/16/2013   Overweight 06/22/2014   Pelvic floor dysfunction    Premature ventricular beat    Splenomegaly 07/18/2013   Tremulousness 08/14/2013    Current Outpatient Medications:    albuterol (VENTOLIN HFA) 108 (90 Base) MCG/ACT inhaler, TAKE 2 PUFFS BY MOUTH EVERY 6 HOURS AS NEEDED FOR WHEEZE OR SHORTNESS OF BREATH, Disp: 8.5 each, Rfl: 0   benzonatate (TESSALON) 100 MG capsule, Take 1 capsule (100 mg total) by mouth 3 (three) times daily as needed for cough., Disp: 30 capsule, Rfl: 0   levocetirizine (XYZAL) 5 MG tablet, Take 1 tablet (5 mg total) by mouth every evening., Disp: 30 tablet, Rfl: 3   tiZANidine (ZANAFLEX) 4 MG tablet, Take 1 tablet (4 mg total) by mouth every 8 (eight) hours as needed for muscle spasms., Disp: 60 tablet, Rfl: 3   Vitamin D, Ergocalciferol, (DRISDOL) 1.25 MG (50000 UNIT) CAPS capsule, Take 1 capsule (50,000 Units total) by mouth every 7 (seven) days.,  Disp: 5 capsule, Rfl: 4  Social History   Tobacco Use  Smoking Status Never  Smokeless Tobacco Never    Allergies  Allergen Reactions   Guaifenesin     REACTION: PVCs   Prednisone     PVC   Objective:  There were no vitals filed for this visit. There is no height or weight on file to calculate BMI. Constitutional Well developed. Well nourished.  Vascular Dorsalis pedis pulses palpable bilaterally. Posterior tibial pulses palpable bilaterally. Capillary refill normal to all digits.  No cyanosis or clubbing noted. Pedal hair growth normal.  Neurologic Normal speech. Oriented to person, place, and time. Epicritic sensation to light touch grossly present bilaterally.  Dermatologic Nails well groomed and normal in appearance. No open wounds. No skin lesions.  Orthopedic: Right pain on palpation along the course of the posterior tibial tendon pain at the insertion.  Pain with resisted plantarflexion inversion of the foot.  No pain with dorsiflexion and eversion of the foot no pain on the course of peroneal tendon Achilles tendon ATFL ligament   Radiographs: None Assessment:   1. Pes planovalgus   2. Posterior tibial tendinitis, right   3. Peroneal tendinitis, right     Plan:  Patient was evaluated and treated and all questions answered.  Right posterior tibial tendinitis/peroneal tendinitis -All questions and concerns were discussed with the patient  in extensive detail  -Patient is currently doing physical therapy which seems to be helping I encouraged him to continue doing physical therapy -Patient will transition out of cam boot into a Tri-Lock ankle brace -Tri-Lock ankle brace was dispensed  Pes planovalgus -I explained to patient the etiology of pes planovalgus and relationship with Planter fasciitis and various treatment options were discussed.  Given patient foot structure in the setting of Planter fasciitis I believe patient will benefit from custom-made orthotics  to help control the hindfoot motion support the arch of the foot and take the stress away from plantar fascial.  Patient agrees with the plan like to proceed with orthotics -Patient was casted for orthotics with 1/4 inch heel lift   No follow-ups on file.

## 2022-07-21 ENCOUNTER — Ambulatory Visit (INDEPENDENT_AMBULATORY_CARE_PROVIDER_SITE_OTHER): Payer: No Typology Code available for payment source

## 2022-07-21 DIAGNOSIS — Q666 Other congenital valgus deformities of feet: Secondary | ICD-10-CM

## 2022-07-21 NOTE — Progress Notes (Signed)
Patient presents today to pick up custom molded foot orthotics recommended by Dr. PATEL.   Orthotics were dispensed and fit was satisfactory. Reviewed instructions for break-in and wear. Written instructions given to patient.  Patient will follow up as needed.   

## 2022-09-01 NOTE — Assessment & Plan Note (Signed)
Encourage heart healthy diet such as MIND or DASH diet, increase exercise, avoid trans fats, simple carbohydrates and processed foods, consider a krill or fish or flaxseed oil cap daily.  °

## 2022-09-01 NOTE — Assessment & Plan Note (Signed)
Supplement and monitor 

## 2022-09-01 NOTE — Progress Notes (Signed)
Subjective:    Patient ID: Jared Myers, male    DOB: 16-May-1980, 41 y.o.   MRN: [id number]  Chief Complaint  Patient presents with   Follow-up    Follow up    HPI Discussed the use of AI scribe software for clinical note transcription with the patient, who gave verbal consent to proceed.  History of Present Illness   The patient, with a history of foot pain, presents with a persistent cough since April after a trip to [place] where they contracted the flu. The cough has not resolved and has been associated with increased congestion and expectoration of brown sputum over the past week. They deny high fevers, chills, headaches, and ear pain, but report recent onset of throat pain.  They also report steady chest pain that improves with chiropractic manipulation but then returns. They deny associated dyspnea, palpitations, diaphoresis, or nausea.  Additionally, they have been experiencing diffuse abdominal pain that can shift from one side to the other, associated with bloating and a change in bowel habits. They deny hematochezia, melena, or steatorrhea.  The patient also reports fatigue, which may be improving slightly, but they continue to get sick. They are due for a check of their vitamin D and B12 levels, as they have had deficiencies in the past.  They also have a prominent lymph node that has been evaluated previously and is not concerning.    Patient is a 42 year old male in today for follow up on chronic medical concerns. No recent febrile illness or hospitalizations. Denies CP/palp/SOB/HA/congestion/fevers/GI or GU c/o. Taking meds as prescribed     Past Medical History:  Diagnosis Date   Anxiety in acute stress reaction 01/06/2011   Constipation 12/18/2013   Hyperglycemia 09/28/2014   Hyperlipidemia, mixed 06/22/2014   Infectious mononucleosis 07/18/2013   Inguinal hernia 05/12/2012   Low back pain 12/16/2013   Overweight 06/22/2014   Pelvic floor dysfunction    Premature  ventricular beat    Splenomegaly 07/18/2013   Tremulousness 08/14/2013    Past Surgical History:  Procedure Laterality Date   NO PAST SURGERIES      Family History  Problem Relation Age of Onset   Cancer Mother        Non Hodkins lymphoma, breast cancer   Other Father        pituatary adenoma   Cancer Paternal Uncle        recurrent prostate    Heart disease Maternal Grandmother        chf   Heart disease Maternal Grandfather        chf   Heart disease Paternal Grandfather        chf   Depression Brother    Anemia Sister     Social History   Socioeconomic History   Marital status: Married    Spouse name: Not on file   Number of children: Not on file   Years of education: Not on file   Highest education level: Some college, no degree  Occupational History   Not on file  Tobacco Use   Smoking status: Never   Smokeless tobacco: Never  Substance and Sexual Activity   Alcohol use: No   Drug use: No   Sexual activity: Yes    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, works at post office, 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  Other Topics Concern   Not on file  Social History Narrative   New baby on the way 07/15/16   Wife  is 13 weeks   Social Determinants of Health   Financial Resource Strain: Medium Risk (09/02/2022)   Overall Financial Resource Strain (CARDIA)    Difficulty of Paying Living Expenses: Somewhat hard  Food Insecurity: No Food Insecurity (09/02/2022)   Hunger Vital Sign    Worried About Running Out of Food in the Last Year: Never true    Ran Out of Food in the Last Year: Never true  Transportation Needs: No Transportation Needs (09/02/2022)   PRAPARE - Administrator, Civil Service (Medical): No    Lack of Transportation (Non-Medical): No  Physical Activity: Insufficiently Active (09/02/2022)   Exercise Vital Sign    Days of Exercise per Week: 2 days    Minutes of Exercise per Session: 30 min  Stress: Stress Concern Present (09/02/2022)   [employer] of Occupational [employer] - Occupational Stress Questionnaire    Feeling of Stress : To some extent  Social Connections: Socially Integrated (09/02/2022)   Social Connection and Isolation Panel [NHANES]    Frequency of Communication with Friends and Family: More than three times a week    Frequency of Social Gatherings with Friends and Family: Three times a week    Attends Religious Services: More than 4 times per year    Active Member of Clubs or Organizations: Yes    Attends Engineer, structural: More than 4 times per year    Marital Status: Married  Catering manager Violence: Not on file    Outpatient Medications Prior to Visit  Medication Sig Dispense Refill   albuterol (VENTOLIN HFA) 108 (90 Base) MCG/ACT inhaler TAKE 2 PUFFS BY MOUTH EVERY 6 HOURS AS NEEDED FOR WHEEZE OR SHORTNESS OF BREATH 8.5 each 0   benzonatate (TESSALON) 100 MG capsule Take 1 capsule (100 mg total) by mouth 3 (three) times daily as needed for cough. 30 capsule 0   levocetirizine (XYZAL) 5 MG tablet Take 1 tablet (5 mg total) by mouth every evening. 30 tablet 3   tiZANidine (ZANAFLEX) 4 MG tablet Take 1 tablet (4 mg total) by mouth every 8 (eight) hours as needed for muscle spasms. 60 tablet 3   Vitamin D, Ergocalciferol, (DRISDOL) 1.25 MG (50000 UNIT) CAPS capsule Take 1 capsule (50,000 Units total) by mouth every 7 (seven) days. 5 capsule 4   No facility-administered medications prior to visit.    Allergies  Allergen Reactions   Guaifenesin     REACTION: PVCs   Prednisone     PVC    Review of Systems  Constitutional:  Negative for fever and malaise/fatigue.  HENT:  Positive for congestion.   Eyes:  Negative for blurred vision.  Respiratory:  Positive for cough and sputum production. Negative for shortness of breath.   Cardiovascular:  Negative for chest pain, palpitations and leg swelling.  Gastrointestinal:  Positive for abdominal pain. Negative for blood in stool, melena, nausea and  vomiting.  Genitourinary:  Negative for dysuria and frequency.  Musculoskeletal:  Positive for joint pain. Negative for falls.  Skin:  Negative for rash.  Neurological:  Negative for dizziness, loss of consciousness and headaches.  Endo/Heme/Allergies:  Negative for environmental allergies.  Psychiatric/Behavioral:  Negative for depression. The patient is not nervous/anxious.        Objective:    Physical Exam Vitals reviewed.  Constitutional:      Appearance: Normal appearance. He is not ill-appearing.  HENT:     Head: Normocephalic and atraumatic.     Nose: Nose normal.  Eyes:     Conjunctiva/sclera: Conjunctivae normal.  Cardiovascular:     Rate and Rhythm: Normal rate.     Pulses: Normal pulses.     Heart sounds: Normal heart sounds. No murmur heard. Pulmonary:     Effort: Pulmonary effort is normal.     Breath sounds: Normal breath sounds. No wheezing.  Abdominal:     Palpations: Abdomen is soft. There is no mass.     Tenderness: There is no abdominal tenderness.  Musculoskeletal:     Cervical back: Normal range of motion.     Right lower leg: No edema.     Left lower leg: No edema.  Skin:    General: Skin is warm and dry.  Neurological:     General: No focal deficit present.     Mental Status: He is alert and oriented to person, place, and time.  Psychiatric:        Mood and Affect: Mood normal.    BP 126/80 (BP Location: Left Arm, Patient Position: Sitting, Cuff Size: Normal)   Pulse 85   Temp 97.8 F (36.6 C) (Oral)   Resp 16   Ht 5\' 7"  (1.702 m)   Wt 197 lb 12.8 oz (89.7 kg)   SpO2 96%   BMI 30.98 kg/m  Wt Readings from Last 3 Encounters:  09/02/22 197 lb 12.8 oz (89.7 kg)  05/19/22 197 lb 12.8 oz (89.7 kg)  05/05/22 196 lb 9.6 oz (89.2 kg)    Diabetic Foot Exam - Simple   No data filed    Lab Results  Component Value Date   WBC 8.3 09/02/2022   HGB 15.2 09/02/2022   HCT 45.2 09/02/2022   PLT 299.0 09/02/2022   GLUCOSE 86 09/02/2022    CHOL 187 09/02/2022   TRIG 133.0 09/02/2022   HDL 34.80 (L) 09/02/2022   LDLDIRECT 120.0 07/23/2020   LDLCALC 125 (H) 09/02/2022   ALT 20 09/02/2022   AST 17 09/02/2022   NA 137 09/02/2022   K 4.5 09/02/2022   CL 100 09/02/2022   CREATININE 1.06 09/02/2022   BUN 13 09/02/2022   CO2 28 09/02/2022   TSH 1.14 09/02/2022   PSA 0.52 01/24/2014   HGBA1C 5.2 09/02/2022    Lab Results  Component Value Date   TSH 1.14 09/02/2022   Lab Results  Component Value Date   WBC 8.3 09/02/2022   HGB 15.2 09/02/2022   HCT 45.2 09/02/2022   MCV 88.2 09/02/2022   PLT 299.0 09/02/2022   Lab Results  Component Value Date   NA 137 09/02/2022   K 4.5 09/02/2022   CO2 28 09/02/2022   GLUCOSE 86 09/02/2022   BUN 13 09/02/2022   CREATININE 1.06 09/02/2022   BILITOT 0.8 09/02/2022   ALKPHOS 70 09/02/2022   AST 17 09/02/2022   ALT 20 09/02/2022   PROT 7.4 09/02/2022   ALBUMIN 4.5 09/02/2022   CALCIUM 9.6 09/02/2022   ANIONGAP 7 06/06/2014   GFR 86.63 09/02/2022   Lab Results  Component Value Date   CHOL 187 09/02/2022   Lab Results  Component Value Date   HDL 34.80 (L) 09/02/2022   Lab Results  Component Value Date   LDLCALC 125 (H) 09/02/2022   Lab Results  Component Value Date   TRIG 133.0 09/02/2022   Lab Results  Component Value Date   CHOLHDL 5 09/02/2022   Lab Results  Component Value Date   HGBA1C 5.2 09/02/2022       Assessment &  Plan:  Hyperlipidemia, mixed Assessment & Plan: Encourage heart healthy diet such as MIND or DASH diet, increase exercise, avoid trans fats, simple carbohydrates and processed foods, consider a krill or fish or flaxseed oil cap daily.   Orders: -     Lipid panel -     TSH  Vitamin B12 deficiency Assessment & Plan: Supplement and monitor   Orders: -     Vitamin B12  Vitamin D deficiency Assessment & Plan: Supplement and monitor   Orders: -     VITAMIN D 25 Hydroxy (Vit-D Deficiency,  Fractures)  Hyperglycemia Assessment & Plan: hgba1c acceptable, minimize simple carbs. Increase exercise as tolerated.   Orders: -     Comprehensive metabolic panel -     Hemoglobin A1c -     TSH  Cough, unspecified type -     CBC with Differential/Platelet  Change in bowel function -     Ambulatory referral to Gastroenterology  Abdominal pain, unspecified abdominal location -     Lipase -     Amylase -     DG Abd 1 View; Future  Bronchitis Assessment & Plan: Has had a cough for several months but over the past week it has worsened. Given an rx for Doxycycline 100 mg po bid x 10 days and Hydromet prn   Other orders -     Promethazine-DM; Take 2.5-5 mLs by mouth 2 (two) times daily as needed for cough.  Dispense: 180 mL; Refill: 0    Assessment and Plan    Chronic Cough: Persistent since April, recently increased congestion and productive of brown sputum. Mild throat pain. No associated fever, chills, headache, or ear pain. -Consider further evaluation if symptoms persist.  Chest Pain: Stable, improves with chiropractic manipulation. No associated shortness of breath, palpitations, or diaphoresis. -Continue current management with chiropractic care.  Abdominal Pain: Diffuse, associated with bloating and change in bowel habits. No melena or steatorrhea. -Order abdominal x-ray to evaluate for constipation. -Order amylase and lipase to evaluate for pancreatic etiology.  Vitamin D Deficiency: History of deficiency, currently on supplementation. -Check Vitamin D level. -Continue current supplementation pending lab results.  General Health Maintenance / Followup Plans -Order routine labs including CBC, CMP, lipid panel, A1c, and B12. -Follow-up in 3 months to reassess ongoing issues.         Danise Edge, MD

## 2022-09-01 NOTE — Assessment & Plan Note (Signed)
hgba1c acceptable, minimize simple carbs. Increase exercise as tolerated.  

## 2022-09-02 ENCOUNTER — Telehealth: Payer: Self-pay | Admitting: Family Medicine

## 2022-09-02 ENCOUNTER — Other Ambulatory Visit: Payer: Self-pay

## 2022-09-02 ENCOUNTER — Ambulatory Visit: Payer: No Typology Code available for payment source | Admitting: Family Medicine

## 2022-09-02 ENCOUNTER — Other Ambulatory Visit (HOSPITAL_BASED_OUTPATIENT_CLINIC_OR_DEPARTMENT_OTHER): Payer: Self-pay

## 2022-09-02 ENCOUNTER — Ambulatory Visit (HOSPITAL_BASED_OUTPATIENT_CLINIC_OR_DEPARTMENT_OTHER)
Admission: RE | Admit: 2022-09-02 | Discharge: 2022-09-02 | Disposition: A | Discharge status: Home / Self Care | Payer: No Typology Code available for payment source | Source: Ambulatory Visit | Attending: Family Medicine | Admitting: Family Medicine

## 2022-09-02 ENCOUNTER — Encounter: Payer: Self-pay | Admitting: Family Medicine

## 2022-09-02 VITALS — BP 126/80 | HR 85 | Temp 97.8°F | Resp 16 | Ht 67.0 in | Wt 197.8 lb

## 2022-09-02 DIAGNOSIS — R739 Hyperglycemia, unspecified: Secondary | ICD-10-CM | POA: Diagnosis not present

## 2022-09-02 DIAGNOSIS — R109 Unspecified abdominal pain: Secondary | ICD-10-CM | POA: Diagnosis present

## 2022-09-02 DIAGNOSIS — E782 Mixed hyperlipidemia: Secondary | ICD-10-CM

## 2022-09-02 DIAGNOSIS — E559 Vitamin D deficiency, unspecified: Secondary | ICD-10-CM

## 2022-09-02 DIAGNOSIS — J40 Bronchitis, not specified as acute or chronic: Secondary | ICD-10-CM

## 2022-09-02 DIAGNOSIS — R059 Cough, unspecified: Secondary | ICD-10-CM

## 2022-09-02 DIAGNOSIS — E538 Deficiency of other specified B group vitamins: Secondary | ICD-10-CM | POA: Diagnosis not present

## 2022-09-02 DIAGNOSIS — R198 Other specified symptoms and signs involving the digestive system and abdomen: Secondary | ICD-10-CM

## 2022-09-02 LAB — CBC WITH DIFFERENTIAL/PLATELET
Basophils Absolute: 0 10*3/uL (ref 0.0–0.1)
Basophils Relative: 0.4 % (ref 0.0–3.0)
Eosinophils Absolute: 0.1 10*3/uL (ref 0.0–0.7)
Eosinophils Relative: 1.7 % (ref 0.0–5.0)
HCT: 45.2 % (ref 39.0–52.0)
Hemoglobin: 15.2 g/dL (ref 13.0–17.0)
Lymphocytes Relative: 26.1 % (ref 12.0–46.0)
Lymphs Abs: 2.2 10*3/uL (ref 0.7–4.0)
MCHC: 33.6 g/dL (ref 30.0–36.0)
MCV: 88.2 fl (ref 78.0–100.0)
Monocytes Absolute: 0.7 10*3/uL (ref 0.1–1.0)
Monocytes Relative: 8.3 % (ref 3.0–12.0)
Neutro Abs: 5.2 10*3/uL (ref 1.4–7.7)
Neutrophils Relative %: 63.5 % (ref 43.0–77.0)
Platelets: 299 10*3/uL (ref 150.0–400.0)
RBC: 5.13 Mil/uL (ref 4.22–5.81)
RDW: 13.6 % (ref 11.5–15.5)
WBC: 8.3 10*3/uL (ref 4.0–10.5)

## 2022-09-02 LAB — COMPREHENSIVE METABOLIC PANEL
ALT: 20 U/L (ref 0–53)
AST: 17 U/L (ref 0–37)
Albumin: 4.5 g/dL (ref 3.5–5.2)
Alkaline Phosphatase: 70 U/L (ref 39–117)
BUN: 13 mg/dL (ref 6–23)
CO2: 28 mEq/L (ref 19–32)
Calcium: 9.6 mg/dL (ref 8.4–10.5)
Chloride: 100 mEq/L (ref 96–112)
Creatinine, Ser: 1.06 mg/dL (ref 0.40–1.50)
GFR: 86.63 mL/min (ref >60.00)
Glucose, Bld: 86 mg/dL (ref 70–99)
Potassium: 4.5 mEq/L (ref 3.5–5.1)
Sodium: 137 mEq/L (ref 135–145)
Total Bilirubin: 0.8 mg/dL (ref 0.2–1.2)
Total Protein: 7.4 g/dL (ref 6.0–8.3)

## 2022-09-02 LAB — VITAMIN D 25 HYDROXY (VIT D DEFICIENCY, FRACTURES): VITD: 25.12 ng/mL — ABNORMAL LOW (ref 30.00–100.00)

## 2022-09-02 LAB — LIPID PANEL
Cholesterol: 187 mg/dL (ref 0–200)
HDL: 34.8 mg/dL — ABNORMAL LOW (ref >39.00)
LDL Cholesterol: 125 mg/dL — ABNORMAL HIGH (ref 0–99)
NonHDL: 152
Total CHOL/HDL Ratio: 5
Triglycerides: 133 mg/dL (ref 0.0–149.0)
VLDL: 26.6 mg/dL (ref 0.0–40.0)

## 2022-09-02 LAB — AMYLASE: Amylase: 35 U/L (ref 27–131)

## 2022-09-02 LAB — HEMOGLOBIN A1C: Hgb A1c MFr Bld: 5.2 % (ref 4.6–6.5)

## 2022-09-02 LAB — TSH: TSH: 1.14 u[IU]/mL (ref 0.35–5.50)

## 2022-09-02 LAB — VITAMIN B12: Vitamin B-12: 250 pg/mL (ref 211–911)

## 2022-09-02 LAB — LIPASE: Lipase: 28 U/L (ref 11.0–59.0)

## 2022-09-02 MED ORDER — PROMETHAZINE-DM 6.25-15 MG/5ML PO SYRP
2.5000 mL | ORAL_SOLUTION | Freq: Two times a day (BID) | ORAL | 0 refills | Status: DC | PRN
  Filled 2022-09-02 – 2022-09-03 (×2): qty 180, 18d supply, fill #0

## 2022-09-02 MED ORDER — DOXYCYCLINE HYCLATE 100 MG PO TABS
100.0000 mg | ORAL_TABLET | Freq: Two times a day (BID) | ORAL | 0 refills | Status: DC
  Filled 2022-09-02: qty 20, 10d supply, fill #0

## 2022-09-02 MED ORDER — DOXYCYCLINE HYCLATE 100 MG PO TABS: 100.0000 mg | ORAL_TABLET | Freq: Two times a day (BID) | ORAL | 0 refills | Status: DC

## 2022-09-02 NOTE — Assessment & Plan Note (Signed)
Has had a cough for several months but over the past week it has worsened. Given an rx for Doxycycline 100 mg po bid x 10 days and Hydromet prn

## 2022-09-02 NOTE — Telephone Encounter (Signed)
Pt stated since our pharmacy system went down he is not able to get rx and would like it sent to the [facility] in [place].

## 2022-09-02 NOTE — Telephone Encounter (Signed)
Medication was sent to [facility]

## 2022-09-02 NOTE — Patient Instructions (Signed)
Cough, Adult Coughing is a reflex that clears your throat and airways (respiratory system). It helps heal and protect your lungs. It is normal to cough from time to time. A cough that happens with other symptoms or that lasts a long time may be a sign of a condition that needs treatment. A short-term (acute) cough may only last 2-3 weeks. A long-term (chronic) cough may last 8 or more weeks. Coughing is often caused by: Diseases, such as: An infection of the respiratory system. Asthma or other heart or lung diseases. Gastroesophageal reflux. This is when acid comes back up from the stomach. Breathing in things that irritate your lungs. Allergies. Postnasal drip. This is when mucus runs down the back of your throat. Smoking. Some medicines. Follow these instructions at home: Medicines Take over-the-counter and prescription medicines only as told by your health care provider. Talk with your provider before you take cough medicine (cough suppressants). Eating and drinking Do not drink alcohol. Avoid caffeine. Drink enough fluid to keep your pee (urine) pale yellow. Lifestyle Avoid cigarette smoke. Do not use any products that contain nicotine or tobacco. These products include cigarettes, chewing tobacco, and vaping devices, such as e-cigarettes. If you need help quitting, ask your provider. Avoid things that make you cough. These may include perfumes, candles, cleaning products, or campfire smoke. General instructions  Watch for any changes to your cough. Tell your provider about them. Always cover your mouth when you cough. If the air is dry in your bedroom or home, use a cool mist vaporizer or humidifier. If your cough is worse at night, try to sleep in a semi-upright position. Rest as needed. Contact a health care provider if: You have new symptoms, or your symptoms get worse. You cough up pus. You have a fever that does not go away or a cough that does not get better after 2-3  weeks. You cannot control your cough with medicine, and you are losing sleep. You have pain that gets worse or is not helped with medicine. You lose weight for no clear reason. You have night sweats. Get help right away if: You cough up blood. You have trouble breathing. Your heart is beating very fast. These symptoms may be an emergency. Get help right away. Call 911. Do not wait to see if the symptoms will go away. Do not drive yourself to the hospital. This information is not intended to replace advice given to you by your health care provider. Make sure you discuss any questions you have with your health care provider. Document Revised: 10/25/2021 Document Reviewed: 10/25/2021 Elsevier Patient Education  2024 [employer] Inc.  

## 2022-09-03 ENCOUNTER — Other Ambulatory Visit (HOSPITAL_BASED_OUTPATIENT_CLINIC_OR_DEPARTMENT_OTHER): Payer: Self-pay

## 2022-09-03 ENCOUNTER — Other Ambulatory Visit: Payer: Self-pay

## 2022-09-03 MED ORDER — ALBUTEROL SULFATE HFA 108 (90 BASE) MCG/ACT IN AERS: INHALATION_SPRAY | RESPIRATORY_TRACT | 0 refills | Status: DC

## 2022-09-03 MED ORDER — BENZONATATE 100 MG PO CAPS: 100.0000 mg | ORAL_CAPSULE | Freq: Three times a day (TID) | ORAL | 0 refills | Status: DC | PRN

## 2022-09-03 NOTE — Telephone Encounter (Signed)
Pt stated he wanted doxycycline and promethazine-dextromethorphan sent to [facility] but only received doxycycline . Then got a notification that benzonatate and albuterol were sent in. He is still missing the promethazine cough syrup and is not sure if pcp wanted him to take albuterol and benzonatate as that was not discussed. He is hoping this can be fixed today as he is not feeling well. Please advise.

## 2022-10-23 ENCOUNTER — Encounter (INDEPENDENT_AMBULATORY_CARE_PROVIDER_SITE_OTHER): Payer: Self-pay

## 2022-12-02 ENCOUNTER — Other Ambulatory Visit (HOSPITAL_BASED_OUTPATIENT_CLINIC_OR_DEPARTMENT_OTHER): Payer: Self-pay

## 2022-12-02 ENCOUNTER — Ambulatory Visit: Payer: No Typology Code available for payment source | Admitting: Physician Assistant

## 2022-12-02 ENCOUNTER — Encounter: Payer: Self-pay | Admitting: Family Medicine

## 2022-12-02 ENCOUNTER — Encounter: Payer: Self-pay | Admitting: Physician Assistant

## 2022-12-02 ENCOUNTER — Telehealth: Payer: Self-pay

## 2022-12-02 VITALS — BP 128/86 | HR 77 | Temp 98.5°F | Resp 16 | Ht 67.0 in | Wt 195.5 lb

## 2022-12-02 DIAGNOSIS — S61032A Puncture wound without foreign body of left thumb without damage to nail, initial encounter: Secondary | ICD-10-CM | POA: Diagnosis not present

## 2022-12-02 DIAGNOSIS — R110 Nausea: Secondary | ICD-10-CM | POA: Diagnosis not present

## 2022-12-02 DIAGNOSIS — Z23 Encounter for immunization: Secondary | ICD-10-CM | POA: Diagnosis not present

## 2022-12-02 MED ORDER — ONDANSETRON HCL 4 MG PO TABS
4.0000 mg | ORAL_TABLET | Freq: Three times a day (TID) | ORAL | 0 refills | Status: DC | PRN
  Filled 2022-12-02: qty 20, 7d supply, fill #0

## 2022-12-02 NOTE — Telephone Encounter (Signed)
Pt called about wound/cut and not treated with antibiotic. Was concerned. Pt scheduled for appt with Lillia Abed, [place], he was seen at urgent care for Drill into Thumb // Wound was packed by UC but no stiches were done.

## 2022-12-02 NOTE — Progress Notes (Signed)
Established patient visit   Patient: Jared Myers   DOB: 05/08/1980   42 y.o. Male  MRN: [id number] Visit Date: 12/02/2022  Today's healthcare provider: Alfredia Ferguson, PA-C   Cc. Puncture wound  Subjective    HPI   Pt reports yesterday he was hanging a flag and accidentally drilled through his left thumb with a rusty drill bit. He presented to urgent care who reportedly 'packed' the area. He presents today worried about infection and tetanus. Pt reports he has been nauseous. His last tetanus shot was 2018.  Medications: Outpatient Medications Prior to Visit  Medication Sig   tiZANidine (ZANAFLEX) 4 MG tablet Take 1 tablet (4 mg total) by mouth every 8 (eight) hours as needed for muscle spasms.   albuterol (VENTOLIN HFA) 108 (90 Base) MCG/ACT inhaler TAKE 2 PUFFS BY MOUTH EVERY 6 HOURS AS NEEDED FOR WHEEZE OR SHORTNESS OF BREATH (Patient not taking: Reported on 12/02/2022)   benzonatate (TESSALON) 100 MG capsule Take 1 capsule (100 mg total) by mouth 3 (three) times daily as needed for cough. (Patient not taking: Reported on 12/02/2022)   doxycycline (VIBRA-TABS) 100 MG tablet Take 1 tablet (100 mg total) by mouth 2 (two) times daily. (Patient not taking: Reported on 12/02/2022)   levocetirizine (XYZAL) 5 MG tablet Take 1 tablet (5 mg total) by mouth every evening. (Patient not taking: Reported on 12/02/2022)   promethazine-dextromethorphan (PROMETHAZINE-DM) 6.25-15 MG/5ML syrup Take 2.5-5 mLs by mouth 2 (two) times daily as needed for cough. (Patient not taking: Reported on 12/02/2022)   Vitamin D, Ergocalciferol, (DRISDOL) 1.25 MG (50000 UNIT) CAPS capsule Take 1 capsule (50,000 Units total) by mouth every 7 (seven) days. (Patient not taking: Reported on 12/02/2022)   No facility-administered medications prior to visit.    Review of Systems  Constitutional:  Negative for fatigue and fever.  Respiratory:  Negative for cough and shortness of breath.   Cardiovascular:  Negative  for chest pain, palpitations and leg swelling.  Skin:  Positive for wound.  Neurological:  Negative for dizziness and headaches.      Objective    BP 128/86   Pulse 77   Temp 98.5 F (36.9 C) (Oral)   Resp 16   Ht 5\' 7"  (1.702 m)   Wt 195 lb 8 oz (88.7 kg)   SpO2 99%   BMI 30.62 kg/m   Physical Exam Vitals reviewed.  Constitutional:      Appearance: He is not ill-appearing.  HENT:     Head: Normocephalic.  Eyes:     Conjunctiva/sclera: Conjunctivae normal.  Cardiovascular:     Rate and Rhythm: Normal rate.  Pulmonary:     Effort: Pulmonary effort is normal. No respiratory distress.  Skin:    Comments: Left thumb with a ~1 cm wound, well approximated. There is a FB adhered to the top of the wound. No associated edema, erythema.   Neurological:     General: No focal deficit present.     Mental Status: He is alert and oriented to person, place, and time.  Psychiatric:        Mood and Affect: Mood normal.        Behavior: Behavior normal.      No results found for any visits on 12/02/22.  Assessment & Plan     1. Need for Td vaccine Given high risk wound, will update Td today - Td : Tetanus/diphtheria >7yo Preservative  free  2. Nausea 2/2 pain. Rx zofran -  ondansetron (ZOFRAN) 4 MG tablet; Take 1 tablet (4 mg total) by mouth every 8 (eight) hours as needed for nausea or vomiting.  Dispense: 20 tablet; Refill: 0  3. Puncture wound of left thumb, initial encounter Given pt's initial reports of the wound being packed I was concerned over the foreign body that was adhered to his wound.   I used saline to soften the scab, and the foreign substance dissolved. On review of common wound care materials with the patient, this appears to have been a steri strip or something similar. On further investigation of the wound no apparent packing. It stays well approximated with no bleeding. Area was re-dressed.  Pt advised to keep it dry, and to clean daily with soap and  water.  Advised what signs of infection would look like.  Return if symptoms worsen or fail to improve.      I, Alfredia Ferguson, PA-C have reviewed all documentation for this visit. The documentation on  12/02/22   for the exam, diagnosis, procedures, and orders are all accurate and complete.    Alfredia Ferguson, PA-C  [facility] [phone] (phone) [phone] (fax)  [facility]

## 2022-12-08 ENCOUNTER — Ambulatory Visit: Payer: No Typology Code available for payment source | Admitting: Family Medicine

## 2022-12-19 ENCOUNTER — Telehealth (INDEPENDENT_AMBULATORY_CARE_PROVIDER_SITE_OTHER): Payer: No Typology Code available for payment source | Admitting: Medical

## 2022-12-19 DIAGNOSIS — J302 Other seasonal allergic rhinitis: Secondary | ICD-10-CM

## 2022-12-19 DIAGNOSIS — J3489 Other specified disorders of nose and nasal sinuses: Secondary | ICD-10-CM

## 2022-12-19 MED ORDER — FLUTICASONE PROPIONATE 50 MCG/ACT NA SUSP: 2.0000 | Freq: Every day | NASAL | 1 refills | Status: DC

## 2022-12-19 MED ORDER — LEVOCETIRIZINE DIHYDROCHLORIDE 5 MG PO TABS: 5.0000 mg | ORAL_TABLET | Freq: Every evening | ORAL | 3 refills | Status: DC

## 2022-12-19 MED ORDER — AZITHROMYCIN 250 MG PO TABS
ORAL_TABLET | ORAL | 0 refills | Status: AC
Start: 2022-12-19 — End: 2022-12-24

## 2022-12-19 NOTE — Progress Notes (Signed)
   Subjective:    Patient ID: Jared Myers, male    DOB: 01/26/1981, 42 y.o.   MRN: [id number]  HPI  Virtual Visit via Video Note  I connected with Jared Myers on 12/19/22 at  3:20 PM EDT by a video enabled telemedicine application and verified that I am speaking with the correct person using two identifiers.  Location: Patient: home Provider: office   I discussed the limitations of evaluation and management by telemedicine and the availability of in person appointments. The patient expressed understanding and agreed to proceed.  History of Present Illness: Pt states this wed got nasal congested. Wife also nasal congested. Pt has rt side frontal and ethmoid sinus region pain. Pt children are also sick. Pt does have hx of allergic rhinitis and sinus infections.  Pt has not been on xyzal recently. Pt flonase is expired.   Some colored mucus this morning.  No fever, no chills or sweats. Left submandibular node mid tender.   No myalgia  Pt did  no check bp, pulse or t. If find supplies    Observations/Objective:  General-no acute distress, pleasant, oriented. Lungs- on inspection lungs appear unlabored. Neck- no tracheal deviation or jvd on inspection. Neuro- gross motor function appears intact.  Heent- frontal and ethmoid sinus pressure rt side on self palpation.   Assessment and Plan: Patient Instructions  Seasonal allergic rhinitis with  Sinus pressure(possible sinus infection early)  -start xyzal tabs and flonase nasal spray in early morning and can use saline spray late afternon -Rx azithromycin antibiotic to use if by next week sinus pressure persists.  Follow up 10 days or sooner if needed.     Esperanza Richters, PA-C     I discussed the assessment and treatment plan with the patient. The patient was provided an opportunity to ask questions and all were answered. The patient agreed with the plan and demonstrated an understanding of the instructions.   The  patient was advised to call back or seek an in-person evaluation if the symptoms worsen or if the condition fails to improve as anticipated.     Esperanza Richters, PA-C   Review of Systems     Objective:   Physical Exam        Assessment & Plan:

## 2022-12-20 NOTE — Patient Instructions (Signed)
Seasonal allergic rhinitis with  Sinus pressure(possible sinus infection early)  -start xyzal tabs and flonase nasal spray in early morning and can use saline spray late afternon -Rx azithromycin antibiotic to use if by next week sinus pressure persists.  Follow up 10 days or sooner if needed.

## 2023-02-01 NOTE — Assessment & Plan Note (Deleted)
Encourage heart healthy diet such as MIND or DASH diet, increase exercise, avoid trans fats, simple carbohydrates and processed foods, consider a krill or fish or flaxseed oil cap daily.  °

## 2023-02-01 NOTE — Assessment & Plan Note (Deleted)
Supplement and monitor 

## 2023-02-01 NOTE — Assessment & Plan Note (Deleted)
hgba1c acceptable, minimize simple carbs. Increase exercise as tolerated.  

## 2023-02-02 ENCOUNTER — Ambulatory Visit: Payer: No Typology Code available for payment source | Admitting: Family Medicine

## 2023-02-02 ENCOUNTER — Encounter: Payer: Self-pay | Admitting: Family Medicine

## 2023-02-02 VITALS — BP 111/75 | HR 90 | Temp 98.0°F | Ht 67.0 in | Wt 195.0 lb

## 2023-02-02 DIAGNOSIS — R110 Nausea: Secondary | ICD-10-CM | POA: Diagnosis not present

## 2023-02-02 DIAGNOSIS — R1031 Right lower quadrant pain: Secondary | ICD-10-CM

## 2023-02-02 LAB — COMPREHENSIVE METABOLIC PANEL
ALT: 19 U/L (ref 0–53)
AST: 15 U/L (ref 0–37)
Albumin: 4.7 g/dL (ref 3.5–5.2)
Alkaline Phosphatase: 67 U/L (ref 39–117)
BUN: 12 mg/dL (ref 6–23)
CO2: 29 meq/L (ref 19–32)
Calcium: 9.9 mg/dL (ref 8.4–10.5)
Chloride: 102 meq/L (ref 96–112)
Creatinine, Ser: 1.13 mg/dL (ref 0.40–1.50)
GFR: 79.99 mL/min (ref >60.00)
Glucose, Bld: 96 mg/dL (ref 70–99)
Potassium: 3.8 meq/L (ref 3.5–5.1)
Sodium: 140 meq/L (ref 135–145)
Total Bilirubin: 0.7 mg/dL (ref 0.2–1.2)
Total Protein: 7.4 g/dL (ref 6.0–8.3)

## 2023-02-02 LAB — POC URINALSYSI DIPSTICK (AUTOMATED)
Bilirubin, UA: NEGATIVE
Blood, UA: NEGATIVE
Glucose, UA: NEGATIVE
Ketones, UA: NEGATIVE
Leukocytes, UA: NEGATIVE
Nitrite, UA: NEGATIVE
Protein, UA: NEGATIVE
Spec Grav, UA: 1.015 (ref 1.010–1.025)
Urobilinogen, UA: 0.2 U/dL
pH, UA: 7 (ref 5.0–8.0)

## 2023-02-02 LAB — LIPASE: Lipase: 34 U/L (ref 11.0–59.0)

## 2023-02-02 MED ORDER — ONDANSETRON 8 MG PO TBDP
8.0000 mg | ORAL_TABLET | Freq: Three times a day (TID) | ORAL | 0 refills | Status: DC | PRN
Start: 2023-02-02 — End: 2023-03-05

## 2023-02-02 NOTE — Progress Notes (Signed)
Acute Office Visit  Subjective:     Patient ID: Jared Myers, male    DOB: 1981-03-01, 42 y.o.   MRN: [id number]  Chief Complaint  Patient presents with   Abdominal Pain     Patient is in today for  abdominal pain, nausea.    Discussed the use of AI scribe software for clinical note transcription with the patient, who gave verbal consent to proceed.  History of Present Illness   The patient, with a known history of pelvic floor dysfunction, presents with a four-day history of abdominal pain and nausea. The pain was initially slight and was associated with prolonged driving. The patient also reported a change in bowel habits, with stools being looser and more frequent than usual, and a strong odor to his urine. The pain, initially thought to be related to his pelvic floor dysfunction, has been different in character and location, being more to the right and lower abdomen. The pain has been described as throbbing and has been moving around the abdomen, with the right lower quadrant being the most painful and consistent. The patient has also been experiencing increased flatulence. Despite these symptoms, the patient has not had a fever and has been able to eat, although he has reported a lack of interest in food due to the smell. He has not noticed any triggers or timing of the more severe pain episodes. The patient has not vomited. The patient's symptoms have been severe enough to disrupt his sleep. The patient has been feeling tired but denies any body aches or chills.           All review of systems negative except what is listed in the HPI      Objective:    BP 111/75   Pulse 90   Temp 98 F (36.7 C) (Oral)   Ht 5\' 7"  (1.702 m)   Wt 195 lb (88.5 kg)   SpO2 96%   BMI 30.54 kg/m    Physical Exam Vitals reviewed.  Constitutional:      Appearance: Normal appearance. He is obese.  Cardiovascular:     Rate and Rhythm: Normal rate and regular rhythm.     Heart sounds:  Normal heart sounds.  Pulmonary:     Effort: Pulmonary effort is normal.     Breath sounds: Normal breath sounds.  Abdominal:     Tenderness: There is abdominal tenderness in the right lower quadrant and periumbilical area. There is no guarding or rebound. Positive signs include McBurney's sign. Negative signs include Murphy's sign.  Skin:    General: Skin is warm and dry.  Neurological:     Mental Status: He is alert and oriented to person, place, and time.  Psychiatric:        Mood and Affect: Mood normal.        Behavior: Behavior normal.        Thought Content: Thought content normal.        Judgment: Judgment normal.     No results found for any visits on 02/02/23.      Assessment & Plan:   Problem List Items Addressed This Visit   None Visit Diagnoses     Right lower quadrant abdominal pain    -  Primary   Relevant Medications   ondansetron (ZOFRAN-ODT) 8 MG disintegrating tablet   Other Relevant Orders   CBC with Differential/Platelet   Comprehensive metabolic panel   Lipase   CT ABDOMEN PELVIS W CONTRAST   Nausea  Relevant Medications   ondansetron (ZOFRAN-ODT) 8 MG disintegrating tablet   Other Relevant Orders   CBC with Differential/Platelet   Comprehensive metabolic panel   Lipase   CT ABDOMEN PELVIS W CONTRAST      UA negative  Discussed options with patient. Labs and CT abd ordered. Sending in Zofran for nausea. Discussed supportive measures Patient aware of signs/symptoms requiring further/urgent evaluation.     Meds ordered this encounter  Medications   ondansetron (ZOFRAN-ODT) 8 MG disintegrating tablet    Sig: Take 1 tablet (8 mg total) by mouth every 8 (eight) hours as needed for nausea or vomiting.    Dispense:  20 tablet    Refill:  0    Order Specific Question:   Supervising Provider    Answer:   Danise Edge A [[id number]]    Return if symptoms worsen or fail to improve.  Clayborne Dana, NP

## 2023-02-03 ENCOUNTER — Ambulatory Visit: Payer: No Typology Code available for payment source | Admitting: Family Medicine

## 2023-02-03 ENCOUNTER — Ambulatory Visit (HOSPITAL_BASED_OUTPATIENT_CLINIC_OR_DEPARTMENT_OTHER)
Admission: RE | Admit: 2023-02-03 | Discharge: 2023-02-03 | Disposition: A | Discharge status: Home / Self Care | Payer: No Typology Code available for payment source | Source: Ambulatory Visit | Attending: Family Medicine | Admitting: Family Medicine

## 2023-02-03 DIAGNOSIS — R110 Nausea: Secondary | ICD-10-CM | POA: Insufficient documentation

## 2023-02-03 DIAGNOSIS — R1031 Right lower quadrant pain: Secondary | ICD-10-CM | POA: Diagnosis present

## 2023-02-03 DIAGNOSIS — E782 Mixed hyperlipidemia: Secondary | ICD-10-CM

## 2023-02-03 DIAGNOSIS — E559 Vitamin D deficiency, unspecified: Secondary | ICD-10-CM

## 2023-02-03 DIAGNOSIS — E538 Deficiency of other specified B group vitamins: Secondary | ICD-10-CM

## 2023-02-03 DIAGNOSIS — R739 Hyperglycemia, unspecified: Secondary | ICD-10-CM

## 2023-02-03 LAB — CBC WITH DIFFERENTIAL/PLATELET
Basophils Absolute: 0 10*3/uL (ref 0.0–0.1)
Basophils Relative: 0.7 % (ref 0.0–3.0)
Eosinophils Absolute: 0.1 10*3/uL (ref 0.0–0.7)
Eosinophils Relative: 2 % (ref 0.0–5.0)
HCT: 44.7 % (ref 39.0–52.0)
Hemoglobin: 15.5 g/dL (ref 13.0–17.0)
Lymphocytes Relative: 33 % (ref 12.0–46.0)
Lymphs Abs: 2.1 10*3/uL (ref 0.7–4.0)
MCHC: 34.6 g/dL (ref 30.0–36.0)
MCV: 89 fL (ref 78.0–100.0)
Monocytes Absolute: 0.5 10*3/uL (ref 0.1–1.0)
Monocytes Relative: 8.5 % (ref 3.0–12.0)
Neutro Abs: 3.5 10*3/uL (ref 1.4–7.7)
Neutrophils Relative %: 55.8 % (ref 43.0–77.0)
Platelets: 302 10*3/uL (ref 150.0–400.0)
RBC: 5.03 Mil/uL (ref 4.22–5.81)
RDW: 13.2 % (ref 11.5–15.5)
WBC: 6.3 10*3/uL (ref 4.0–10.5)

## 2023-02-03 MED ORDER — IOHEXOL 300 MG/ML  SOLN
100.0000 mL | Freq: Once | INTRAMUSCULAR | Status: AC | PRN
  Administered 2023-02-03: 100 mL via INTRAVENOUS

## 2023-02-04 ENCOUNTER — Telehealth: Payer: Self-pay | Admitting: Family Medicine

## 2023-02-04 ENCOUNTER — Other Ambulatory Visit (HOSPITAL_BASED_OUTPATIENT_CLINIC_OR_DEPARTMENT_OTHER): Payer: Self-pay

## 2023-02-04 ENCOUNTER — Other Ambulatory Visit: Payer: Self-pay | Admitting: Family Medicine

## 2023-02-04 MED ORDER — TIZANIDINE HCL 4 MG PO TABS
4.0000 mg | ORAL_TABLET | Freq: Three times a day (TID) | ORAL | 3 refills | Status: DC | PRN
  Filled 2023-02-04: qty 60, 20d supply, fill #0

## 2023-02-04 NOTE — Telephone Encounter (Signed)
Prescription Request  02/04/2023  Is this a "Controlled Substance" medicine? No  LOV: 02/02/23  What is the name of the medication or equipment? tiZANidine (ZANAFLEX) 4 MG tablet  Have you contacted your pharmacy to request a refill? Yes   Which pharmacy would you like this sent to?  [facility] [address], [address] [place] [address] Phone: [phone]  Fax: [phone]  Patient notified that their request is being sent to the clinical staff for review and that they should receive a response within 2 business days.   Please advise at Mobile [phone] (mobile)

## 2023-02-06 ENCOUNTER — Other Ambulatory Visit: Payer: Self-pay | Admitting: Family

## 2023-02-06 ENCOUNTER — Other Ambulatory Visit (HOSPITAL_BASED_OUTPATIENT_CLINIC_OR_DEPARTMENT_OTHER): Payer: Self-pay

## 2023-02-06 MED ORDER — TIZANIDINE HCL 4 MG PO TABS: 4.0000 mg | ORAL_TABLET | Freq: Three times a day (TID) | ORAL | 3 refills | Status: DC | PRN

## 2023-02-16 NOTE — Assessment & Plan Note (Signed)
Supplement and monitor 

## 2023-02-16 NOTE — Assessment & Plan Note (Signed)
hgba1c acceptable, minimize simple carbs. Increase exercise as tolerated.  

## 2023-02-16 NOTE — Assessment & Plan Note (Signed)
Encourage heart healthy diet such as MIND or DASH diet, increase exercise, avoid trans fats, simple carbohydrates and processed foods, consider a krill or fish or flaxseed oil cap daily.  °

## 2023-02-17 ENCOUNTER — Ambulatory Visit: Payer: No Typology Code available for payment source | Admitting: Family Medicine

## 2023-02-17 VITALS — BP 104/75 | HR 86 | Temp 97.8°F | Resp 18 | Ht 68.0 in | Wt 197.4 lb

## 2023-02-17 DIAGNOSIS — G43809 Other migraine, not intractable, without status migrainosus: Secondary | ICD-10-CM | POA: Diagnosis not present

## 2023-02-17 DIAGNOSIS — E559 Vitamin D deficiency, unspecified: Secondary | ICD-10-CM | POA: Diagnosis not present

## 2023-02-17 DIAGNOSIS — E538 Deficiency of other specified B group vitamins: Secondary | ICD-10-CM

## 2023-02-17 DIAGNOSIS — E782 Mixed hyperlipidemia: Secondary | ICD-10-CM | POA: Diagnosis not present

## 2023-02-17 DIAGNOSIS — R739 Hyperglycemia, unspecified: Secondary | ICD-10-CM | POA: Diagnosis not present

## 2023-02-17 MED ORDER — PROMETHAZINE HCL 25 MG PO TABS: 25.0000 mg | ORAL_TABLET | Freq: Three times a day (TID) | ORAL | 2 refills | Status: DC | PRN

## 2023-02-17 MED ORDER — KETOROLAC TROMETHAMINE 30 MG/ML IJ SOLN
30.0000 mg | Freq: Once | INTRAMUSCULAR | Status: AC
  Administered 2023-02-17: 30 mg via INTRAMUSCULAR

## 2023-02-17 MED ORDER — SUMATRIPTAN SUCCINATE 50 MG PO TABS: 50.0000 mg | ORAL_TABLET | ORAL | 0 refills | Status: DC | PRN

## 2023-02-17 NOTE — Patient Instructions (Addendum)
Vitamin D3 2000 international units daily    Consider an SSRI such as Fluoxetine or Sertraline  Or Buspar twice let us know if you are ready to proceed

## 2023-02-18 DIAGNOSIS — G43909 Migraine, unspecified, not intractable, without status migrainosus: Secondary | ICD-10-CM | POA: Insufficient documentation

## 2023-02-18 NOTE — Progress Notes (Signed)
Subjective:    Patient ID: Jared Myers, male    DOB: [birth year], 42 y.o.   MRN: [id number]  Chief Complaint  Patient presents with   Follow-up    3 month    HPI Discussed the use of AI scribe software for clinical note transcription with the patient, who gave verbal consent to proceed.  History of Present Illness   The patient, with a history of headaches, presents with a couple of episodes over the last month. The headaches are associated with nausea and light sensitivity, but no noise sensitivity. The patient denies any recent major illness, fevers, chills, or sinus issues. The patient also reports high stress levels due to personal and work commitments, leading to sleep deprivation. The patient wakes up at 5:30 am daily and goes to bed around 11 pm, resulting in inadequate sleep. The patient is considering starting medication to manage stress levels.        Past Medical History:  Diagnosis Date   Anxiety in acute stress reaction 01/06/2011   Constipation 12/18/2013   Hyperglycemia 09/28/2014   Hyperlipidemia, mixed 06/22/2014   Infectious mononucleosis 07/18/2013   Inguinal hernia 05/12/2012   Low back pain 12/16/2013   Overweight 06/22/2014   Pelvic floor dysfunction    Premature ventricular beat    Splenomegaly 07/18/2013   Tremulousness 08/14/2013    Past Surgical History:  Procedure Laterality Date   NO PAST SURGERIES      Family History  Problem Relation Age of Onset   Cancer Mother        Non Hodkins lymphoma, breast cancer   Other Father        pituatary adenoma   Cancer Paternal Uncle        recurrent prostate    Heart disease Maternal Grandmother        chf   Heart disease Maternal Grandfather        chf   Heart disease Paternal Grandfather        chf   Depression Brother    Anemia Sister     Social History   Socioeconomic History   Marital status: Married    Spouse name: Not on file   Number of children: Not on file   Years of education: Not on  file   Highest education level: Some college, no degree  Occupational History   Not on file  Tobacco Use   Smoking status: Never   Smokeless tobacco: Never  Substance and Sexual Activity   Alcohol use: No   Drug use: No   Sexual activity: Yes    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, works at post office, 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  Other Topics Concern   Not on file  Social History Narrative   New baby on the way 07/15/16   Wife is 13 weeks   Social Determinants of Health   Financial Resource Strain: Medium Risk (09/02/2022)   Overall Financial Resource Strain (CARDIA)    Difficulty of Paying Living Expenses: Somewhat hard  Food Insecurity: No Food Insecurity (09/02/2022)   Hunger Vital Sign    Worried About Running Out of Food in the Last Year: Never true    Ran Out of Food in the Last Year: Never true  Transportation Needs: No Transportation Needs (09/02/2022)   PRAPARE - Administrator, Civil Service (Medical): No    Lack of Transportation (Non-Medical): No  Physical Activity: Insufficiently Active (09/02/2022)   Exercise Vital Sign  Days of Exercise per Week: 2 days    Minutes of Exercise per Session: 30 min  Stress: Stress Concern Present (09/02/2022)   [employer] of Occupational Health - Occupational Stress Questionnaire    Feeling of Stress : To some extent  Social Connections: Socially Integrated (09/02/2022)   Social Connection and Isolation Panel [NHANES]    Frequency of Communication with Friends and Family: More than three times a week    Frequency of Social Gatherings with Friends and Family: Three times a week    Attends Religious Services: More than 4 times per year    Active Member of Clubs or Organizations: Yes    Attends Engineer, structural: More than 4 times per year    Marital Status: Married  Catering manager Violence: Not on file    Outpatient Medications Prior to Visit  Medication Sig Dispense Refill   ondansetron  (ZOFRAN-ODT) 8 MG disintegrating tablet Take 1 tablet (8 mg total) by mouth every 8 (eight) hours as needed for nausea or vomiting. 20 tablet 0   tiZANidine (ZANAFLEX) 4 MG tablet Take 1 tablet (4 mg total) by mouth every 8 (eight) hours as needed for muscle spasms. 60 tablet 3   No facility-administered medications prior to visit.    Allergies  Allergen Reactions   Guaifenesin     REACTION: PVCs   Prednisone     PVC    Review of Systems  Constitutional:  Positive for malaise/fatigue. Negative for fever.  HENT:  Negative for congestion.   Eyes:  Negative for blurred vision.  Respiratory:  Negative for shortness of breath.   Cardiovascular:  Negative for chest pain, palpitations and leg swelling.  Gastrointestinal:  Positive for abdominal pain. Negative for blood in stool and nausea.  Genitourinary:  Negative for dysuria and frequency.  Musculoskeletal:  Positive for falls and myalgias.  Skin:  Negative for rash.  Neurological:  Negative for dizziness, loss of consciousness and headaches.  Endo/Heme/Allergies:  Negative for environmental allergies.  Psychiatric/Behavioral:  Negative for depression. The patient is not nervous/anxious.        Objective:    Physical Exam Vitals reviewed.  Constitutional:      Appearance: Normal appearance. He is not ill-appearing.  HENT:     Head: Normocephalic and atraumatic.     Nose: Nose normal.  Eyes:     Conjunctiva/sclera: Conjunctivae normal.  Cardiovascular:     Rate and Rhythm: Normal rate.     Pulses: Normal pulses.     Heart sounds: Normal heart sounds. No murmur heard. Pulmonary:     Effort: Pulmonary effort is normal.     Breath sounds: Normal breath sounds. No wheezing.  Abdominal:     Palpations: Abdomen is soft. There is no mass.     Tenderness: There is no abdominal tenderness.  Musculoskeletal:     Cervical back: Normal range of motion.     Right lower leg: No edema.     Left lower leg: No edema.  Skin:     General: Skin is warm and dry.  Neurological:     General: No focal deficit present.     Mental Status: He is alert and oriented to person, place, and time.  Psychiatric:        Mood and Affect: Mood normal.     BP 104/75 (BP Location: Right Arm, Patient Position: Sitting, Cuff Size: Large)   Pulse 86   Temp 97.8 F (36.6 C) (Oral)   Resp 18  Ht 5\' 8"  (1.727 m)   Wt 197 lb 6.4 oz (89.5 kg)   SpO2 99%   BMI 30.01 kg/m  Wt Readings from Last 3 Encounters:  02/17/23 197 lb 6.4 oz (89.5 kg)  02/02/23 195 lb (88.5 kg)  12/02/22 195 lb 8 oz (88.7 kg)    Diabetic Foot Exam - Simple   No data filed    Lab Results  Component Value Date   WBC 6.3 02/02/2023   HGB 15.5 02/02/2023   HCT 44.7 02/02/2023   PLT 302.0 02/02/2023   GLUCOSE 96 02/02/2023   CHOL 187 09/02/2022   TRIG 133.0 09/02/2022   HDL 34.80 (L) 09/02/2022   LDLDIRECT 120.0 07/23/2020   LDLCALC 125 (H) 09/02/2022   ALT 19 02/02/2023   AST 15 02/02/2023   NA 140 02/02/2023   K 3.8 02/02/2023   CL 102 02/02/2023   CREATININE 1.13 02/02/2023   BUN 12 02/02/2023   CO2 29 02/02/2023   TSH 1.14 09/02/2022   PSA 0.52 01/24/2014   HGBA1C 5.2 09/02/2022    Lab Results  Component Value Date   TSH 1.14 09/02/2022   Lab Results  Component Value Date   WBC 6.3 02/02/2023   HGB 15.5 02/02/2023   HCT 44.7 02/02/2023   MCV 89.0 02/02/2023   PLT 302.0 02/02/2023   Lab Results  Component Value Date   NA 140 02/02/2023   K 3.8 02/02/2023   CO2 29 02/02/2023   GLUCOSE 96 02/02/2023   BUN 12 02/02/2023   CREATININE 1.13 02/02/2023   BILITOT 0.7 02/02/2023   ALKPHOS 67 02/02/2023   AST 15 02/02/2023   ALT 19 02/02/2023   PROT 7.4 02/02/2023   ALBUMIN 4.7 02/02/2023   CALCIUM 9.9 02/02/2023   ANIONGAP 7 06/06/2014   GFR 79.99 02/02/2023   Lab Results  Component Value Date   CHOL 187 09/02/2022   Lab Results  Component Value Date   HDL 34.80 (L) 09/02/2022   Lab Results  Component Value Date    LDLCALC 125 (H) 09/02/2022   Lab Results  Component Value Date   TRIG 133.0 09/02/2022   Lab Results  Component Value Date   CHOLHDL 5 09/02/2022   Lab Results  Component Value Date   HGBA1C 5.2 09/02/2022       Assessment & Plan:  Hyperglycemia Assessment & Plan: hgba1c acceptable, minimize simple carbs. Increase exercise as tolerated.    Hyperlipidemia, mixed Assessment & Plan: Encourage heart healthy diet such as MIND or DASH diet, increase exercise, avoid trans fats, simple carbohydrates and processed foods, consider a krill or fish or flaxseed oil cap daily.    Vitamin B12 deficiency Assessment & Plan: Supplement and monitor    Vitamin D deficiency Assessment & Plan: Supplement and monitor    Other migraine without status migrainosus, not intractable -     Ketorolac Tromethamine  Other orders -     Promethazine HCl; Take 1 tablet (25 mg total) by mouth every 8 (eight) hours as needed for nausea or vomiting.  Dispense: 20 tablet; Refill: 2 -     SUMAtriptan Succinate; Take 1 tablet (50 mg total) by mouth every 2 (two) hours as needed for migraine (max of 100 mg a day). May repeat in 2 hours if headache persists or recurs.  Dispense: 10 tablet; Refill: 0    Assessment and Plan    Migraine Recent headaches with associated nausea and light sensitivity. No noise sensitivity. -Administer Toradol 30mg  IM today. -Start  Zofran for nausea. -Start Imitrex to break the next headache. -Consider Phenergan to aid sleep and break the headache cycle.  Stress and Sleep Deprivation Reports high stress levels and inadequate sleep (approximately 6.5 hours per night). No current use of medications for stress management. -Discussed importance of adequate sleep for overall health and migraine management. -Consider starting an SSRI such as fluoxetine or sertraline, or Buspar for stress management. Patient to notify provider if ready to proceed. -Encouraged healthy eating habits  and exercise.  Follow-up in 3-4 months to assess response to treatment and overall health status.         Danise Edge, MD

## 2023-03-05 ENCOUNTER — Telehealth: Payer: Self-pay

## 2023-03-05 ENCOUNTER — Other Ambulatory Visit (HOSPITAL_BASED_OUTPATIENT_CLINIC_OR_DEPARTMENT_OTHER): Payer: Self-pay

## 2023-03-05 ENCOUNTER — Telehealth: Payer: Self-pay | Admitting: Family Medicine

## 2023-03-05 ENCOUNTER — Ambulatory Visit: Payer: No Typology Code available for payment source | Admitting: Medical

## 2023-03-05 ENCOUNTER — Encounter: Payer: Self-pay | Admitting: Medical

## 2023-03-05 VITALS — BP 116/64 | HR 66 | Temp 97.7°F | Resp 18 | Ht 68.0 in | Wt 193.0 lb

## 2023-03-05 DIAGNOSIS — R197 Diarrhea, unspecified: Secondary | ICD-10-CM

## 2023-03-05 DIAGNOSIS — R5383 Other fatigue: Secondary | ICD-10-CM

## 2023-03-05 DIAGNOSIS — R7989 Other specified abnormal findings of blood chemistry: Secondary | ICD-10-CM | POA: Diagnosis not present

## 2023-03-05 DIAGNOSIS — K529 Noninfective gastroenteritis and colitis, unspecified: Secondary | ICD-10-CM

## 2023-03-05 DIAGNOSIS — J029 Acute pharyngitis, unspecified: Secondary | ICD-10-CM | POA: Diagnosis not present

## 2023-03-05 LAB — CBC WITH DIFFERENTIAL/PLATELET
Basophils Absolute: 0 10*3/uL (ref 0.0–0.1)
Basophils Relative: 0.5 % (ref 0.0–3.0)
Eosinophils Absolute: 0.1 10*3/uL (ref 0.0–0.7)
Eosinophils Relative: 2.6 % (ref 0.0–5.0)
HCT: 44.3 % (ref 39.0–52.0)
Hemoglobin: 15.4 g/dL (ref 13.0–17.0)
Lymphocytes Relative: 30.7 % (ref 12.0–46.0)
Lymphs Abs: 1.5 10*3/uL (ref 0.7–4.0)
MCHC: 34.8 g/dL (ref 30.0–36.0)
MCV: 88.1 fL (ref 78.0–100.0)
Monocytes Absolute: 1 10*3/uL (ref 0.1–1.0)
Monocytes Relative: 20.3 % — ABNORMAL HIGH (ref 3.0–12.0)
Neutro Abs: 2.3 10*3/uL (ref 1.4–7.7)
Neutrophils Relative %: 45.9 % (ref 43.0–77.0)
Platelets: 250 10*3/uL (ref 150.0–400.0)
RBC: 5.03 Mil/uL (ref 4.22–5.81)
RDW: 13.1 % (ref 11.5–15.5)
WBC: 4.9 10*3/uL (ref 4.0–10.5)

## 2023-03-05 LAB — COMPREHENSIVE METABOLIC PANEL
ALT: 18 U/L (ref 0–53)
AST: 16 U/L (ref 0–37)
Albumin: 4.5 g/dL (ref 3.5–5.2)
Alkaline Phosphatase: 61 U/L (ref 39–117)
BUN: 17 mg/dL (ref 6–23)
CO2: 32 meq/L (ref 19–32)
Calcium: 9.3 mg/dL (ref 8.4–10.5)
Chloride: 97 meq/L (ref 96–112)
Creatinine, Ser: 1.13 mg/dL (ref 0.40–1.50)
GFR: 79.95 mL/min (ref >60.00)
Glucose, Bld: 92 mg/dL (ref 70–99)
Potassium: 4.2 meq/L (ref 3.5–5.1)
Sodium: 137 meq/L (ref 135–145)
Total Bilirubin: 1 mg/dL (ref 0.2–1.2)
Total Protein: 7.3 g/dL (ref 6.0–8.3)

## 2023-03-05 MED ORDER — ONDANSETRON HCL 4 MG PO TABS
4.0000 mg | ORAL_TABLET | Freq: Three times a day (TID) | ORAL | 0 refills | Status: DC | PRN
  Filled 2023-03-05: qty 20, 7d supply, fill #0

## 2023-03-05 MED ORDER — ONDANSETRON 4 MG PO TBDP
4.0000 mg | ORAL_TABLET | Freq: Three times a day (TID) | ORAL | 0 refills | Status: AC | PRN
  Filled 2023-03-05: qty 20, 7d supply, fill #0

## 2023-03-05 NOTE — Telephone Encounter (Signed)
Dr note updated sent via [facility]

## 2023-03-05 NOTE — Patient Instructions (Signed)
Acute Gastroenteritis Onset on 03/03/2023 with severe vomiting and diarrhea, possibly related to consumption of chicken from [facility]. Symptoms have improved but still experiencing some nausea, headache, and lightheadedness. Likely viral gastroenteritis or food poisoning. -Start Zofran for nausea or vomiting -If diarrhea persists, start Imodium. -Eat bland foods and hydrate with sugar-free Gatorade, [employer] water, or Pedialyte. -Obtain stool kit and submit sample if diarrhea persists. -Check blood work to assess for dehydration and wbc count.  Follow-up To be determined based on symptom resolution. If symptoms persist into next week, schedule follow-up appointment.

## 2023-03-05 NOTE — Telephone Encounter (Signed)
Work note updated and pt notified via Clinical [facility]ist

## 2023-03-05 NOTE — Progress Notes (Signed)
Subjective:    Patient ID: Jared Myers, male    DOB: 12/25/1980, 42 y.o.   MRN: [id number]  HPI  Discussed the use of AI scribe software for clinical note transcription with the patient, who gave verbal consent to proceed.  History of Present Illness   Pt presented with acute onset of gastrointestinal symptoms starting on Christmas Eve. He reported feeling well at work earlier in the day, but developed sudden diarrhea and vomiting after a meal at his parents' house. The patient suspected food poisoning from a meal of chicken from [facility] consumed at lunch the same day, which he described as having an unusual, rubbery texture.  The patient experienced severe vomiting and diarrhea, with seven episodes of vomiting and multiple episodes of diarrhea, which continued into the early hours of Christmas morning. The severity of the symptoms was such that he considered calling EMS. The patient reported significant weight loss, approximately four pounds, since the onset of symptoms.  In the days following, the patient continued to experience residual nausea and a headache. He reported feeling lightheaded and dizzy, with a persistent sensation of thirst despite drinking fluids. He also reported ongoing loose stools, but noted a decrease in frequency due to reduced food intake. The patient had taken a Z-Pak the previous week for a sore throat, but the diarrhea started after this course of antibiotics.  The patient's symptoms have resulted in significant fatigue and body aches, and he reported feeling dehydrated. He has been unable to tolerate solid foods and has been subsisting on small amounts of apple juice. The patient's symptoms have significantly impacted his daily activities and work schedule.       Review of Systems  Constitutional:  Positive for fatigue. Negative for chills and fever.  HENT:  Negative for congestion.   Respiratory:  Negative for chest tightness, shortness of breath and wheezing.    Cardiovascular:  Negative for chest pain and palpitations.  Gastrointestinal:  Positive for diarrhea, nausea and vomiting. Negative for abdominal pain and blood in stool.  Genitourinary:  Negative for dysuria, frequency and urgency.  Musculoskeletal:  Negative for gait problem.  Skin:  Negative for pallor and rash.  Neurological:  Negative for dizziness, weakness and headaches.       Still some light headed.  Hematological:  Negative for adenopathy. Does not bruise/bleed easily.  Psychiatric/Behavioral:  Negative for behavioral problems and decreased concentration.     Past Medical History:  Diagnosis Date   Anxiety in acute stress reaction 01/06/2011   Constipation 12/18/2013   Hyperglycemia 09/28/2014   Hyperlipidemia, mixed 06/22/2014   Infectious mononucleosis 07/18/2013   Inguinal hernia 05/12/2012   Low back pain 12/16/2013   Overweight 06/22/2014   Pelvic floor dysfunction    Premature ventricular beat    Splenomegaly 07/18/2013   Tremulousness 08/14/2013     Social History   Socioeconomic History   Marital status: Married    Spouse name: Not on file   Number of children: Not on file   Years of education: Not on file   Highest education level: Some college, no degree  Occupational History   Not on file  Tobacco Use   Smoking status: Never   Smokeless tobacco: Never  Substance and Sexual Activity   Alcohol use: No   Drug use: No   Sexual activity: Yes    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, works at post office, 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  Other Topics Concern   Not on file  Social History Narrative   New baby on the way 07/15/16   Wife is 13 weeks   Social Drivers of Corporate investment banker Strain: Low Risk  (03/05/2023)   Overall Financial Resource Strain (CARDIA)    Difficulty of Paying Living Expenses: Not hard at all  Food Insecurity: No Food Insecurity (03/05/2023)   Hunger Vital Sign    Worried About Running Out of Food in the Last Year: Never true     Ran Out of Food in the Last Year: Never true  Transportation Needs: No Transportation Needs (03/05/2023)   PRAPARE - Administrator, Civil Service (Medical): No    Lack of Transportation (Non-Medical): No  Physical Activity: Insufficiently Active (03/05/2023)   Exercise Vital Sign    Days of Exercise per Week: 2 days    Minutes of Exercise per Session: 20 min  Stress: No Stress Concern Present (03/05/2023)   [employer] of Occupational Health - Occupational Stress Questionnaire    Feeling of Stress : Not at all  Social Connections: Socially Integrated (03/05/2023)   Social Connection and Isolation Panel [NHANES]    Frequency of Communication with Friends and Family: More than three times a week    Frequency of Social Gatherings with Friends and Family: More than three times a week    Attends Religious Services: More than 4 times per year    Active Member of [employer] or Organizations: Yes    Attends Engineer, structural: More than 4 times per year    Marital Status: Married  Catering manager Violence: Not on file    Past Surgical History:  Procedure Laterality Date   NO PAST SURGERIES      Family History  Problem Relation Age of Onset   Cancer Mother        Non Hodkins lymphoma, breast cancer   Other Father        pituatary adenoma   Cancer Paternal Uncle        recurrent prostate    Heart disease Maternal Grandmother        chf   Heart disease Maternal Grandfather        chf   Heart disease Paternal Grandfather        chf   Depression Brother    Anemia Sister     Allergies  Allergen Reactions   Guaifenesin     REACTION: PVCs   Prednisone     PVC    Current Outpatient Medications on File Prior to Visit  Medication Sig Dispense Refill   promethazine (PHENERGAN) 25 MG tablet Take 1 tablet (25 mg total) by mouth every 8 (eight) hours as needed for nausea or vomiting. 20 tablet 2   SUMAtriptan (IMITREX) 50 MG tablet Take 1 tablet (50 mg  total) by mouth every 2 (two) hours as needed for migraine (max of 100 mg a day). May repeat in 2 hours if headache persists or recurs. 10 tablet 0   tiZANidine (ZANAFLEX) 4 MG tablet Take 1 tablet (4 mg total) by mouth every 8 (eight) hours as needed for muscle spasms. 60 tablet 3   No current facility-administered medications on file prior to visit.    BP 116/64   Pulse 66   Temp 97.7 F (36.5 C)   Resp 18   Ht 5\' 8"  (1.727 m)   Wt 193 lb (87.5 kg)   SpO2 97%   BMI 29.35 kg/m        Objective:  Physical Exam  General Mental Status- Alert. General Appearance- Not in acute distress.   Skin General: Color- Normal Color. Moisture- Normal Moisture.  Neck Carotid Arteries- Normal color. Moisture- Normal Moisture. No carotid bruits. No JVD.  Chest and Lung Exam Auscultation: Breath Sounds:-CTA  Cardiovascular Auscultation:Rythm- RRR Murmurs & Other Heart Sounds:Auscultation of the heart reveals- No Murmurs.  Abdomen Inspection:-Inspeection Normal. Palpation/Percussion:Note:No mass. Palpation and Percussion of the abdomen reveal- Non Tender, Non Distended + BS, no rebound or guarding.   Neurologic Cranial Nerve exam:- CN III-XII intact(No nystagmus), symmetric smile. Strength:- 5/5 equal and symmetric strength both upper and lower extremities.       Assessment & Plan:   Assessment and Plan    Acute Gastroenteritis Onset on 03/03/2023 with severe vomiting and diarrhea, possibly related to consumption of chicken from [facility]. Symptoms have improved but still experiencing some nausea, headache, and lightheadedness. Likely viral gastroenteritis or food poisoning. -Start Zofran for nausea. -If diarrhea persists, start Imodium. -Eat bland foods and hydrate with sugar-free Gatorade, [employer] water, or Pedialyte. -Obtain stool kit and submit sample if diarrhea persists. -Check blood work to assess for dehydration and wbc count.  Follow-up To be determined based on  symptom resolution. If symptoms persist into next week, schedule follow-up appointment.        Esperanza Richters, PA-C

## 2023-03-05 NOTE — Telephone Encounter (Signed)
Copied from CRM [id number]. Topic: General - Inquiry >> Mar 05, 2023 12:47 PM Mosetta Putt H wrote: Reason for CRM: pt needs a new doctor that states thru the 30th and  must still say incapacitated and will be bringing fmla paperwork

## 2023-03-05 NOTE — Telephone Encounter (Signed)
Copied from CRM [id number]. Topic: General - Other >> Mar 05, 2023  2:02 PM Dennison Nancy wrote: Reason for CRM: Patient  has a return to work note that need to be corrected soon as possible so he can return to work   suppose to be return date of 03/09/23 instead it has incorrect date of return to work on 04/09/2023  Was advise to send to my chart and have Saguier,Edward to rewrite with correct return to work date of 03/09/23

## 2023-03-06 ENCOUNTER — Ambulatory Visit: Payer: No Typology Code available for payment source

## 2023-03-06 DIAGNOSIS — R7989 Other specified abnormal findings of blood chemistry: Secondary | ICD-10-CM

## 2023-03-06 DIAGNOSIS — J029 Acute pharyngitis, unspecified: Secondary | ICD-10-CM

## 2023-03-06 DIAGNOSIS — R5383 Other fatigue: Secondary | ICD-10-CM

## 2023-03-06 NOTE — Addendum Note (Signed)
Addended by: Gwenevere Abbot on: 03/06/2023 05:59 AM   Modules accepted: Orders

## 2023-03-07 ENCOUNTER — Encounter: Payer: Self-pay | Admitting: Medical

## 2023-03-07 LAB — EPSTEIN-BARR VIRUS VCA ANTIBODY PANEL
EBV NA IgG: >600 U/mL — ABNORMAL HIGH
EBV VCA IgG: 322 U/mL — ABNORMAL HIGH
EBV VCA IgM: <36 U/mL

## 2023-03-24 ENCOUNTER — Other Ambulatory Visit: Payer: Self-pay | Admitting: Family Medicine

## 2023-04-13 ENCOUNTER — Ambulatory Visit: Payer: Commercial Managed Care - PPO | Admitting: Medical

## 2023-04-13 ENCOUNTER — Other Ambulatory Visit (HOSPITAL_BASED_OUTPATIENT_CLINIC_OR_DEPARTMENT_OTHER): Payer: Self-pay

## 2023-04-13 ENCOUNTER — Encounter: Payer: Self-pay | Admitting: Medical

## 2023-04-13 VITALS — BP 112/72 | HR 76 | Temp 98.2°F | Resp 18 | Ht 68.0 in | Wt 195.0 lb

## 2023-04-13 DIAGNOSIS — J029 Acute pharyngitis, unspecified: Secondary | ICD-10-CM | POA: Diagnosis not present

## 2023-04-13 DIAGNOSIS — E559 Vitamin D deficiency, unspecified: Secondary | ICD-10-CM | POA: Diagnosis not present

## 2023-04-13 DIAGNOSIS — J3489 Other specified disorders of nose and nasal sinuses: Secondary | ICD-10-CM | POA: Diagnosis not present

## 2023-04-13 DIAGNOSIS — E538 Deficiency of other specified B group vitamins: Secondary | ICD-10-CM | POA: Diagnosis not present

## 2023-04-13 DIAGNOSIS — H9202 Otalgia, left ear: Secondary | ICD-10-CM

## 2023-04-13 LAB — VITAMIN B12: Vitamin B-12: 170 pg/mL — ABNORMAL LOW (ref 211–911)

## 2023-04-13 LAB — VITAMIN D 25 HYDROXY (VIT D DEFICIENCY, FRACTURES): VITD: 18.29 ng/mL — ABNORMAL LOW (ref 30.00–100.00)

## 2023-04-13 MED ORDER — BENZONATATE 100 MG PO CAPS
100.0000 mg | ORAL_CAPSULE | Freq: Three times a day (TID) | ORAL | 0 refills | Status: DC | PRN
  Filled 2023-04-13: qty 30, 10d supply, fill #0

## 2023-04-13 MED ORDER — VITAMIN D (ERGOCALCIFEROL) 1.25 MG (50000 UNIT) PO CAPS
50000.0000 [IU] | ORAL_CAPSULE | ORAL | 0 refills | Status: DC
  Filled 2023-04-13: qty 8, 56d supply, fill #0

## 2023-04-13 MED ORDER — AZITHROMYCIN 250 MG PO TABS
ORAL_TABLET | ORAL | 0 refills | Status: AC
  Filled 2023-04-13: qty 6, 5d supply, fill #0

## 2023-04-13 NOTE — Addendum Note (Signed)
Addended by: Gwenevere Abbot on: 04/13/2023 09:44 PM   Modules accepted: Orders

## 2023-04-13 NOTE — Progress Notes (Signed)
Subjective:    Patient ID: Jared Myers, male    DOB: January 21, 1981, 43 y.o.   MRN: [id number]  HPI  Discussed the use of AI scribe software for clinical note transcription with the patient, who gave verbal consent to proceed.  History of Present Illness   The patient presents with sore throat, sinus pressure, and left ear pain.  Symptoms began on Wednesday with a burning sensation in the throat starting on Friday. Sore throat,  nasalcongestion, and a sensation of phlegm in the throat, particularly at night, have disrupted sleep. Pain is experienced when swallowing. No body aches, chills, or fever, but there is fatigue due to difficulty sleeping.  Left ear pain and sinus pressure are present. No coughing up mucus, but there is a sensation of phlegm in the throat, especially when lying down.  Theraflu was initially used, providing temporary relief for about four to five hours. Generic Flonase nasal spray has provided some relief but has not completely resolved symptoms. A  throat culture test is pending due to the unavailability of a rapid strep test. Previous use of azithromycin has been effective in the past. Pt prefers this rather than augmentin or cefdnir.            Past Medical History:  Diagnosis Date   Anxiety in acute stress reaction 01/06/2011   Constipation 12/18/2013   Hyperglycemia 09/28/2014   Hyperlipidemia, mixed 06/22/2014   Infectious mononucleosis 07/18/2013   Inguinal hernia 05/12/2012   Low back pain 12/16/2013   Overweight 06/22/2014   Pelvic floor dysfunction    Premature ventricular beat    Splenomegaly 07/18/2013   Tremulousness 08/14/2013     Social History   Socioeconomic History   Marital status: Married    Spouse name: Not on file   Number of children: Not on file   Years of education: Not on file   Highest education level: Some college, no degree  Occupational History   Not on file  Tobacco Use   Smoking status: Never   Smokeless tobacco: Never   Substance and Sexual Activity   Alcohol use: No   Drug use: No   Sexual activity: Yes    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, works at post [employer], 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  Other Topics Concern   Not on file  Social History Narrative   New baby on the way 07/15/16   Wife is 13 weeks   Social Drivers of Corporate investment banker Strain: Low Risk  (03/05/2023)   Overall Financial Resource Strain (CARDIA)    Difficulty of Paying Living Expenses: Not hard at all  Food Insecurity: No Food Insecurity (03/05/2023)   Hunger Vital Sign    Worried About Running Out of Food in the Last Year: Never true    Ran Out of Food in the Last Year: Never true  Transportation Needs: No Transportation Needs (03/05/2023)   PRAPARE - Administrator, Civil Service (Medical): No    Lack of Transportation (Non-Medical): No  Physical Activity: Insufficiently Active (03/05/2023)   Exercise Vital Sign    Days of Exercise per Week: 2 days    Minutes of Exercise per Session: 20 min  Stress: No Stress Concern Present (03/05/2023)   [employer] of Occupational Health - Occupational Stress Questionnaire    Feeling of Stress : Not at all  Social Connections: Socially Integrated (03/05/2023)   Social Connection and Isolation Panel [NHANES]    Frequency of Communication with Friends  and Family: More than three times a week    Frequency of Social Gatherings with Friends and Family: More than three times a week    Attends Religious Services: More than 4 times per year    Active Member of [employer] or Organizations: Yes    Attends Engineer, structural: More than 4 times per year    Marital Status: Married  Catering manager Violence: Not on file    Past Surgical History:  Procedure Laterality Date   NO PAST SURGERIES      Family History  Problem Relation Age of Onset   Cancer Mother        Non Hodkins lymphoma, breast cancer   Other Father        pituatary adenoma   Cancer  Paternal Uncle        recurrent prostate    Heart disease Maternal Grandmother        chf   Heart disease Maternal Grandfather        chf   Heart disease Paternal Grandfather        chf   Depression Brother    Anemia Sister     Allergies  Allergen Reactions   Guaifenesin     REACTION: PVCs   Prednisone     PVC    Current Outpatient Medications on File Prior to Visit  Medication Sig Dispense Refill   ondansetron (ZOFRAN-ODT) 4 MG disintegrating tablet Take 1 tablet (4 mg total) by mouth every 8 (eight) hours as needed for nausea or vomiting. 20 tablet 0   promethazine (PHENERGAN) 25 MG tablet Take 1 tablet (25 mg total) by mouth every 8 (eight) hours as needed for nausea or vomiting. 20 tablet 2   SUMAtriptan (IMITREX) 50 MG tablet Take 1 tablet (50 mg total) by mouth every 2 (two) hours as needed for migraine. May repeat 1 time in 24 hours 10 tablet 5   tiZANidine (ZANAFLEX) 4 MG tablet Take 1 tablet (4 mg total) by mouth every 8 (eight) hours as needed for muscle spasms. 60 tablet 3   No current facility-administered medications on file prior to visit.    BP 112/72   Pulse 76   Temp 98.2 F (36.8 C)   Resp 18   Ht 5\' 8"  (1.727 m)   Wt 195 lb (88.5 kg)   SpO2 96%   BMI 29.65 kg/m         Review of Systems  Constitutional:  Positive for fatigue. Negative for chills and fever.       Mild cough occasionally disturbs sleep.  HENT:  Positive for congestion, ear pain and sore throat.   Respiratory:  Negative for cough, choking and wheezing.   Cardiovascular:  Negative for chest pain and palpitations.  Gastrointestinal:  Negative for abdominal pain, constipation, nausea and vomiting.  Musculoskeletal:  Negative for back pain and myalgias.  Skin:  Negative for rash.  Neurological:  Negative for dizziness, syncope, weakness and light-headedness.  Hematological:  Negative for adenopathy. Does not bruise/bleed easily.  Psychiatric/Behavioral:  Negative for  hallucinations.     Past Medical History:  Diagnosis Date   Anxiety in acute stress reaction 01/06/2011   Constipation 12/18/2013   Hyperglycemia 09/28/2014   Hyperlipidemia, mixed 06/22/2014   Infectious mononucleosis 07/18/2013   Inguinal hernia 05/12/2012   Low back pain 12/16/2013   Overweight 06/22/2014   Pelvic floor dysfunction    Premature ventricular beat    Splenomegaly 07/18/2013   Tremulousness 08/14/2013  Social History   Socioeconomic History   Marital status: Married    Spouse name: Not on file   Number of children: Not on file   Years of education: Not on file   Highest education level: Some college, no degree  Occupational History   Not on file  Tobacco Use   Smoking status: Never   Smokeless tobacco: Never  Substance and Sexual Activity   Alcohol use: No   Drug use: No   Sexual activity: Yes    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, works at post office, 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  Other Topics Concern   Not on file  Social History Narrative   New baby on the way 07/15/16   Wife is 13 weeks   Social Drivers of Corporate investment banker Strain: Low Risk  (03/05/2023)   Overall Financial Resource Strain (CARDIA)    Difficulty of Paying Living Expenses: Not hard at all  Food Insecurity: No Food Insecurity (03/05/2023)   Hunger Vital Sign    Worried About Running Out of Food in the Last Year: Never true    Ran Out of Food in the Last Year: Never true  Transportation Needs: No Transportation Needs (03/05/2023)   PRAPARE - Administrator, Civil Service (Medical): No    Lack of Transportation (Non-Medical): No  Physical Activity: Insufficiently Active (03/05/2023)   Exercise Vital Sign    Days of Exercise per Week: 2 days    Minutes of Exercise per Session: 20 min  Stress: No Stress Concern Present (03/05/2023)   [employer] of Occupational Health - Occupational Stress Questionnaire    Feeling of Stress : Not at all  Social  Connections: Socially Integrated (03/05/2023)   Social Connection and Isolation Panel [NHANES]    Frequency of Communication with Friends and Family: More than three times a week    Frequency of Social Gatherings with Friends and Family: More than three times a week    Attends Religious Services: More than 4 times per year    Active Member of [employer] or Organizations: Yes    Attends Engineer, structural: More than 4 times per year    Marital Status: Married  Catering manager Violence: Not on file    Past Surgical History:  Procedure Laterality Date   NO PAST SURGERIES      Family History  Problem Relation Age of Onset   Cancer Mother        Non Hodkins lymphoma, breast cancer   Other Father        pituatary adenoma   Cancer Paternal Uncle        recurrent prostate    Heart disease Maternal Grandmother        chf   Heart disease Maternal Grandfather        chf   Heart disease Paternal Grandfather        chf   Depression Brother    Anemia Sister     Allergies  Allergen Reactions   Guaifenesin     REACTION: PVCs   Prednisone     PVC    Current Outpatient Medications on File Prior to Visit  Medication Sig Dispense Refill   ondansetron (ZOFRAN-ODT) 4 MG disintegrating tablet Take 1 tablet (4 mg total) by mouth every 8 (eight) hours as needed for nausea or vomiting. 20 tablet 0   promethazine (PHENERGAN) 25 MG tablet Take 1 tablet (25 mg total) by mouth every 8 (eight) hours as  needed for nausea or vomiting. 20 tablet 2   SUMAtriptan (IMITREX) 50 MG tablet Take 1 tablet (50 mg total) by mouth every 2 (two) hours as needed for migraine. May repeat 1 time in 24 hours 10 tablet 5   tiZANidine (ZANAFLEX) 4 MG tablet Take 1 tablet (4 mg total) by mouth every 8 (eight) hours as needed for muscle spasms. 60 tablet 3   No current facility-administered medications on file prior to visit.    BP 112/72   Pulse 76   Temp 98.2 F (36.8 C)   Resp 18   Ht 5\' 8"  (1.727 m)    Wt 195 lb (88.5 kg)   SpO2 96%   BMI 29.65 kg/m        Objective:   Physical Exam  General Mental Status- Alert. General Appearance- Not in acute distress.   Skin General: Color- Normal Color. Moisture- Normal Moisture.  Neck Carotid Arteries- Normal color. Moisture- Normal Moisture. No carotid bruits. No JVD.  Chest and Lung Exam Auscultation: Breath Sounds:CTA  Cardiovascular Auscultation:Rythm- RRR Murmurs & Other Heart Sounds:Auscultation of the heart reveals- No Murmurs.  Abdomen Inspection:-Inspeection Normal. Palpation/Percussion:Note:No mass. Palpation and Percussion of the abdomen reveal- Non Tender, Non Distended + BS, no rebound or guarding.   Neurologic Cranial Nerve exam:- CN III-XII intact(No nystagmus), symmetric smile. Strength:- 5/5 equal and symmetric strength both upper and lower extremities.       Assessment & Plan:   Patient Instructions  Glenford Peers onset of symptom but concern for strep, sinus infection and early ear infection by exam. Symptoms of sore throat, sinus pressure, and left ear pain. Throat culture sent out for testing. Left ear appears slightly reddish on exam. -Prescribe Azithromycin.(antbiotic choice discussed) -Continue Flonase nasal spray. -Provide Benzonatate for cough as needed. -Update patient when culture results are available.  Vitamin B12 and Vitamin D Deficiency History of low levels. Last B12 level was 250 seven months ago but lower in past.  Vitamin D has also been low in the past. -Order B12 and Vitamin D levels. -Based on results, will determine if B12 injections are needed or if high dose oral supplementation is sufficient. -Will advise on Vitamin D dosage based on results.  Follow-up If symptoms do not significantly improve in 7 days or if condition worsens, patient should return for evaluation. Will update patient on lab results as they become available.    Esperanza Richters, PA-C

## 2023-04-13 NOTE — Addendum Note (Signed)
Addended by: Gwenevere Abbot on: 04/13/2023 12:35 PM   Modules accepted: Orders

## 2023-04-13 NOTE — Patient Instructions (Signed)
Uri onset of symptom but concern for strep, sinus infection and early ear infection by exam. Symptoms of sore throat, sinus pressure, and left ear pain. Throat culture sent out for testing. Left ear appears slightly reddish on exam. -Prescribe Azithromycin.(antbiotic choice discussed) -Continue Flonase nasal spray. -Provide Benzonatate for cough as needed. -Update patient when culture results are available.  Vitamin B12 and Vitamin D Deficiency History of low levels. Last B12 level was 250 seven months ago but lower in past.  Vitamin D has also been low in the past. -Order B12 and Vitamin D levels. -Based on results, will determine if B12 injections are needed or if high dose oral supplementation is sufficient. -Will advise on Vitamin D dosage based on results.  Follow-up If symptoms do not significantly improve in 7 days or if condition worsens, patient should return for evaluation. Will update patient on lab results as they become available.

## 2023-04-14 ENCOUNTER — Other Ambulatory Visit (HOSPITAL_BASED_OUTPATIENT_CLINIC_OR_DEPARTMENT_OTHER): Payer: Self-pay

## 2023-04-15 LAB — CULTURE, GROUP A STREP
Micro Number: 16032432
SPECIMEN QUALITY:: ADEQUATE

## 2023-04-20 ENCOUNTER — Telehealth: Payer: Self-pay | Admitting: Family Medicine

## 2023-04-20 ENCOUNTER — Ambulatory Visit (INDEPENDENT_AMBULATORY_CARE_PROVIDER_SITE_OTHER): Payer: Commercial Managed Care - PPO

## 2023-04-20 DIAGNOSIS — E538 Deficiency of other specified B group vitamins: Secondary | ICD-10-CM

## 2023-04-20 MED ORDER — CYANOCOBALAMIN 1000 MCG/ML IJ SOLN
1000.0000 ug | INTRAMUSCULAR | Status: AC
  Administered 2023-04-20 – 2023-06-15 (×3): 1000 ug via INTRAMUSCULAR

## 2023-04-20 NOTE — Telephone Encounter (Signed)
 Called patient to schedule appointment for B12 injection, but he said he already has it scheduled at [facility]

## 2023-04-20 NOTE — Telephone Encounter (Signed)
 Copied from CRM [id number]. Topic: Appointments - Appointment Scheduling >> Apr 17, 2023  5:57 PM Ovid Blow wrote: Patient/patient representative is calling to schedule an appointment to get (B-12 injection)

## 2023-04-20 NOTE — Progress Notes (Signed)
 Pt here for monthly B12 injection per [facility]  B12 1000mcg given IM, and pt tolerated injection well.  Next B12 injection scheduled for 1 month

## 2023-05-18 ENCOUNTER — Ambulatory Visit (INDEPENDENT_AMBULATORY_CARE_PROVIDER_SITE_OTHER): Payer: Commercial Managed Care - PPO

## 2023-05-18 DIAGNOSIS — E538 Deficiency of other specified B group vitamins: Secondary | ICD-10-CM

## 2023-05-18 NOTE — Progress Notes (Addendum)
 Pt here for monthly B12 injection per PCP  B12 given IM, and pt tolerated injection well.  Next B12 injection scheduled for N/A   Please sign in absence of PCP

## 2023-05-21 ENCOUNTER — Ambulatory Visit: Payer: No Typology Code available for payment source | Admitting: Family Medicine

## 2023-06-15 ENCOUNTER — Ambulatory Visit

## 2023-06-15 DIAGNOSIS — E538 Deficiency of other specified B group vitamins: Secondary | ICD-10-CM

## 2023-06-15 NOTE — Progress Notes (Signed)
 Pt here for monthly B12 injection per Esperanza Richters, PA-C  B12 given IM left deltoid, and pt tolerated injection well.  Next B12 injection scheduled for 1 month.  Please sign in absence of PCP.

## 2023-07-20 ENCOUNTER — Ambulatory Visit

## 2023-07-20 DIAGNOSIS — E538 Deficiency of other specified B group vitamins: Secondary | ICD-10-CM

## 2023-07-20 MED ORDER — CYANOCOBALAMIN 1000 MCG/ML IJ SOLN
1000.0000 ug | Freq: Once | INTRAMUSCULAR | Status: AC
  Administered 2023-07-20: 1000 ug via INTRAMUSCULAR

## 2023-07-20 NOTE — Progress Notes (Addendum)
 Pt here for monthly B12 injection per Randie Bustle, MD  B12 1000mcg given IM, and pt tolerated injection well.   Next B12 injection scheduled for 1 month.      Please cosign as provider in office of injection given.

## 2023-09-14 ENCOUNTER — Encounter: Payer: Self-pay | Admitting: Medical

## 2023-09-14 ENCOUNTER — Ambulatory Visit: Admitting: Medical

## 2023-09-14 VITALS — BP 110/70 | HR 88 | Resp 18 | Ht 68.0 in | Wt 189.8 lb

## 2023-09-14 DIAGNOSIS — R5383 Other fatigue: Secondary | ICD-10-CM | POA: Diagnosis not present

## 2023-09-14 DIAGNOSIS — S46812A Strain of other muscles, fascia and tendons at shoulder and upper arm level, left arm, initial encounter: Secondary | ICD-10-CM

## 2023-09-14 DIAGNOSIS — E538 Deficiency of other specified B group vitamins: Secondary | ICD-10-CM | POA: Diagnosis not present

## 2023-09-14 DIAGNOSIS — E559 Vitamin D deficiency, unspecified: Secondary | ICD-10-CM | POA: Diagnosis not present

## 2023-09-14 LAB — CBC WITH DIFFERENTIAL/PLATELET
Basophils Absolute: 0 K/uL (ref 0.0–0.1)
Basophils Relative: 0.6 % (ref 0.0–3.0)
Eosinophils Absolute: 0.1 K/uL (ref 0.0–0.7)
Eosinophils Relative: 3 % (ref 0.0–5.0)
HCT: 45.1 % (ref 39.0–52.0)
Hemoglobin: 15.5 g/dL (ref 13.0–17.0)
Lymphocytes Relative: 35.2 % (ref 12.0–46.0)
Lymphs Abs: 1.7 K/uL (ref 0.7–4.0)
MCHC: 34.5 g/dL (ref 30.0–36.0)
MCV: 86.7 fl (ref 78.0–100.0)
Monocytes Absolute: 0.5 K/uL (ref 0.1–1.0)
Monocytes Relative: 9.7 % (ref 3.0–12.0)
Neutro Abs: 2.4 K/uL (ref 1.4–7.7)
Neutrophils Relative %: 51.5 % (ref 43.0–77.0)
Platelets: 260 K/uL (ref 150.0–400.0)
RBC: 5.19 Mil/uL (ref 4.22–5.81)
RDW: 12.9 % (ref 11.5–15.5)
WBC: 4.8 K/uL (ref 4.0–10.5)

## 2023-09-14 LAB — TSH: TSH: 1.1 u[IU]/mL (ref 0.35–5.50)

## 2023-09-14 LAB — VITAMIN D 25 HYDROXY (VIT D DEFICIENCY, FRACTURES): VITD: 22.14 ng/mL — ABNORMAL LOW (ref 30.00–100.00)

## 2023-09-14 LAB — VITAMIN B12: Vitamin B-12: 293 pg/mL (ref 211–911)

## 2023-09-14 LAB — T4, FREE: Free T4: 0.85 ng/dL (ref 0.60–1.60)

## 2023-09-14 NOTE — Patient Instructions (Addendum)
 Fatigue Fatigue possibly linked to overtime work and insufficient rest. Previous B12 injections were beneficial. B12 levels to be evaluated for supplementation need. - Order B12 level. - Advise on B12 supplementation based on results. -also get cbc, tsh and t4.  Musculoskeletal pain(trapezius and pectoralis(left upper pec area) Pain in trapezius and pectoralis muscles likely muscular due to heavy bag and stress. Responds to Tylenol . Cardiac cause unlikely with normal EKG in 2022, abscense cardiac like pain and low risk factors. Discussed chiropractic care benefits. - Continue Tylenol . - Consider low-dose ibuprofen  with Tylenol . - Consider chiropractic care if affordable. -if pain features change such as at rest pec pain or other features then get repeat ekg and consider refer to cardiologist.  Vitamin D  deficiency Low vitamin D  levels noted in February. High-dose vitamin D  was advised but now finished.. Current status to be assessed with new levels. - Order vitamin D  level. - Advise on vitamin D  supplementation based on results.  General Health Maintenance Weight loss of six pounds since February with low-carb diet. Aids in controlling blood pressure, blood sugar, and cholesterol. Current management is effective. - Encourage continued weight loss and healthy eating. - Monitor cardiovascular risk factors.  Follow-up Follow-up to be determined by lab results. - Determine follow-up date based on lab results.

## 2023-09-14 NOTE — Progress Notes (Signed)
 Subjective:    Patient ID: Jared Myers, male    DOB: 1980-06-13, 43 y.o.   MRN: [id number]  HPI  Jared Myers is a 43 year old male who presents with fatigue and musculoskeletal pain.  He has been experiencing fatigue, which he partly attributes to working overtime, including every other Sunday. He completed a series of B12 injections from May to June, which initially improved his symptoms. However, he has been feeling lethargic again, possibly due to his work schedule. He has not taken any over-the-counter B12 supplements since his last injection a month ago.  He mentions a history of low vitamin D  levels identified in February, but he does not recall completing the recommended eight-week course of high-dose vitamin D . He is unsure if he received a prescription for this treatment.  He has been experiencing musculoskeletal pain, particularly in the trapezius and left pectoral region. The pain is associated with carrying a heavy bag daily. The pain is present in the mornings and can fluctuate throughout the day depending on his activities. The pain is alleviated by Tylenol , which provides relief for six to eight hours. No current pain in the pectoral region unless it is pressed or moved. He reports tenderness in the trapezius area, which is exacerbated by carrying his bag.  He has made lifestyle changes, including adopting a low-carb diet inspired by his mother's weight loss journey. Since February, he has lost six pounds and is now drinking three to four bottles of water daily. He avoids late-night snacks and is mindful of his portion sizes.       Review of Systems  Constitutional:  Positive for fatigue. Negative for chills and fever.  HENT:  Negative for dental problem.   Respiratory:  Negative for cough, chest tightness, shortness of breath and wheezing.   Cardiovascular:  Negative for chest pain and palpitations.  Gastrointestinal:  Negative for abdominal pain and nausea.   Genitourinary:  Negative for dysuria and frequency.  Musculoskeletal:  Negative for back pain and joint swelling.       Trapezius myalgia  Skin:  Negative for rash.  Neurological:  Negative for dizziness, speech difficulty and light-headedness.  Hematological:  Negative for adenopathy. Does not bruise/bleed easily.  Psychiatric/Behavioral:  Negative for behavioral problems and dysphoric mood.     Past Medical History:  Diagnosis Date   Anxiety in acute stress reaction 01/06/2011   Constipation 12/18/2013   Hyperglycemia 09/28/2014   Hyperlipidemia, mixed 06/22/2014   Infectious mononucleosis 07/18/2013   Inguinal hernia 05/12/2012   Low back pain 12/16/2013   Overweight 06/22/2014   Pelvic floor dysfunction    Premature ventricular beat    Splenomegaly 07/18/2013   Tremulousness 08/14/2013     Social History   Socioeconomic History   Marital status: Married    Spouse name: Not on file   Number of children: Not on file   Years of education: Not on file   Highest education level: Some college, no degree  Occupational History   Not on file  Tobacco Use   Smoking status: Never   Smokeless tobacco: Never  Substance and Sexual Activity   Alcohol use: No   Drug use: No   Sexual activity: Yes    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, works at post office, 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  Other Topics Concern   Not on file  Social History Narrative   New baby on the way 07/15/16   Wife is 13 weeks  Social Drivers of Corporate investment banker Strain: Low Risk  (09/12/2023)   Overall Financial Resource Strain (CARDIA)    Difficulty of Paying Living Expenses: Not hard at all  Food Insecurity: No Food Insecurity (09/12/2023)   Hunger Vital Sign    Worried About Running Out of Food in the Last Year: Never true    Ran Out of Food in the Last Year: Never true  Transportation Needs: No Transportation Needs (09/12/2023)   PRAPARE - Administrator, Civil Service (Medical): No     Lack of Transportation (Non-Medical): No  Physical Activity: Insufficiently Active (09/12/2023)   Exercise Vital Sign    Days of Exercise per Week: 1 day    Minutes of Exercise per Session: 20 min  Stress: No Stress Concern Present (09/12/2023)   [employer] of Occupational Health - Occupational Stress Questionnaire    Feeling of Stress: Not at all  Social Connections: Socially Integrated (09/12/2023)   Social Connection and Isolation Panel    Frequency of Communication with Friends and Family: More than three times a week    Frequency of Social Gatherings with Friends and Family: Twice a week    Attends Religious Services: More than 4 times per year    Active Member of [employer] or Organizations: Yes    Attends Engineer, structural: More than 4 times per year    Marital Status: Married  Catering manager Violence: Not on file    Past Surgical History:  Procedure Laterality Date   NO PAST SURGERIES      Family History  Problem Relation Age of Onset   Cancer Mother        Non Hodkins lymphoma, breast cancer   Other Father        pituatary adenoma   Cancer Paternal Uncle        recurrent prostate    Heart disease Maternal Grandmother        chf   Heart disease Maternal Grandfather        chf   Heart disease Paternal Grandfather        chf   Depression Brother    Anemia Sister     Allergies  Allergen Reactions   Guaifenesin      REACTION: PVCs   Prednisone      PVC    Current Outpatient Medications on File Prior to Visit  Medication Sig Dispense Refill   benzonatate  (TESSALON ) 100 MG capsule Take 1 capsule (100 mg total) by mouth 3 (three) times daily as needed for cough. 30 capsule 0   ondansetron  (ZOFRAN -ODT) 4 MG disintegrating tablet Take 1 tablet (4 mg total) by mouth every 8 (eight) hours as needed for nausea or vomiting. 20 tablet 0   promethazine  (PHENERGAN ) 25 MG tablet Take 1 tablet (25 mg total) by mouth every 8 (eight) hours as needed for nausea or  vomiting. 20 tablet 2   SUMAtriptan  (IMITREX ) 50 MG tablet Take 1 tablet (50 mg total) by mouth every 2 (two) hours as needed for migraine. May repeat 1 time in 24 hours 10 tablet 5   tiZANidine  (ZANAFLEX ) 4 MG tablet Take 1 tablet (4 mg total) by mouth every 8 (eight) hours as needed for muscle spasms. 60 tablet 3   Vitamin D , Ergocalciferol , (DRISDOL ) 1.25 MG (50000 UNIT) CAPS capsule Take 1 capsule (50,000 Units total) by mouth every 7 (seven) days. 8 capsule 0   Current Facility-Administered Medications on File Prior to Visit  Medication Dose Route  Frequency Provider Last Rate Last Admin   cyanocobalamin  (VITAMIN B12) injection 1,000 mcg  1,000 mcg Intramuscular Q30 days Dorina Loving, PA-C   1,000 mcg at 06/15/23 1040    BP 110/70   Pulse 88   Resp 18   Ht 5' 8 (1.727 m)   Wt 189 lb 12.8 oz (86.1 kg)   SpO2 95%   BMI 28.86 kg/m          Objective:   Physical Exam  General Mental Status- Alert. General Appearance- Not in acute distress.   Skin General: Color- Normal Color. Moisture- Normal Moisture.  Neck Left side trapezius tenderness to palpation.   Chest and Lung Exam Auscultation: Breath Sounds:-CTA  Cardiovascular Auscultation:Rythm- RRR Murmurs & Other Heart Sounds:Auscultation of the heart reveals- No Murmurs.  Abdomen Inspection:-Inspeection Normal. Palpation/Percussion:Note:No mass. Palpation and Percussion of the abdomen reveal- Non Tender, Non Distended + BS, no rebound or guarding.  Neurologic Cranial Nerve exam:- CN III-XII intact(No nystagmus), symmetric smile. Strength:- 5/5 equal and symmetric strength both upper and lower extremities.   Anterior thorax- left upper pectoralis area pain on palpation.     Assessment & Plan:   Patient Instructions  Fatigue Fatigue possibly linked to overtime work and insufficient rest. Previous B12 injections were beneficial. B12 levels to be evaluated for supplementation need. - Order B12 level. -  Advise on B12 supplementation based on results.  Musculoskeletal pain(trapezius and pectoralis(left upper pec area) Pain in trapezius and pectoralis muscles likely muscular due to heavy bag and stress. Responds to Tylenol . Cardiac cause unlikely with normal EKG in 2022, abscense cardiac like pain and low risk factors. Discussed chiropractic care benefits. - Continue Tylenol . - Consider low-dose ibuprofen  with Tylenol . - Consider chiropractic care if affordable. -if pain features change such as at rest pec pain or other features then get repeat ekg and consider refer to cardiologist.  Vitamin D  deficiency Low vitamin D  levels noted in February. High-dose vitamin D  was advised but now finished.. Current status to be assessed with new levels. - Order vitamin D  level. - Advise on vitamin D  supplementation based on results.  General Health Maintenance Weight loss of six pounds since February with low-carb diet. Aids in controlling blood pressure, blood sugar, and cholesterol. Current management is effective. - Encourage continued weight loss and healthy eating. - Monitor cardiovascular risk factors.  Follow-up Follow-up to be determined by lab results. - Determine follow-up date based on lab results.   Labron Bloodgood, PA-C

## 2023-09-16 ENCOUNTER — Other Ambulatory Visit (HOSPITAL_BASED_OUTPATIENT_CLINIC_OR_DEPARTMENT_OTHER): Payer: Self-pay

## 2023-09-16 ENCOUNTER — Ambulatory Visit: Payer: Self-pay | Admitting: Medical

## 2023-09-16 MED ORDER — VITAMIN D (ERGOCALCIFEROL) 1.25 MG (50000 UNIT) PO CAPS
50000.0000 [IU] | ORAL_CAPSULE | ORAL | 0 refills | Status: DC
  Filled 2023-09-16: qty 8, 56d supply, fill #0

## 2023-09-16 NOTE — Addendum Note (Signed)
 Addended by: DORINA DALLAS HERO on: 09/16/2023 06:17 AM   Modules accepted: Orders

## 2023-11-10 ENCOUNTER — Telehealth: Payer: Self-pay

## 2023-11-10 NOTE — Telephone Encounter (Signed)
 Patient was advised via message per his request.

## 2023-11-10 NOTE — Telephone Encounter (Signed)
 Copied from CRM [id number]. Topic: General - Other >> Nov 10, 2023  8:31 AM Rosina BIRCH wrote: Reason for CRM: patient called stating he went to the urgent care over the weekend and they put him on prednisone  and he received a steroid shot. Patient want to clarify if he is able to take one pill instead of taking two as prescribed because it can cause pvc  [phone] can leave a message

## 2023-11-11 ENCOUNTER — Other Ambulatory Visit (HOSPITAL_BASED_OUTPATIENT_CLINIC_OR_DEPARTMENT_OTHER): Payer: Self-pay

## 2023-11-11 ENCOUNTER — Telehealth (INDEPENDENT_AMBULATORY_CARE_PROVIDER_SITE_OTHER): Admitting: Medical

## 2023-11-11 DIAGNOSIS — J029 Acute pharyngitis, unspecified: Secondary | ICD-10-CM

## 2023-11-11 DIAGNOSIS — J302 Other seasonal allergic rhinitis: Secondary | ICD-10-CM

## 2023-11-11 DIAGNOSIS — J0110 Acute frontal sinusitis, unspecified: Secondary | ICD-10-CM | POA: Diagnosis not present

## 2023-11-11 DIAGNOSIS — E538 Deficiency of other specified B group vitamins: Secondary | ICD-10-CM

## 2023-11-11 DIAGNOSIS — E559 Vitamin D deficiency, unspecified: Secondary | ICD-10-CM

## 2023-11-11 MED ORDER — FLUTICASONE PROPIONATE 50 MCG/ACT NA SUSP
2.0000 | Freq: Every day | NASAL | 1 refills | Status: AC
  Filled 2023-11-11: qty 16, 30d supply, fill #0

## 2023-11-11 MED ORDER — AZELASTINE HCL 0.1 % NA SOLN
2.0000 | Freq: Two times a day (BID) | NASAL | 0 refills | Status: AC
  Filled 2023-11-11: qty 30, 50d supply, fill #0

## 2023-11-11 MED ORDER — AZITHROMYCIN 250 MG PO TABS
ORAL_TABLET | ORAL | 0 refills | Status: AC
  Filled 2023-11-11: qty 6, 5d supply, fill #0

## 2023-11-11 NOTE — Progress Notes (Signed)
 Virtual Visit via Video Note  I connected with Jared Myers on 11/11/23 at 10:40 AM EDT by a video enabled telemedicine application and verified that I am speaking with the correct person using two identifiers.  Location: Patient: Work in [employer]. Provider: office [place]   I discussed the limitations of evaluation and management by telemedicine and the availability of in person appointments. The patient expressed understanding and agreed to proceed.  History of Present Illness: Discussed the use of AI scribe software for clinical note transcription with the patient, who gave verbal consent to proceed.  History of Present Illness         Jared Myers is a 43 year old male with a history of sinus infections who presents with worsening throat soreness and sinus congestion.  His symptoms began gradually a week ago and worsened by last Friday, characterized by a burning throat and nasal congestion. He used Flonase , which was ineffective. He visited urgent care on Saturday, received a steroid injection, and was prescribed prednisone  though told would give antibiotic. He has not started prednisone  tabs yet.SABRA His symptoms temporarily improved for a church performance on Sunday but worsened afterward.  Currently, he experiences worsening throat soreness, postnasal drip, and sinus congestion, with headaches starting yesterday. He tested negative for COVID-19 and strep throat at urgent care. His sinus pain has increased since the urgent care visit.  He has a history of sinus infections, particularly during allergy season, and experiences recurrent symptoms annually. He is currently using Flonase , which is slightly expired, but has not used Astelin  nasal spray before.  He is managing vitamin D  deficiency with 50,000 IU weekly and plans to start vitamin B12 supplementation at 2000 mcg daily. He has two vitamin D  tablets remaining and will schedule lab work after completing them.  He is actively  losing weight purposefully, currently in the 180s, and is monitoring his diet by reducing carbohydrate intake.     Observations/Objective: General-no acute distress, pleasant, oriented. Lungs- on inspection lungs appear unlabored. Neck- no tracheal deviation or jvd on inspection. Neuro- gross motor function appears intact.  Assessment and Plan: Assessment and Plan          Patient Instructions  Acute sinusitis and pharyngitis with seasonal allergic rhinitis Symptoms persisted over a week with worsening sore throat, postnasal drip, and sinus pain. Negative COVID-19 and strep tests. Previous steroid injection provided temporary relief. Current symptoms suggest sinus infection exacerbated by allergies. - Prescribe azithromycin  pack for suspected bacterial sinus infection. - Refill Flonase  nasal spray. - Prescribe Astelin  nasal spray to be used with Flonase . - Advise against additional prednisone .  Vitamin D  deficiency Currently on 50,000 IU vitamin D  weekly with two doses remaining. Plan to reassess levels after completion. - Schedule lab work for vitamin D  level one week after completing the last dose.  Vitamin B12 deficiency Previously noted to be on the lower end of normal. No current supplementation. Plan to reassess levels after initiating supplementation. - Advise starting vitamin B12 supplementation at 2000 mcg daily. - Schedule lab work for vitamin B12 level one week after completing the last dose of vitamin D .  Follow up 10-14 days or sooner if needed   Mckinnon Glick, PA-C     Follow Up Instructions:    I discussed the assessment and treatment plan with the patient. The patient was provided an opportunity to ask questions and all were answered. The patient agreed with the plan and demonstrated an understanding of the instructions.  The patient was advised to call back or seek an in-person evaluation if the symptoms worsen or if the condition fails to improve as  anticipated.   Cam Harnden, PA-C

## 2023-11-11 NOTE — Patient Instructions (Signed)
 Acute sinusitis and pharyngitis with seasonal allergic rhinitis Symptoms persisted over a week with worsening sore throat, postnasal drip, and sinus pain. Negative COVID-19 and strep tests. Previous steroid injection provided temporary relief. Current symptoms suggest sinus infection exacerbated by allergies. - Prescribe azithromycin  pack for suspected bacterial sinus infection. - Refill Flonase  nasal spray. - Prescribe Astelin  nasal spray to be used with Flonase . - Advise against additional prednisone .  Vitamin D  deficiency Currently on 50,000 IU vitamin D  weekly with two doses remaining. Plan to reassess levels after completion. - Schedule lab work for vitamin D  level one week after completing the last dose.  Vitamin B12 deficiency Previously noted to be on the lower end of normal. No current supplementation. Plan to reassess levels after initiating supplementation. - Advise starting vitamin B12 supplementation at 2000 mcg daily. - Schedule lab work for vitamin B12 level one week after completing the last dose of vitamin D .  Follow up 10-14 days or sooner if needed

## 2023-12-14 ENCOUNTER — Ambulatory Visit: Admitting: Medical

## 2023-12-14 ENCOUNTER — Other Ambulatory Visit (HOSPITAL_BASED_OUTPATIENT_CLINIC_OR_DEPARTMENT_OTHER): Payer: Self-pay

## 2023-12-14 VITALS — BP 116/80 | HR 89 | Temp 98.0°F | Resp 15 | Ht 68.0 in | Wt 191.8 lb

## 2023-12-14 DIAGNOSIS — J3489 Other specified disorders of nose and nasal sinuses: Secondary | ICD-10-CM

## 2023-12-14 DIAGNOSIS — H6690 Otitis media, unspecified, unspecified ear: Secondary | ICD-10-CM

## 2023-12-14 DIAGNOSIS — Z23 Encounter for immunization: Secondary | ICD-10-CM

## 2023-12-14 DIAGNOSIS — R0789 Other chest pain: Secondary | ICD-10-CM

## 2023-12-14 DIAGNOSIS — E782 Mixed hyperlipidemia: Secondary | ICD-10-CM | POA: Diagnosis not present

## 2023-12-14 DIAGNOSIS — R0981 Nasal congestion: Secondary | ICD-10-CM | POA: Diagnosis not present

## 2023-12-14 DIAGNOSIS — J029 Acute pharyngitis, unspecified: Secondary | ICD-10-CM | POA: Diagnosis not present

## 2023-12-14 DIAGNOSIS — E538 Deficiency of other specified B group vitamins: Secondary | ICD-10-CM

## 2023-12-14 DIAGNOSIS — J302 Other seasonal allergic rhinitis: Secondary | ICD-10-CM

## 2023-12-14 DIAGNOSIS — E559 Vitamin D deficiency, unspecified: Secondary | ICD-10-CM

## 2023-12-14 MED ORDER — METHYLPREDNISOLONE 4 MG PO TABS
ORAL_TABLET | ORAL | 0 refills | Status: DC
  Filled 2023-12-14: qty 10, 4d supply, fill #0

## 2023-12-14 MED ORDER — CEFDINIR 300 MG PO CAPS
300.0000 mg | ORAL_CAPSULE | Freq: Two times a day (BID) | ORAL | 0 refills | Status: DC
  Filled 2023-12-14: qty 20, 10d supply, fill #0

## 2023-12-14 NOTE — Progress Notes (Signed)
 Subjective:    Patient ID: Jared Myers, male    DOB: [birth year], 43 y.o.   MRN: [id number]  HPI  [name] is a 43 year old male who presents with nasal congestion and atypical chest pain.  He has been experiencing nasal congestion and waking up with a sore throat every morning, with the sore throat being worse yesterday compared to today. He has been using Theraflu, Nyquil, and DayQuil intermittently over the past three days but decided to take a break from these medications. He also reports having two headaches in the last three days, which are located in the frontal  sinus region and cause nausea. He has been using Flonase  and Astelin  nasal sprays, although not consistently every day, and mentions that Astelin  causes a burning sensation in his nose.  He has a history of sinus issues, which were previously addressed in a telemedicine visit in early September. The symptoms had resolved but recurred about a week and a half ago. His children have also been sick recently.  He describes experiencing left scapula areaa pain that radiates to his chest, which he attributes to stress and physical activities such as yard work. His ex-wife's suicide in July has been a source of stress. The pain is located near the scapula and sometimes radiates to the chest, described as a sharp, shooting pain that is brief and resolves spontaneously. The pain occurs every other day and is sometimes associated with sleeping positions, particularly when his dog sleeps beside him. He has a history of chronic shoulder pain but notes that this current pain is more in the scapula region.  He has a family history of heart disease, with his grandmother having had open heart surgery in her seventies and an uncle who had a heart attack. His lipid panel is slightly elevated, but he does not smoke and drinks alcohol infrequently.       Review of Systems  Constitutional:  Negative for chills, fatigue and fever.   HENT:  Negative for dental problem and ear pain.   Respiratory:  Negative for cough, chest tightness, shortness of breath and wheezing.        Atypical chest pain  Cardiovascular:  Negative for chest pain and palpitations.  Gastrointestinal:  Negative for abdominal pain, blood in stool and nausea.  Genitourinary:  Negative for dysuria, genital sores and urgency.  Musculoskeletal:  Negative for back pain and myalgias.       Scapula pain.  Skin:  Negative for rash.  Neurological:  Negative for dizziness, speech difficulty, weakness and light-headedness.  Hematological:  Negative for adenopathy.  Psychiatric/Behavioral:  Negative for confusion and dysphoric mood.     Past Medical History:  Diagnosis Date   Anxiety in acute stress reaction 01/06/2011   Constipation 12/18/2013   Hyperglycemia 09/28/2014   Hyperlipidemia, mixed 06/22/2014   Infectious mononucleosis 07/18/2013   Inguinal hernia 05/12/2012   Low back pain 12/16/2013   Overweight 06/22/2014   Pelvic floor dysfunction    Premature ventricular beat    Splenomegaly 07/18/2013   Tremulousness 08/14/2013     Social History   Socioeconomic History   Marital status: Married    Spouse name: Not on file   Number of children: Not on file   Years of education: Not on file   Highest education level: Some college, no degree  Occupational History   Not on file  Tobacco Use   Smoking status: Never   Smokeless tobacco: Never  Substance and  Sexual Activity   Alcohol use: No   Drug use: No   Sexual activity: Yes    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, works at post [employer], 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  Other Topics Concern   Not on file  Social History Narrative   New baby on the way 07/15/16   Wife is 13 weeks   Social Drivers of Corporate investment banker Strain: Low Risk  (09/12/2023)   Overall Financial Resource Strain (CARDIA)    Difficulty of Paying Living Expenses: Not hard at all  Food Insecurity: No Food Insecurity  (09/12/2023)   Hunger Vital Sign    Worried About Running Out of Food in the Last Year: Never true    Ran Out of Food in the Last Year: Never true  Transportation Needs: No Transportation Needs (09/12/2023)   PRAPARE - Administrator, Civil Service (Medical): No    Lack of Transportation (Non-Medical): No  Physical Activity: Insufficiently Active (09/12/2023)   Exercise Vital Sign    Days of Exercise per Week: 1 day    Minutes of Exercise per Session: 20 min  Stress: No Stress Concern Present (09/12/2023)   [employer] of Occupational Health - Occupational Stress Questionnaire    Feeling of Stress: Not at all  Social Connections: Socially Integrated (09/12/2023)   Social Connection and Isolation Panel    Frequency of Communication with Friends and Family: More than three times a week    Frequency of Social Gatherings with Friends and Family: Twice a week    Attends Religious Services: More than 4 times per year    Active Member of [employer] or Organizations: Yes    Attends Engineer, structural: More than 4 times per year    Marital Status: Married  Catering manager Violence: Not on file    Past Surgical History:  Procedure Laterality Date   NO PAST SURGERIES      Family History  Problem Relation Age of Onset   Cancer Mother        Non Hodkins lymphoma, breast cancer   Other Father        pituatary adenoma   Cancer Paternal Uncle        recurrent prostate    Heart disease Maternal Grandmother        chf   Heart disease Maternal Grandfather        chf   Heart disease Paternal Grandfather        chf   Depression Brother    Anemia Sister     Allergies  Allergen Reactions   Amoxicillin  Swelling   Guaifenesin      REACTION: PVCs   Prednisone      PVC    Current Outpatient Medications on File Prior to Visit  Medication Sig Dispense Refill   azelastine  (ASTELIN ) 0.1 % nasal spray Place 2 sprays into both nostrils 2 (two) times daily. Use in each nostril  as directed 30 mL 0   fluticasone  (FLONASE ) 50 MCG/ACT nasal spray Place 2 sprays into both nostrils daily. 16 g 1   ondansetron  (ZOFRAN -ODT) 4 MG disintegrating tablet Take 1 tablet (4 mg total) by mouth every 8 (eight) hours as needed for nausea or vomiting. 20 tablet 0   promethazine  (PHENERGAN ) 25 MG tablet Take 1 tablet (25 mg total) by mouth every 8 (eight) hours as needed for nausea or vomiting. 20 tablet 2   SUMAtriptan  (IMITREX ) 50 MG tablet Take 1 tablet (50 mg total) by mouth  every 2 (two) hours as needed for migraine. May repeat 1 time in 24 hours 10 tablet 5   tiZANidine  (ZANAFLEX ) 4 MG tablet Take 1 tablet (4 mg total) by mouth every 8 (eight) hours as needed for muscle spasms. 60 tablet 3   Vitamin D , Ergocalciferol , (DRISDOL ) 1.25 MG (50000 UNIT) CAPS capsule Take 1 capsule (50,000 Units total) by mouth every 7 (seven) days. 8 capsule 0   Current Facility-Administered Medications on File Prior to Visit  Medication Dose Route Frequency Provider Last Rate Last Admin   cyanocobalamin  (VITAMIN B12) injection 1,000 mcg  1,000 mcg Intramuscular Q30 days Tandre Conly, PA-C   1,000 mcg at 06/15/23 1040    BP 116/80   Pulse 89   Temp 98 F (36.7 C) (Oral)   Resp 15   Ht 5' 8 (1.727 m)   Wt 191 lb 12.8 oz (87 kg)   SpO2 96%   BMI 29.16 kg/m          Objective:   Physical Exam  General Mental Status- Alert. General Appearance- Not in acute distress.   Skin General: Color- Normal Color. Moisture- Normal Moisture.  Neck No JVD.  Chest and Lung Exam Auscultation: Breath Sounds:CTA  Cardiovascular Auscultation:Rythm- RRR Murmurs & Other Heart Sounds:Auscultation of the heart reveals- No Murmurs.  Abdomen Inspection:-Inspeection Normal. Palpation/Percussion:Note:No mass. Palpation and Percussion of the abdomen reveal- Non Tender, Non Distended + BS, no rebound or guarding.   Neurologic Cranial Nerve exam:- CN III-XII intact(No nystagmus), symmetric  smile. Strength:- 5/5 equal and symmetric strength both upper and lower extremities.    Left scapula- mild tenderness to palpation.    Heent- sinus pressure over maxillary sinus. +pnd and left tm red. Upper 1/2 portion   Assessment & Plan:   Patient Instructions  Atypical chest pain with left scapular pain Intermittent atypical chest pain with left scapular pain, likely musculoskeletal. Low cardiovascular risk. EKG shows sinus rhythm, no ischemic changes. - Perform EKG today to compare with 2022 EKG. - Medrol  4-day taper - Advise emergency department evaluation for constant severe chest pain. - Consider referral to cardiologist if recurrent chest pain.  Acute left otitis media and acute sinusitis with allergic rhinitis Acute left otitis media and sinusitis with allergic rhinitis. Allergic to amoxicillin , tolerates azithromycin  and cephalosporins. - Prescribe cefdinir  300 mg twice daily for 10 days. - Advise daily use of Flonase  and Astelin  nasal sprays until December. - Refill nasal sprays as needed. - Restart nasal sprays if congestion returns after discontinuation.  Hyperlipidemia Mildly elevated lipid panel, low cardiovascular risk. No smoking or significant alcohol use. - Schedule fasting lipid panel, B12, and vitamin D  tests for next Monday.  Follow up in 2 weeks or sooner if needed    I personally spent a total of 43 minutes in the care of the patient today including performing a medically appropriate exam/evaluation, counseling and educating, placing orders, referring and communicating with other health care professionals, and documenting clinical information in the EHR.

## 2023-12-14 NOTE — Patient Instructions (Addendum)
 Atypical chest pain with left scapular pain Intermittent atypical chest pain with left scapular pain, likely musculoskeletal. Low cardiovascular risk. EKG shows sinus rhythm, no ischemic changes. - Perform EKG today to compare with 2022 EKG. - Medrol  4-day taper - Advise emergency department evaluation for constant severe chest pain. - Consider referral to cardiologist if recurrent chest pain.  Acute left otitis media and acute sinusitis with allergic rhinitis Acute left otitis media and sinusitis with allergic rhinitis. Allergic to amoxicillin , tolerates azithromycin  and cephalosporins. - Prescribe cefdinir  300 mg twice daily for 10 days. - Advise daily use of Flonase  and Astelin  nasal sprays until December. - Refill nasal sprays as needed. - Restart nasal sprays if congestion returns after discontinuation.  Hyperlipidemia Mildly elevated lipid panel, low cardiovascular risk. No smoking or significant alcohol use. - Schedule fasting lipid panel, B12, and vitamin D  tests for next Monday.  Follow up in 2 weeks or sooner if needed

## 2023-12-16 ENCOUNTER — Other Ambulatory Visit (HOSPITAL_BASED_OUTPATIENT_CLINIC_OR_DEPARTMENT_OTHER): Payer: Self-pay

## 2023-12-18 ENCOUNTER — Encounter: Payer: Self-pay | Admitting: Medical

## 2023-12-18 ENCOUNTER — Ambulatory Visit: Payer: Self-pay

## 2023-12-18 ENCOUNTER — Ambulatory Visit: Admitting: Student

## 2023-12-18 ENCOUNTER — Other Ambulatory Visit (HOSPITAL_BASED_OUTPATIENT_CLINIC_OR_DEPARTMENT_OTHER): Payer: Self-pay

## 2023-12-18 ENCOUNTER — Encounter: Payer: Self-pay | Admitting: Student

## 2023-12-18 VITALS — BP 118/72 | HR 78 | Ht 68.0 in | Wt 193.0 lb

## 2023-12-18 DIAGNOSIS — J0121 Acute recurrent ethmoidal sinusitis: Secondary | ICD-10-CM | POA: Diagnosis not present

## 2023-12-18 DIAGNOSIS — J029 Acute pharyngitis, unspecified: Secondary | ICD-10-CM | POA: Diagnosis not present

## 2023-12-18 LAB — POC INFLUENZA A&B (BINAX/QUICKVUE)
Influenza A, POC: NEGATIVE
Influenza B, POC: NEGATIVE

## 2023-12-18 LAB — POC COVID19 BINAXNOW: SARS Coronavirus 2 Ag: NEGATIVE

## 2023-12-18 MED ORDER — CETIRIZINE HCL 10 MG PO TABS
10.0000 mg | ORAL_TABLET | Freq: Every day | ORAL | 11 refills | Status: AC
  Filled 2023-12-18: qty 100, 100d supply, fill #0

## 2023-12-18 MED ORDER — BENZONATATE 100 MG PO CAPS
100.0000 mg | ORAL_CAPSULE | Freq: Three times a day (TID) | ORAL | 0 refills | Status: DC | PRN
  Filled 2023-12-18: qty 30, 10d supply, fill #0

## 2023-12-18 NOTE — Telephone Encounter (Signed)
 FYI Only or Action Required?: Action required by provider: update on patient condition.  Patient was last seen in primary care on 12/14/2023 by Dorina Loving, PA-C.  Called Nurse Triage reporting Sinusitis.  Symptoms began a week ago.  Interventions attempted: Prescription medications: cefdinir , Astelin  spray, Flonase  spray.  Symptoms are: sinus pressure front and back of head; sore throat, nasal congestion; chest congestion causing heaviness at night gradually worsening.  Triage Disposition: See Physician Within 24 Hours  Patient/caregiver understands and will follow disposition?: Yes           Copied from CRM #[id number]. Topic: Clinical - Red Word Triage >> Dec 18, 2023  8:10 AM [place] A wrote: [facility] that prompted transfer to Nurse Triage: Medication is not helping patient symtpoms are getting worse and that he has heaviness on chest at night; Sore throat Reason for Disposition  [1] Taking antibiotic > 72 hours (3 days) AND [2] sinus pain not improved  Answer Assessment - Initial Assessment Questions Patient states he feels like he needs a Z pack instead.  1. ANTIBIOTIC: What antibiotic are you taking? How many times a day?     Cefdinir . Denies missing any doses. Twice daily.  2. ONSET: When was the antibiotic started?     Monday night 12/14/23.  3. PAIN: How bad is the pain?   (Scale 0-10; or none, mild, moderate or severe)     5/10.  4. FEVER: Do you have a fever? If Yes, ask: What is it, how was it measured, and when did it start?      He has not checked his temperature. He states he does not feel feverish.  5. SYMPTOMS: Are there any other symptoms you're concerned about? If Yes, ask: When did it start?     He states it feels like his nasal congestion has gotten thicker and stopped up. He states there is a heaviness from the congestion on his chest at night which makes it hard to sleep. Front and back of head hurt. Sore throat; states it felt  like it was on fire. He adds it feels harder to swallow. Denies drooling or difficulty swallowing saliva or liquids; difficulty breathing.   6. PREGNANCY: Is there any chance you are pregnant? When was your last menstrual period?     N/A.  Protocols used: Sinus Infection on Antibiotic Follow-up Call-[facility]

## 2023-12-18 NOTE — Progress Notes (Signed)
 Chief Complaint  Patient presents with   Sinus Problem    Onset 12/11/2023 Congestion and bad sore throat    Jared Myers here for URI complaints.    History of Present Illness Jared Myers is a 43 year old male with a history of sinus issues who presents with worsening sinus symptoms.  Hx sinus infections.  Seen twice in past 6 weeks for sinus issues- was given Azithromycin  and most recently Cefdinir  on 10/6. Reports no improvement.   He has been experiencing mild sinus pressure, chest heaviness, neck pain, and a sore throat since last week, with significant worsening by yesterday. He started cefdinir  on Monday night but has not seen improvement. He has a cough, sometimes productive, and significant nasal congestion but no shortness of breath. He experiences difficulty sleeping due to cough and wakes up early. He feels achy.  He has a history of allergies affecting him particularly in spring and fall. He uses nasal sprays like Flonase  and azelastine . He cannot take Mucinex , reports it causes PVCs.   Patient denies fever, chills, SOB, CP, palpitations, dyspnea, edema, HA, vision changes, N/V/D, abdominal pain, urinary symptoms, rash, weight changes, and recent illness or hospitalizations.      Past Medical History:  Diagnosis Date   Anxiety in acute stress reaction 01/06/2011   Constipation 12/18/2013   Hyperglycemia 09/28/2014   Hyperlipidemia, mixed 06/22/2014   Infectious mononucleosis 07/18/2013   Inguinal hernia 05/12/2012   Low back pain 12/16/2013   Overweight 06/22/2014   Pelvic floor dysfunction    Premature ventricular beat    Splenomegaly 07/18/2013   Tremulousness 08/14/2013    Objective BP 118/72 (BP Location: Left Arm, Patient Position: Sitting, Cuff Size: Normal)   Pulse 78   Ht 5' 8 (1.727 m)   Wt 193 lb (87.5 kg)   SpO2 97%   BMI 29.35 kg/m  General: Awake, alert, appears stated age HEENT: AT, [place], ears patent b/l and TM's neg, nares patent w/o  discharge, pharynx pink and without exudates, MMM Neck: No masses or asymmetry Heart: RRR Lungs: CTAB, no accessory muscle use Psych: Age appropriate judgment and insight, normal mood and affect  Acute recurrent ethmoidal sinusitis - Plan: benzonatate  (TESSALON ) 100 MG capsule, cetirizine  (ZYRTEC ) 10 MG tablet  Assessment and Plan Acute viral upper respiratory infection with sinus involvement Symptoms suggest viral or allergy etiology. COVID and flu tests negative. Antibiotic resistance a concern, do not want a third antibiotic course. Advise watchful waiting, management of symptoms. - Finish cefdinir  course. - Use saline nasal sprays and neti pots. - Hydrate adequately. - Use Chloraseptic spray and warm gargles. - Prescribe Tessalon  Perles for cough, up to three times daily.  Allergic rhinitis Chronic sinus issues likely worsened by seasonal allergies.  Mucinex  causes PVCs per pt. - Start Zyrtec , one tablet daily - Continue Flonase  and azelastine  nasal sprays.  Continue to push fluids, practice good hand hygiene, cover mouth when coughing. F/u prn. If starting to experience fevers, shaking, or shortness of breath, seek immediate care. Pt voiced understanding and agreement to the plan.  Jared LITTIE Jolly, DNP, AGNP-C 12/18/23 3:52 PM

## 2023-12-18 NOTE — Telephone Encounter (Signed)
 Spoke with pt, pt is aware and expressed understanding.

## 2023-12-18 NOTE — Addendum Note (Signed)
 Addended by: Joanne Salah M on: 12/18/2023 03:59 PM   Modules accepted: Orders

## 2023-12-21 ENCOUNTER — Ambulatory Visit: Payer: Self-pay | Admitting: Medical

## 2023-12-21 ENCOUNTER — Other Ambulatory Visit (INDEPENDENT_AMBULATORY_CARE_PROVIDER_SITE_OTHER)

## 2023-12-21 DIAGNOSIS — E559 Vitamin D deficiency, unspecified: Secondary | ICD-10-CM | POA: Diagnosis not present

## 2023-12-21 DIAGNOSIS — E538 Deficiency of other specified B group vitamins: Secondary | ICD-10-CM

## 2023-12-21 DIAGNOSIS — E782 Mixed hyperlipidemia: Secondary | ICD-10-CM | POA: Diagnosis not present

## 2023-12-21 LAB — LIPID PANEL
Cholesterol: 176 mg/dL (ref 0–200)
HDL: 39.7 mg/dL (ref >39.00)
LDL Cholesterol: 113 mg/dL — ABNORMAL HIGH (ref 0–99)
NonHDL: 136.69
Total CHOL/HDL Ratio: 4
Triglycerides: 120 mg/dL (ref 0.0–149.0)
VLDL: 24 mg/dL (ref 0.0–40.0)

## 2023-12-21 LAB — VITAMIN D 25 HYDROXY (VIT D DEFICIENCY, FRACTURES): VITD: 24.79 ng/mL — ABNORMAL LOW (ref 30.00–100.00)

## 2023-12-21 LAB — VITAMIN B12: Vitamin B-12: 460 pg/mL (ref 211–911)

## 2023-12-22 MED ORDER — VITAMIN D (ERGOCALCIFEROL) 1.25 MG (50000 UNIT) PO CAPS
50000.0000 [IU] | ORAL_CAPSULE | ORAL | 1 refills | Status: DC
  Filled 2023-12-22: qty 8, 56d supply, fill #0

## 2023-12-22 NOTE — Addendum Note (Signed)
 Addended by: DORINA DALLAS HERO on: 12/22/2023 08:34 PM   Modules accepted: Orders

## 2023-12-23 ENCOUNTER — Other Ambulatory Visit (HOSPITAL_BASED_OUTPATIENT_CLINIC_OR_DEPARTMENT_OTHER): Payer: Self-pay

## 2023-12-24 ENCOUNTER — Other Ambulatory Visit (HOSPITAL_BASED_OUTPATIENT_CLINIC_OR_DEPARTMENT_OTHER): Payer: Self-pay

## 2023-12-31 ENCOUNTER — Other Ambulatory Visit (HOSPITAL_BASED_OUTPATIENT_CLINIC_OR_DEPARTMENT_OTHER): Payer: Self-pay

## 2024-01-21 ENCOUNTER — Encounter: Payer: Self-pay | Admitting: Family Medicine

## 2024-01-21 DIAGNOSIS — Z808 Family history of malignant neoplasm of other organs or systems: Secondary | ICD-10-CM | POA: Insufficient documentation

## 2024-01-25 ENCOUNTER — Ambulatory Visit: Admitting: Family Medicine

## 2024-01-25 ENCOUNTER — Encounter: Payer: Self-pay | Admitting: Family Medicine

## 2024-01-25 VITALS — BP 123/79 | HR 78 | Temp 98.3°F | Ht 68.0 in | Wt 191.0 lb

## 2024-01-25 DIAGNOSIS — Z0000 Encounter for general adult medical examination without abnormal findings: Secondary | ICD-10-CM | POA: Diagnosis not present

## 2024-01-25 DIAGNOSIS — E559 Vitamin D deficiency, unspecified: Secondary | ICD-10-CM | POA: Diagnosis not present

## 2024-01-25 DIAGNOSIS — E538 Deficiency of other specified B group vitamins: Secondary | ICD-10-CM | POA: Diagnosis not present

## 2024-01-25 NOTE — Progress Notes (Signed)
 Office Note 01/25/2024  CC:  Chief Complaint  Patient presents with   Establish Care   Medical Management of Chronic Issues   HPI:  Jared Myers is a 43 y.o. male who is here to establish/transfer care. Patient's most recent primary MD: Dr. Domenica Old records in [facility] EMR were reviewed prior to or during today's visit.  Jared Myers feels well. He has a history of vitamin B12 deficiency.  He was on vitamin B12 injections up till April of this year.  Since that time he has been on oral B12 1000 mcg twice a day. He also has a history of vitamin D  deficiency.  Has been on 50,000 unit weekly dose of vitamin D  for the last month or so. He says he can feel fatigue and decreased motivation if his vitamin B12 or D are low. Currently he feels good.  He is looking forward to taking his wife and kids to [place] next week.  Past Medical History:  Diagnosis Date   Anxiety in acute stress reaction 01/06/2011   Constipation 12/18/2013   Family history of malignant melanoma 01/21/2024   Hyperglycemia 09/28/2014   Hyperlipidemia, mixed 06/22/2014   Infectious mononucleosis 07/18/2013   Inguinal hernia 05/12/2012   Low back pain 12/16/2013   Overweight 06/22/2014   Pelvic floor dysfunction    Premature ventricular beat    Splenomegaly 07/18/2013   Tremulousness 08/14/2013    Past Surgical History:  Procedure Laterality Date   NO PAST SURGERIES      Family History  Problem Relation Age of Onset   Cancer Mother        Non Hodkins lymphoma, breast cancer, bladder ca   Other Father        pituatary adenoma   Anemia Sister    Depression Brother    Heart disease Maternal Grandmother        chf   Heart disease Maternal Grandfather        chf   Heart disease Paternal Grandfather        chf   Cancer Paternal Uncle        recurrent prostate     Social History   Socioeconomic History   Marital status: Married    Spouse name: Not on file   Number of children: Not on file    Years of education: Not on file   Highest education level: Some college, no degree  Occupational History   Not on file  Tobacco Use   Smoking status: Never   Smokeless tobacco: Never  Substance and Sexual Activity   Alcohol use: No   Drug use: No   Sexual activity: Yes    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   [place] negative, [employer] grad.   No tobacco or alcohol.   Social Drivers of Corporate Investment Banker Strain: Low Risk  (09/12/2023)   Overall Financial Resource Strain (CARDIA)    Difficulty of Paying Living Expenses: Not hard at all  Food Insecurity: No Food Insecurity (09/12/2023)   Hunger Vital Sign    Worried About Running Out of Food in the Last Year: Never true    Ran Out of Food in the Last Year: Never true  Transportation Needs: No Transportation Needs (09/12/2023)   PRAPARE -  Administrator, Civil Service (Medical): No    Lack of Transportation (Non-Medical): No  Physical Activity: Insufficiently Active (09/12/2023)   Exercise Vital Sign    Days of Exercise per Week: 1 day    Minutes of Exercise per Session: 20 min  Stress: No Stress Concern Present (09/12/2023)   [employer] of Occupational Health - Occupational Stress Questionnaire    Feeling of Stress: Not at all  Social Connections: Socially Integrated (09/12/2023)   Social Connection and Isolation Panel    Frequency of Communication with Friends and Family: More than three times a week    Frequency of Social Gatherings with Friends and Family: Twice a week    Attends Religious Services: More than 4 times per year    Active Member of [employer] or Organizations: Yes    Attends Banker Meetings: More than 4 times per year    Marital Status: Married  Catering Manager  Violence: Not on file    Outpatient Encounter Medications as of 01/25/2024  Medication Sig   azelastine  (ASTELIN ) 0.1 % nasal spray Place 2 sprays into both nostrils 2 (two) times daily. Use in each nostril as directed   cetirizine  (ZYRTEC ) 10 MG tablet Take 1 tablet (10 mg total) by mouth daily.   cyanocobalamin  (VITAMIN B12) 1000 MCG tablet Take 1,000 mcg by mouth 2 (two) times daily.   fluticasone  (FLONASE ) 50 MCG/ACT nasal spray Place 2 sprays into both nostrils daily.   ondansetron  (ZOFRAN -ODT) 4 MG disintegrating tablet Take 1 tablet (4 mg total) by mouth every 8 (eight) hours as needed for nausea or vomiting.   tiZANidine  (ZANAFLEX ) 4 MG tablet Take 1 tablet (4 mg total) by mouth every 8 (eight) hours as needed for muscle spasms.   Vitamin D , Ergocalciferol , (DRISDOL ) 1.25 MG (50000 UNIT) CAPS capsule Take 1 capsule (50,000 Units total) by mouth every 7 (seven) days.   [DISCONTINUED] benzonatate  (TESSALON ) 100 MG capsule Take 1 capsule (100 mg total) by mouth 3 (three) times daily as needed.   [DISCONTINUED] cefdinir  (OMNICEF ) 300 MG capsule Take 1 capsule (300 mg total) by mouth 2 (two) times daily.   [DISCONTINUED] methylPREDNISolone  (MEDROL ) 4 MG tablet Take 4 tablets by mouth on day 1, then take 3 tablets on day 2, then 2 tablets on day 3, and 1 tablet on day 4.   [DISCONTINUED] promethazine  (PHENERGAN ) 25 MG tablet Take 1 tablet (25 mg total) by mouth every 8 (eight) hours as needed for nausea or vomiting. (Patient not taking: Reported on 01/25/2024)   [DISCONTINUED] SUMAtriptan  (IMITREX ) 50 MG tablet Take 1 tablet (50 mg total) by mouth every 2 (two) hours as needed for migraine. May repeat 1 time in 24 hours   [DISCONTINUED] Vitamin D , Ergocalciferol , (DRISDOL ) 1.25 MG (50000 UNIT) CAPS capsule Take 1 capsule (50,000 Units total) by mouth every 7 (seven) days.   Facility-Administered Encounter Medications as of 01/25/2024  Medication   cyanocobalamin  (VITAMIN B12) injection 1,000  mcg    Allergies  Allergen Reactions   Amoxicillin  Swelling   Guaifenesin      REACTION: PVCs   Prednisone      PVC    Review of Systems  Constitutional:  Negative for appetite change, chills, fatigue and fever.  HENT:  Negative for congestion, dental problem, ear pain and sore throat.   Eyes:  Negative for discharge, redness and visual disturbance.  Respiratory:  Negative for cough, chest tightness, shortness of breath and wheezing.   Cardiovascular:  Negative for chest pain,  palpitations and leg swelling.  Gastrointestinal:  Negative for abdominal pain, blood in stool, diarrhea, nausea and vomiting.  Genitourinary:  Negative for difficulty urinating, dysuria, flank pain, frequency, hematuria and urgency.  Musculoskeletal:  Negative for arthralgias, back pain, joint swelling, myalgias and neck stiffness.  Skin:  Negative for pallor and rash.  Neurological:  Negative for dizziness, speech difficulty, weakness and headaches.  Hematological:  Negative for adenopathy. Does not bruise/bleed easily.  Psychiatric/Behavioral:  Negative for confusion and sleep disturbance. The patient is not nervous/anxious.    PE; Blood pressure 123/79, pulse 78, temperature 98.3 F (36.8 C), temperature source Oral, height 5' 8 (1.727 m), weight 191 lb (86.6 kg), SpO2 98%.  Physical Exam Body mass index is 29.04 kg/m.  Gen: Alert, well appearing.  Patient is oriented to person, place, time, and situation. AFFECT: pleasant, lucid thought and speech. ENT: Ears: EACs clear, normal epithelium.  TMs with good light reflex and landmarks bilaterally.  Eyes: no injection, icteris, swelling, or exudate.  EOMI, PERRLA. Nose: no drainage or turbinate edema/swelling.  No injection or focal lesion.  Mouth: lips without lesion/swelling.  Oral mucosa pink and moist.  Dentition intact and without obvious caries or gingival swelling.  Oropharynx without erythema, exudate, or swelling.  Neck: supple/nontender.  No LAD,  mass, or TM.  Carotid pulses 2+ bilaterally, without bruits. CV: RRR, no m/r/g.   LUNGS: CTA bilat, nonlabored resps, good aeration in all lung fields. ABD: soft, NT, ND, BS normal.  No hepatospenomegaly or mass.  No bruits. EXT: no clubbing, cyanosis, or edema.  Musculoskeletal: no joint swelling, erythema, warmth, or tenderness.  ROM of all joints intact. Skin - no sores or suspicious lesions or rashes or color changes  Pertinent labs:  Last CBC Lab Results  Component Value Date   WBC 4.8 09/14/2023   HGB 15.5 09/14/2023   HCT 45.1 09/14/2023   MCV 86.7 09/14/2023   MCH 30.1 06/06/2014   RDW 12.9 09/14/2023   PLT 260.0 09/14/2023   Last metabolic panel Lab Results  Component Value Date   GLUCOSE 92 03/05/2023   NA 137 03/05/2023   K 4.2 03/05/2023   CL 97 03/05/2023   CO2 32 03/05/2023   BUN 17 03/05/2023   CREATININE 1.13 03/05/2023   GFR 79.95 03/05/2023   CALCIUM 9.3 03/05/2023   PHOS 3.5 01/24/2014   PROT 7.3 03/05/2023   ALBUMIN 4.5 03/05/2023   BILITOT 1.0 03/05/2023   ALKPHOS 61 03/05/2023   AST 16 03/05/2023   ALT 18 03/05/2023   ANIONGAP 7 06/06/2014   Last lipids Lab Results  Component Value Date   CHOL 176 12/21/2023   HDL 39.70 12/21/2023   LDLCALC 113 (H) 12/21/2023   LDLDIRECT 120.0 07/23/2020   TRIG 120.0 12/21/2023   CHOLHDL 4 12/21/2023   Last hemoglobin A1c Lab Results  Component Value Date   HGBA1C 5.2 09/02/2022   Last thyroid  functions Lab Results  Component Value Date   TSH 1.10 09/14/2023   FREET4 0.85 09/14/2023   Last vitamin D  Lab Results  Component Value Date   VD25OH 24.79 (L) 12/21/2023   Last vitamin B12 and Folate Lab Results  Component Value Date   VITAMINB12 460 12/21/2023   ASSESSMENT AND PLAN:   New patient/transfer care.  #1 health maintenance exam: Reviewed age and gender appropriate health maintenance issues (prudent diet, regular exercise, health risks of tobacco and excessive alcohol, use of  seatbelts, fire alarms in home, use of sunscreen).  Also reviewed age  and gender appropriate health screening as well as vaccine recommendations. Vaccines: all UTD Labs: Lipid panel normal last month.  CBC normal this summer.  Will check complete metabolic panel today. Prostate ca screening: average risk patient= as per latest guidelines, start screening at 67 yrs of age. Colon ca screening: average risk patient= as per latest guidelines, start screening at 45 (he definitely needs a colonoscopy rather than stool-based testing due to his mother's history of breast cancer, lymphoma, and bladder cancer.)  #2 vitamin D  deficiency. He is on 50,000 unit supplement weekly for the last 4 to 6 weeks. I recommended he come in for a lab visit in 2 months to recheck vitamin D  level.  3.  Vitamin B12 deficiency. He is currently on 1000 mcg B12 tab twice a day.  B12 level was 460 about 1 month ago.  An After Visit Summary was printed and given to the patient.  Return in about 1 year (around 01/24/2025) for annual CPE (fasting)  :  lab appt in 2 mo for vit D level.  Signed:  Gerlene Hockey, MD           01/25/2024

## 2024-01-25 NOTE — Patient Instructions (Signed)
 Health Maintenance, Male  Adopting a healthy lifestyle and getting preventive care are important in promoting health and wellness. Ask your health care provider about:  The right schedule for you to have regular tests and exams.  Things you can do on your own to prevent diseases and keep yourself healthy.  What should I know about diet, weight, and exercise?  Eat a healthy diet    Eat a diet that includes plenty of vegetables, fruits, low-fat dairy products, and lean protein.  Do not eat a lot of foods that are high in solid fats, added sugars, or sodium.  Maintain a healthy weight  Body mass index (BMI) is a measurement that can be used to identify possible weight problems. It estimates body fat based on height and weight. Your health care provider can help determine your BMI and help you achieve or maintain a healthy weight.  Get regular exercise  Get regular exercise. This is one of the most important things you can do for your health. Most adults should:  Exercise for at least 150 minutes each week. The exercise should increase your heart rate and make you sweat (moderate-intensity exercise).  Do strengthening exercises at least twice a week. This is in addition to the moderate-intensity exercise.  Spend less time sitting. Even light physical activity can be beneficial.  Watch cholesterol and blood lipids  Have your blood tested for lipids and cholesterol at 43 years of age, then have this test every 5 years.  You may need to have your cholesterol levels checked more often if:  Your lipid or cholesterol levels are high.  You are older than 43 years of age.  You are at high risk for heart disease.  What should I know about cancer screening?  Many types of cancers can be detected early and may often be prevented. Depending on your health history and family history, you may need to have cancer screening at various ages. This may include screening for:  Colorectal cancer.  Prostate cancer.  Skin cancer.  Lung  cancer.  What should I know about heart disease, diabetes, and high blood pressure?  Blood pressure and heart disease  High blood pressure causes heart disease and increases the risk of stroke. This is more likely to develop in people who have high blood pressure readings or are overweight.  Talk with your health care provider about your target blood pressure readings.  Have your blood pressure checked:  Every 3-5 years if you are 24-52 years of age.  Every year if you are 3 years old or older.  If you are between the ages of 60 and 72 and are a current or former smoker, ask your health care provider if you should have a one-time screening for abdominal aortic aneurysm (AAA).  Diabetes  Have regular diabetes screenings. This checks your fasting blood sugar level. Have the screening done:  Once every three years after age 66 if you are at a normal weight and have a low risk for diabetes.  More often and at a younger age if you are overweight or have a high risk for diabetes.  What should I know about preventing infection?  Hepatitis B  If you have a higher risk for hepatitis B, you should be screened for this virus. Talk with your health care provider to find out if you are at risk for hepatitis B infection.  Hepatitis C  Blood testing is recommended for:  Everyone born from [birth year] through 1965.  Anyone  with known risk factors for hepatitis C.  Sexually transmitted infections (STIs)  You should be screened each year for STIs, including gonorrhea and chlamydia, if:  You are sexually active and are younger than 43 years of age.  You are older than 43 years of age and your health care provider tells you that you are at risk for this type of infection.  Your sexual activity has changed since you were last screened, and you are at increased risk for chlamydia or gonorrhea. Ask your health care provider if you are at risk.  Ask your health care provider about whether you are at high risk for HIV. Your health care provider  may recommend a prescription medicine to help prevent HIV infection. If you choose to take medicine to prevent HIV, you should first get tested for HIV. You should then be tested every 3 months for as long as you are taking the medicine.  Follow these instructions at home:  Alcohol use  Do not drink alcohol if your health care provider tells you not to drink.  If you drink alcohol:  Limit how much you have to 0-2 drinks a day.  Know how much alcohol is in your drink. In the [place] one drink equals one 12 oz bottle of beer (355 mL), one 5 oz glass of wine (148 mL), or one 1 oz glass of hard liquor (44 mL).  Lifestyle  Do not use any products that contain nicotine or tobacco. These products include cigarettes, chewing tobacco, and vaping devices, such as e-cigarettes. If you need help quitting, ask your health care provider.  Do not use street drugs.  Do not share needles.  Ask your health care provider for help if you need support or information about quitting drugs.  General instructions  Schedule regular health, dental, and eye exams.  Stay current with your vaccines.  Tell your health care provider if:  You often feel depressed.  You have ever been abused or do not feel safe at home.  Summary  Adopting a healthy lifestyle and getting preventive care are important in promoting health and wellness.  Follow your health care provider's instructions about healthy diet, exercising, and getting tested or screened for diseases.  Follow your health care provider's instructions on monitoring your cholesterol and blood pressure.  This information is not intended to replace advice given to you by your health care provider. Make sure you discuss any questions you have with your health care provider.  Document Revised: 07/16/2020 Document Reviewed: 07/16/2020  Elsevier Patient Education  2024 [employer].

## 2024-01-26 LAB — COMPREHENSIVE METABOLIC PANEL WITH GFR
ALT: 20 U/L (ref 0–53)
AST: 17 U/L (ref 0–37)
Albumin: 4.7 g/dL (ref 3.5–5.2)
Alkaline Phosphatase: 57 U/L (ref 39–117)
BUN: 12 mg/dL (ref 6–23)
CO2: 29 meq/L (ref 19–32)
Calcium: 9.4 mg/dL (ref 8.4–10.5)
Chloride: 101 meq/L (ref 96–112)
Creatinine, Ser: 1.11 mg/dL (ref 0.40–1.50)
GFR: 81.17 mL/min (ref >60.00)
Glucose, Bld: 102 mg/dL — ABNORMAL HIGH (ref 70–99)
Potassium: 4.1 meq/L (ref 3.5–5.1)
Sodium: 139 meq/L (ref 135–145)
Total Bilirubin: 0.7 mg/dL (ref 0.2–1.2)
Total Protein: 7.2 g/dL (ref 6.0–8.3)

## 2024-01-28 ENCOUNTER — Ambulatory Visit: Payer: Self-pay | Admitting: Family Medicine

## 2024-03-28 ENCOUNTER — Other Ambulatory Visit: Payer: Self-pay

## 2024-03-28 ENCOUNTER — Other Ambulatory Visit

## 2024-03-28 DIAGNOSIS — E559 Vitamin D deficiency, unspecified: Secondary | ICD-10-CM | POA: Diagnosis not present

## 2024-03-28 LAB — VITAMIN D 25 HYDROXY (VIT D DEFICIENCY, FRACTURES): VITD: 33.04 ng/mL (ref 30.00–100.00)

## 2024-03-28 MED ORDER — TIZANIDINE HCL 4 MG PO TABS: 4.0000 mg | ORAL_TABLET | Freq: Three times a day (TID) | ORAL | 3 refills | Status: AC | PRN

## 2024-03-29 MED ORDER — VITAMIN D (ERGOCALCIFEROL) 1.25 MG (50000 UNIT) PO CAPS: 50000.0000 [IU] | ORAL_CAPSULE | ORAL | 0 refills | Status: AC

## 2024-06-27 ENCOUNTER — Other Ambulatory Visit

## 2025-01-30 ENCOUNTER — Encounter: Admitting: Family Medicine
